# Patient Record
Sex: Male | Born: 1950 | Race: Black or African American | Hispanic: No | Marital: Single | Smoking: Former smoker
Health system: Southern US, Community
[De-identification: ages and names within clinical notes are randomized; demographics above are authoritative.]

## PROBLEM LIST (undated history)

## (undated) DIAGNOSIS — E119 Type 2 diabetes mellitus without complications: Secondary | ICD-10-CM

## (undated) DIAGNOSIS — J449 Chronic obstructive pulmonary disease, unspecified: Secondary | ICD-10-CM

## (undated) DIAGNOSIS — F1010 Alcohol abuse, uncomplicated: Secondary | ICD-10-CM

## (undated) DIAGNOSIS — I10 Essential (primary) hypertension: Secondary | ICD-10-CM

## (undated) DIAGNOSIS — I509 Heart failure, unspecified: Secondary | ICD-10-CM

## (undated) DIAGNOSIS — F17200 Nicotine dependence, unspecified, uncomplicated: Secondary | ICD-10-CM

## (undated) DIAGNOSIS — I219 Acute myocardial infarction, unspecified: Secondary | ICD-10-CM

## (undated) DIAGNOSIS — R0602 Shortness of breath: Secondary | ICD-10-CM

## (undated) DIAGNOSIS — I739 Peripheral vascular disease, unspecified: Secondary | ICD-10-CM

## (undated) DIAGNOSIS — I499 Cardiac arrhythmia, unspecified: Secondary | ICD-10-CM

## (undated) DIAGNOSIS — I639 Cerebral infarction, unspecified: Secondary | ICD-10-CM

## (undated) DIAGNOSIS — I4891 Unspecified atrial fibrillation: Secondary | ICD-10-CM

## (undated) DIAGNOSIS — B1920 Unspecified viral hepatitis C without hepatic coma: Secondary | ICD-10-CM

## (undated) HISTORY — PX: LEG AMPUTATION BELOW KNEE: SHX694

## (undated) HISTORY — PX: VASCULAR SURGERY: SHX849

## (undated) HISTORY — PX: LEG AMPUTATION ABOVE KNEE: SHX117

---

## 1997-11-26 ENCOUNTER — Other Ambulatory Visit: Admission: RE | Admit: 1997-11-26 | Discharge: 1997-11-26 | Payer: Self-pay | Admitting: Family Medicine

## 1997-12-11 ENCOUNTER — Other Ambulatory Visit: Admission: RE | Admit: 1997-12-11 | Discharge: 1997-12-11 | Payer: Self-pay | Admitting: Family Medicine

## 1998-01-01 ENCOUNTER — Other Ambulatory Visit: Admission: RE | Admit: 1998-01-01 | Discharge: 1998-01-01 | Payer: Self-pay

## 2001-01-17 ENCOUNTER — Emergency Department (HOSPITAL_COMMUNITY): Admission: EM | Admit: 2001-01-17 | Discharge: 2001-01-17 | Payer: Self-pay | Admitting: *Deleted

## 2004-02-26 ENCOUNTER — Ambulatory Visit: Payer: Self-pay | Admitting: Internal Medicine

## 2004-04-05 ENCOUNTER — Ambulatory Visit: Payer: Self-pay | Admitting: Internal Medicine

## 2004-04-07 ENCOUNTER — Emergency Department (HOSPITAL_COMMUNITY): Admission: EM | Admit: 2004-04-07 | Discharge: 2004-04-07 | Payer: Self-pay | Admitting: Emergency Medicine

## 2004-05-18 ENCOUNTER — Ambulatory Visit: Payer: Self-pay | Admitting: Internal Medicine

## 2004-06-02 ENCOUNTER — Ambulatory Visit: Payer: Self-pay | Admitting: Internal Medicine

## 2004-06-28 ENCOUNTER — Ambulatory Visit: Payer: Self-pay | Admitting: Internal Medicine

## 2004-12-27 ENCOUNTER — Ambulatory Visit: Payer: Self-pay | Admitting: Internal Medicine

## 2005-01-03 ENCOUNTER — Ambulatory Visit: Payer: Self-pay | Admitting: Internal Medicine

## 2005-01-11 ENCOUNTER — Ambulatory Visit: Payer: Self-pay | Admitting: *Deleted

## 2005-02-08 ENCOUNTER — Ambulatory Visit: Payer: Self-pay | Admitting: Internal Medicine

## 2005-03-01 ENCOUNTER — Ambulatory Visit: Payer: Self-pay | Admitting: Internal Medicine

## 2005-04-04 ENCOUNTER — Ambulatory Visit: Payer: Self-pay | Admitting: Internal Medicine

## 2005-09-08 ENCOUNTER — Ambulatory Visit: Payer: Self-pay | Admitting: Internal Medicine

## 2005-10-05 ENCOUNTER — Ambulatory Visit: Payer: Self-pay | Admitting: Internal Medicine

## 2005-10-18 ENCOUNTER — Ambulatory Visit: Payer: Self-pay | Admitting: Internal Medicine

## 2005-12-27 ENCOUNTER — Inpatient Hospital Stay (HOSPITAL_COMMUNITY): Admission: EM | Admit: 2005-12-27 | Discharge: 2005-12-30 | Payer: Self-pay | Admitting: Pediatrics

## 2005-12-27 ENCOUNTER — Encounter (INDEPENDENT_AMBULATORY_CARE_PROVIDER_SITE_OTHER): Payer: Self-pay | Admitting: Cardiology

## 2005-12-28 ENCOUNTER — Ambulatory Visit: Payer: Self-pay | Admitting: Internal Medicine

## 2006-01-03 ENCOUNTER — Ambulatory Visit: Payer: Self-pay | Admitting: Internal Medicine

## 2006-03-27 ENCOUNTER — Ambulatory Visit: Payer: Self-pay | Admitting: Internal Medicine

## 2006-03-27 ENCOUNTER — Ambulatory Visit: Payer: Self-pay | Admitting: Emergency Medicine

## 2006-03-27 ENCOUNTER — Ambulatory Visit: Payer: Self-pay | Admitting: Sports Medicine

## 2006-03-27 ENCOUNTER — Inpatient Hospital Stay (HOSPITAL_COMMUNITY): Admission: EM | Admit: 2006-03-27 | Discharge: 2006-04-05 | Payer: Self-pay | Admitting: Emergency Medicine

## 2006-03-28 ENCOUNTER — Encounter (INDEPENDENT_AMBULATORY_CARE_PROVIDER_SITE_OTHER): Payer: Self-pay | Admitting: *Deleted

## 2006-04-18 ENCOUNTER — Ambulatory Visit: Payer: Self-pay | Admitting: Internal Medicine

## 2006-05-02 ENCOUNTER — Ambulatory Visit: Payer: Self-pay | Admitting: Internal Medicine

## 2006-05-16 ENCOUNTER — Ambulatory Visit: Payer: Self-pay | Admitting: Internal Medicine

## 2006-05-19 ENCOUNTER — Ambulatory Visit: Payer: Self-pay | Admitting: Internal Medicine

## 2006-06-02 ENCOUNTER — Ambulatory Visit: Payer: Self-pay | Admitting: Internal Medicine

## 2006-06-06 HISTORY — PX: BELOW KNEE LEG AMPUTATION: SUR23

## 2006-06-06 HISTORY — PX: ABOVE KNEE LEG AMPUTATION: SUR20

## 2006-06-27 ENCOUNTER — Ambulatory Visit: Payer: Self-pay | Admitting: Internal Medicine

## 2006-07-07 ENCOUNTER — Ambulatory Visit: Payer: Self-pay | Admitting: Internal Medicine

## 2006-07-25 ENCOUNTER — Ambulatory Visit: Payer: Self-pay | Admitting: Internal Medicine

## 2006-11-22 DIAGNOSIS — F1010 Alcohol abuse, uncomplicated: Secondary | ICD-10-CM | POA: Insufficient documentation

## 2006-11-22 DIAGNOSIS — F17200 Nicotine dependence, unspecified, uncomplicated: Secondary | ICD-10-CM | POA: Insufficient documentation

## 2006-11-22 DIAGNOSIS — F1410 Cocaine abuse, uncomplicated: Secondary | ICD-10-CM | POA: Insufficient documentation

## 2006-11-22 DIAGNOSIS — I428 Other cardiomyopathies: Secondary | ICD-10-CM | POA: Insufficient documentation

## 2006-12-11 ENCOUNTER — Ambulatory Visit: Payer: Self-pay | Admitting: Internal Medicine

## 2006-12-11 ENCOUNTER — Inpatient Hospital Stay (HOSPITAL_COMMUNITY): Admission: EM | Admit: 2006-12-11 | Discharge: 2006-12-22 | Payer: Self-pay | Admitting: Emergency Medicine

## 2006-12-11 ENCOUNTER — Ambulatory Visit: Payer: Self-pay | Admitting: Cardiology

## 2006-12-12 ENCOUNTER — Encounter (INDEPENDENT_AMBULATORY_CARE_PROVIDER_SITE_OTHER): Payer: Self-pay | Admitting: Emergency Medicine

## 2006-12-13 ENCOUNTER — Encounter (INDEPENDENT_AMBULATORY_CARE_PROVIDER_SITE_OTHER): Payer: Self-pay | Admitting: Neurology

## 2006-12-30 ENCOUNTER — Emergency Department (HOSPITAL_COMMUNITY): Admission: EM | Admit: 2006-12-30 | Discharge: 2006-12-30 | Payer: Self-pay | Admitting: Emergency Medicine

## 2007-04-09 ENCOUNTER — Ambulatory Visit (HOSPITAL_COMMUNITY): Admission: RE | Admit: 2007-04-09 | Discharge: 2007-04-09 | Payer: Self-pay | Admitting: Internal Medicine

## 2007-04-30 ENCOUNTER — Ambulatory Visit: Payer: Self-pay | Admitting: Internal Medicine

## 2007-04-30 LAB — CONVERTED CEMR LAB
ALT: 41 units/L (ref 0–53)
Basophils Absolute: 0 10*3/uL (ref 0.0–0.1)
CO2: 24 meq/L (ref 19–32)
Cholesterol: 167 mg/dL (ref 0–200)
Creatinine, Ser: 0.82 mg/dL (ref 0.40–1.50)
Eosinophils Relative: 2 % (ref 0–5)
HCT: 39 % (ref 39.0–52.0)
Hemoglobin: 13 g/dL (ref 13.0–17.0)
Lymphocytes Relative: 27 % (ref 12–46)
MCHC: 33.3 g/dL (ref 30.0–36.0)
MCV: 89.4 fL (ref 78.0–100.0)
Monocytes Absolute: 1 10*3/uL (ref 0.1–1.0)
RDW: 14.1 % (ref 11.5–15.5)
Total Bilirubin: 0.4 mg/dL (ref 0.3–1.2)
Total CHOL/HDL Ratio: 4.4
Triglycerides: 185 mg/dL — ABNORMAL HIGH (ref <150)
VLDL: 37 mg/dL (ref 0–40)
Vit D, 1,25-Dihydroxy: 18 — ABNORMAL LOW (ref 30–89)

## 2007-05-16 ENCOUNTER — Encounter: Admission: RE | Admit: 2007-05-16 | Discharge: 2007-06-06 | Payer: Self-pay | Admitting: Internal Medicine

## 2007-06-08 ENCOUNTER — Encounter: Admission: RE | Admit: 2007-06-08 | Discharge: 2007-09-06 | Payer: Self-pay | Admitting: Internal Medicine

## 2007-09-13 ENCOUNTER — Ambulatory Visit: Payer: Self-pay | Admitting: Internal Medicine

## 2007-09-13 LAB — CONVERTED CEMR LAB
ALT: 32 units/L (ref 0–53)
AST: 22 units/L (ref 0–37)
Albumin: 4.1 g/dL (ref 3.5–5.2)
Basophils Absolute: 0 10*3/uL (ref 0.0–0.1)
Basophils Relative: 1 % (ref 0–1)
Calcium: 9.7 mg/dL (ref 8.4–10.5)
Chloride: 103 meq/L (ref 96–112)
MCHC: 33.7 g/dL (ref 30.0–36.0)
Neutro Abs: 3.6 10*3/uL (ref 1.7–7.7)
Neutrophils Relative %: 53 % (ref 43–77)
Platelets: 317 10*3/uL (ref 150–400)
Potassium: 3.9 meq/L (ref 3.5–5.3)
RDW: 14.3 % (ref 11.5–15.5)

## 2007-09-25 ENCOUNTER — Ambulatory Visit: Payer: Self-pay | Admitting: Internal Medicine

## 2007-10-02 ENCOUNTER — Ambulatory Visit: Payer: Self-pay | Admitting: Internal Medicine

## 2007-10-09 ENCOUNTER — Ambulatory Visit: Payer: Self-pay | Admitting: Internal Medicine

## 2007-11-06 ENCOUNTER — Ambulatory Visit: Payer: Self-pay | Admitting: Internal Medicine

## 2007-11-23 ENCOUNTER — Ambulatory Visit: Payer: Self-pay | Admitting: Internal Medicine

## 2007-11-23 LAB — CONVERTED CEMR LAB
Chloride: 104 meq/L (ref 96–112)
Creatinine, Ser: 0.74 mg/dL (ref 0.40–1.50)
Potassium: 4.1 meq/L (ref 3.5–5.3)

## 2008-06-12 ENCOUNTER — Ambulatory Visit: Payer: Self-pay | Admitting: Internal Medicine

## 2008-06-13 ENCOUNTER — Encounter (INDEPENDENT_AMBULATORY_CARE_PROVIDER_SITE_OTHER): Payer: Self-pay | Admitting: Internal Medicine

## 2008-06-13 LAB — CONVERTED CEMR LAB
AST: 24 units/L (ref 0–37)
Albumin: 4 g/dL (ref 3.5–5.2)
Alkaline Phosphatase: 102 units/L (ref 39–117)
Calcium: 9.3 mg/dL (ref 8.4–10.5)
Chloride: 104 meq/L (ref 96–112)
Glucose, Bld: 161 mg/dL — ABNORMAL HIGH (ref 70–99)
Phenobarbital: <0.4 ug/mL — ABNORMAL LOW (ref 15.0–40.0)
Potassium: 4.6 meq/L (ref 3.5–5.3)
Sodium: 142 meq/L (ref 135–145)
Total Protein: 7.3 g/dL (ref 6.0–8.3)

## 2008-06-25 ENCOUNTER — Ambulatory Visit (HOSPITAL_COMMUNITY): Admission: RE | Admit: 2008-06-25 | Discharge: 2008-06-25 | Payer: Self-pay | Admitting: Internal Medicine

## 2008-06-25 ENCOUNTER — Ambulatory Visit: Payer: Self-pay | Admitting: Cardiovascular Disease

## 2008-06-25 ENCOUNTER — Encounter: Payer: Self-pay | Admitting: Internal Medicine

## 2008-07-30 ENCOUNTER — Ambulatory Visit: Payer: Self-pay | Admitting: Internal Medicine

## 2008-09-25 ENCOUNTER — Ambulatory Visit: Payer: Self-pay | Admitting: Internal Medicine

## 2008-10-03 ENCOUNTER — Emergency Department (HOSPITAL_COMMUNITY): Admission: EM | Admit: 2008-10-03 | Discharge: 2008-10-03 | Payer: Self-pay | Admitting: Emergency Medicine

## 2008-11-06 ENCOUNTER — Ambulatory Visit: Payer: Self-pay | Admitting: Internal Medicine

## 2008-11-12 ENCOUNTER — Encounter: Payer: Self-pay | Admitting: Internal Medicine

## 2008-11-12 ENCOUNTER — Ambulatory Visit: Payer: Self-pay | Admitting: Surgery

## 2008-11-12 ENCOUNTER — Ambulatory Visit (HOSPITAL_COMMUNITY): Admission: RE | Admit: 2008-11-12 | Discharge: 2008-11-12 | Payer: Self-pay | Admitting: Internal Medicine

## 2008-11-22 ENCOUNTER — Inpatient Hospital Stay (HOSPITAL_COMMUNITY): Admission: EM | Admit: 2008-11-22 | Discharge: 2008-12-01 | Payer: Self-pay | Admitting: Emergency Medicine

## 2008-11-25 ENCOUNTER — Encounter (INDEPENDENT_AMBULATORY_CARE_PROVIDER_SITE_OTHER): Payer: Self-pay | Admitting: Emergency Medicine

## 2008-12-03 IMAGING — CT CT ANGIO CHEST
3 of 5 series · 16 of 30 positions shown · IV contrast (omnipaque)
Comparison: none

CLINICAL DATA: Periods of apnea.  Currently on heparin.  History of congestive heart failure and hypertension.  Diminished cardiac function.
CT ANGIOGRAPHY OF CHEST WITH CONTRAST ? 12/15/06:
TECHNIQUE: Multidetector CT imaging of the chest was performed during bolus injection of intravenous contrast.  Multiplanar CT angiographic image reconstructions were generated to evaluate the vascular anatomy. 
Contrast:  100 cc Omnipaque 300 IV.

[Series 2: pe · axial · 0.70mm/px · z∈[-346,-74]mm · 10 of 274 slices shown]
[im 28/274  lung]
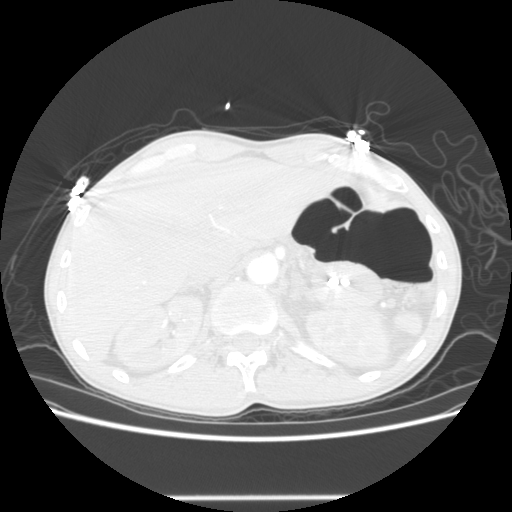
[im 55/274  mediastinal]
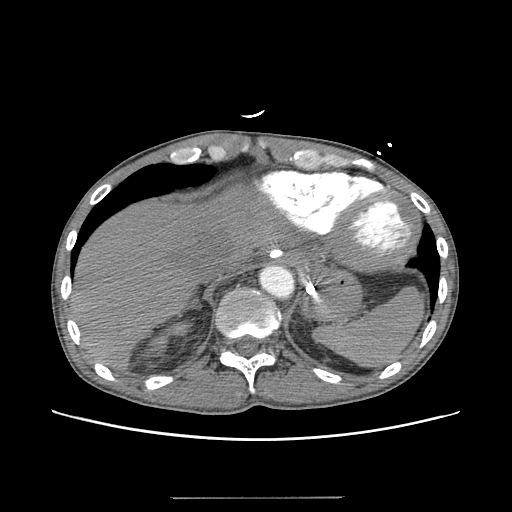
[im 82/274  lung]
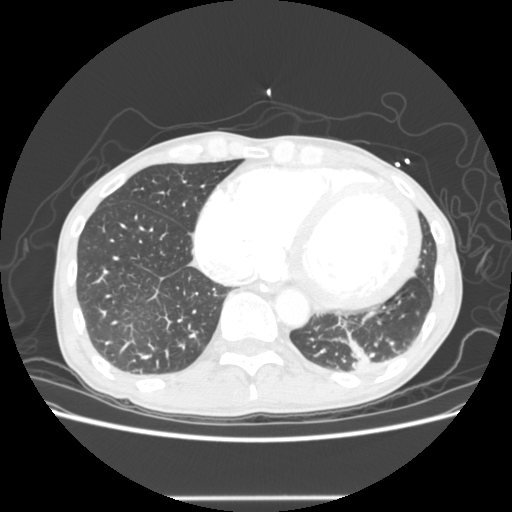
[im 110/274  mediastinal]
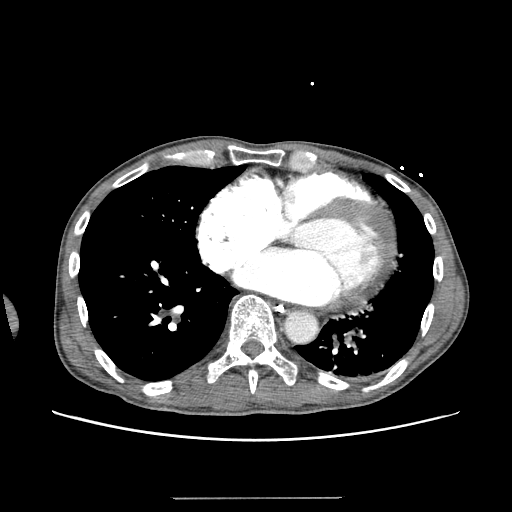
[im 134/274  lung]
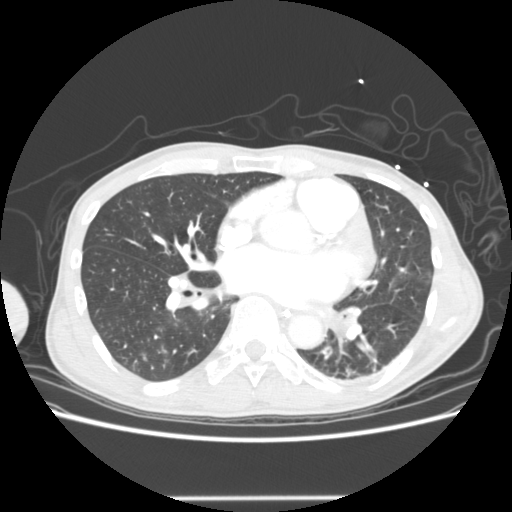
[im 137/274  mediastinal]
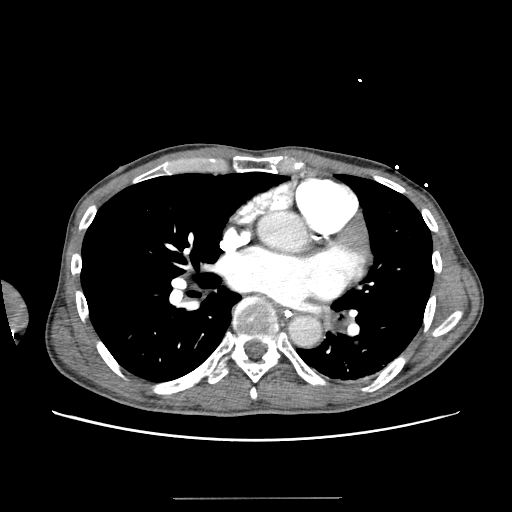
[im 164/274  lung]
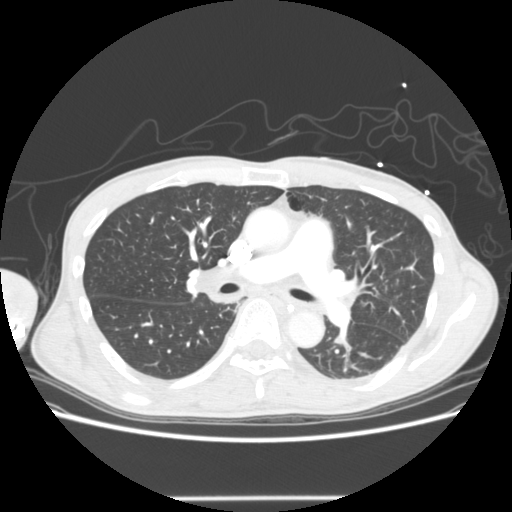
[im 192/274  mediastinal]
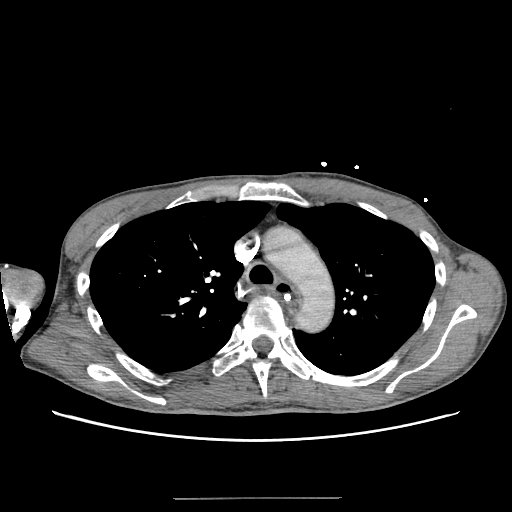
[im 219/274  lung]
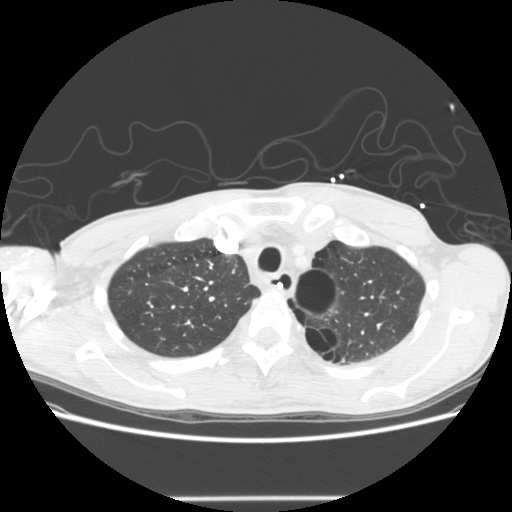
[im 246/274  mediastinal]
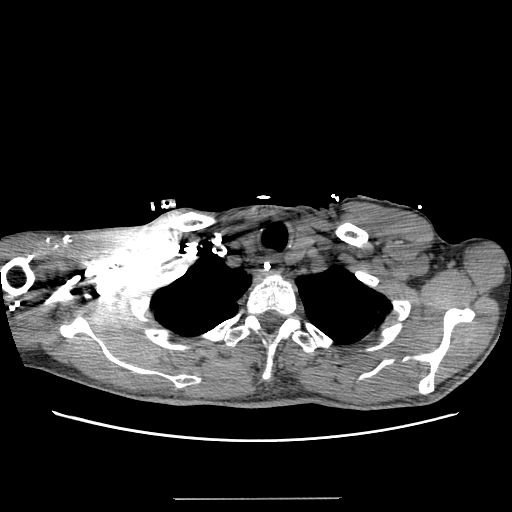

[Series 201: reformatted · sagittal · 0.70mm/px · 4 of 138 slices shown (1 of 2)]
[im 28/138  lung]
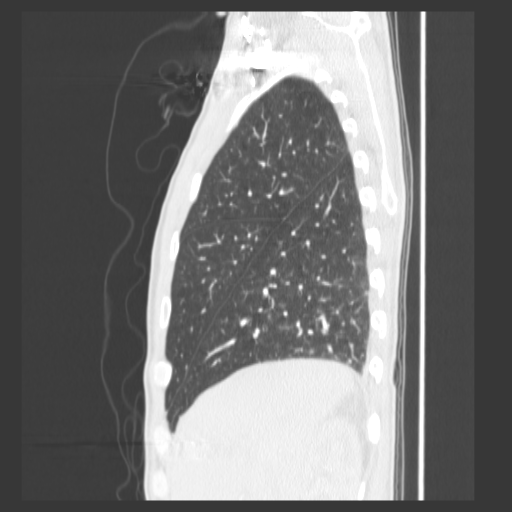
[im 55/138  lung]
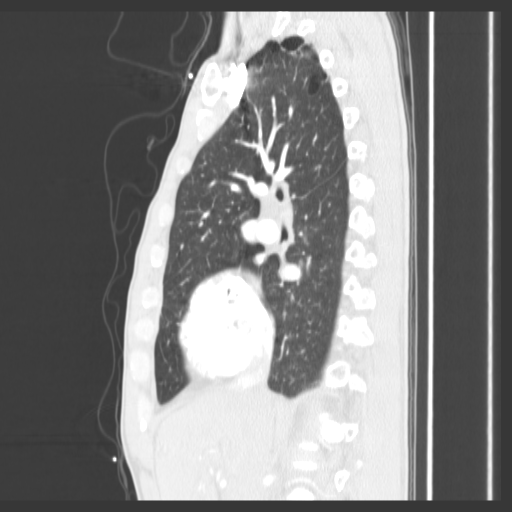
[im 83/138  lung]
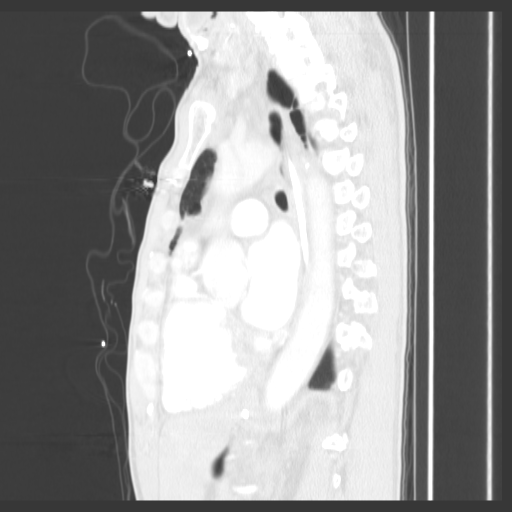
[im 110/138  lung]
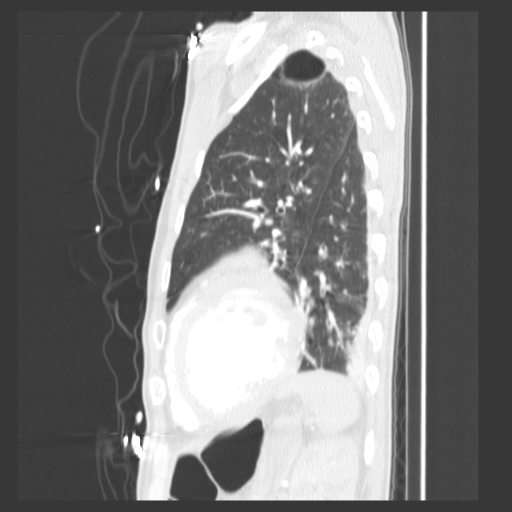

[Series 202: reformatted · coronal · 0.70mm/px · 2 of 92 slices shown (2 of 2)]
[im 31/92  lung]
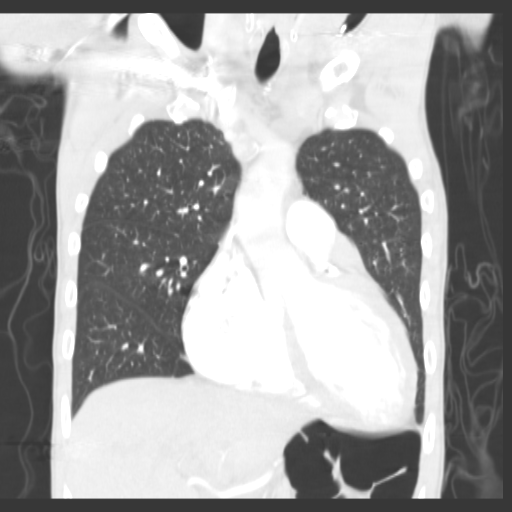
[im 61/92  lung]
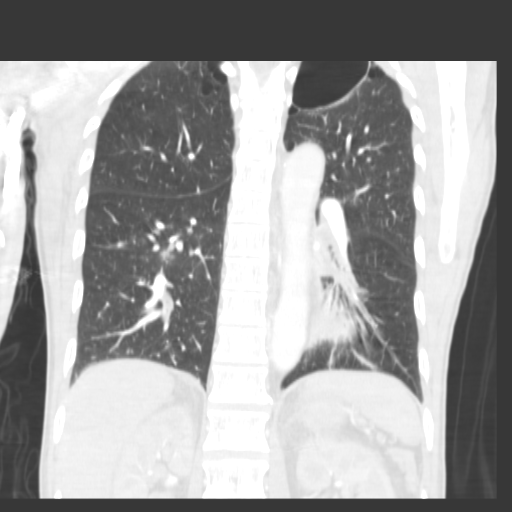

[16 of 30 positions shown; findings below may reference images not displayed]

FINDINGS: Negative for PE.  Prominent caliber of the main and central pulmonary arteries.  There appears to be enlargement of the right and left ventricle and atria.  A Panda feeding tube is noted traversing the esophagus.  There are calcified left coronary artery branches.  Bihilar adenopathy.  No pathologically enlarged mediastinal nodes.  There is left lower lobar peribronchial thickening extending down to the left base where there is a small parenchymal opacity most compatible with chronic atelectasis/consolidation.  There is minimal dilatation of the left lower lobar segmental bronchi, but no frank bronchiectasis.  COPD.  Multiple bullae at the apices.  There is a large 9.7 x 6.8 cm bulla at the medial left apex.  Thickening of the limbs of the adrenal glands compatible with hyperplasia.
IMPRESSION: Negative for PE.
Left lower lobar inflammatory changes with consolidation/atelectasis.  This may represent a chronic finding.
Cardiomegaly with multichamber enlargement.

## 2008-12-04 IMAGING — CT CT HEAD W/O CM
1 series · 16 of 30 positions shown, 20 images · IV contrast (agent unspecified)
Comparison: MRI and CT 12/12/2006 and 12/11/2006, respectively.

CLINICAL DATA: 56-year-old with CVA. History of Afib, CHF, diabetes, and polysubstance abuse. 
 HEAD CT WITHOUT CONTRAST:
TECHNIQUE: Contiguous axial images were obtained from the base of the skull through the vertex according to standard protocol without contrast.

[Series 1: — · axial · 0.49mm/px · z∈[-298,-118]mm · 16 of 40 slices shown, 20 images]
[im 2/40  brain]
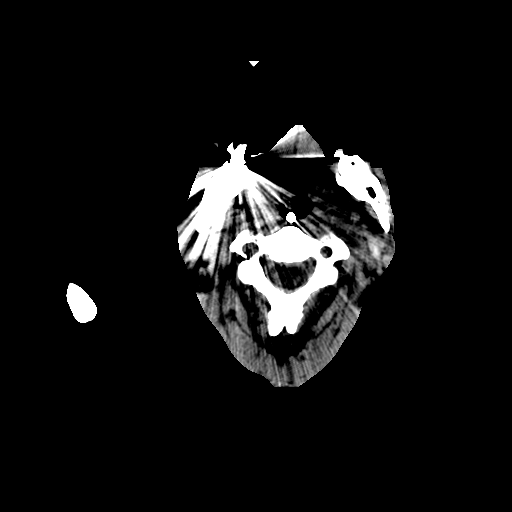
[im 2/40  bone]
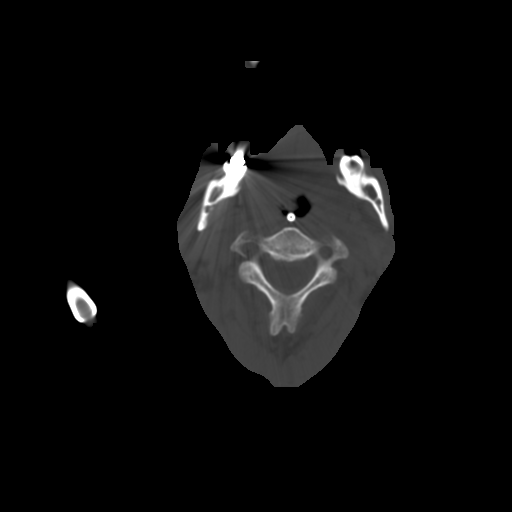
[im 5/40  brain]
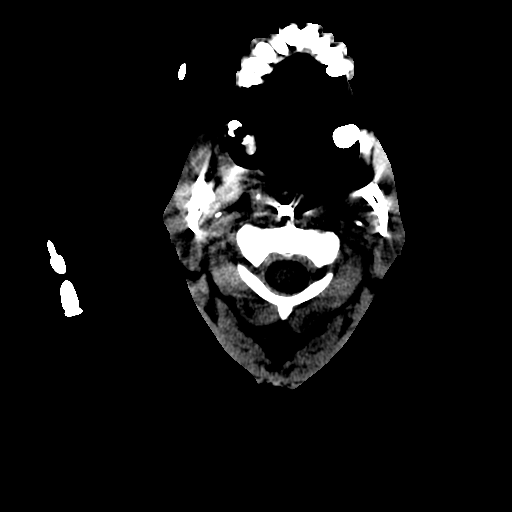
[im 7/40  brain]
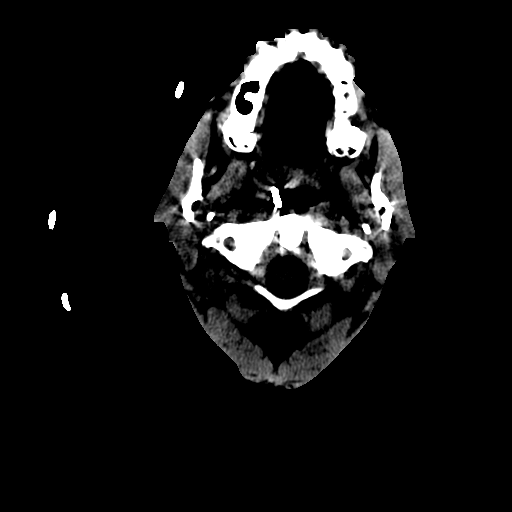
[im 9/40  brain]
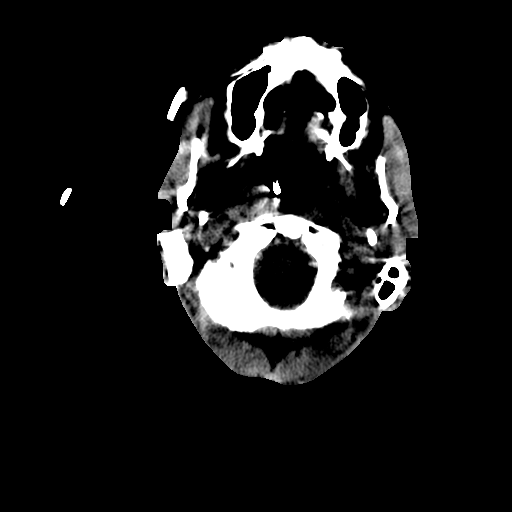
[im 11/40  brain]
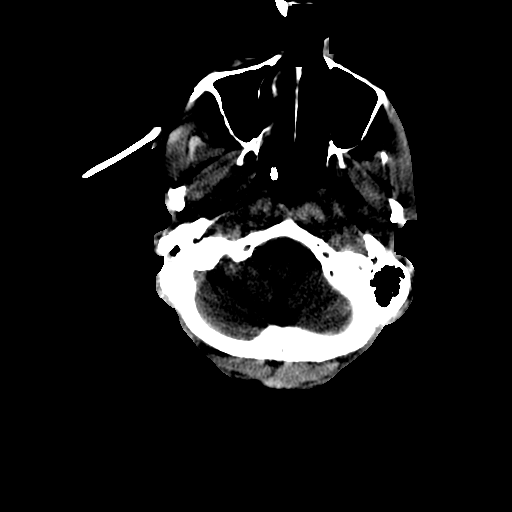
[im 11/40  bone]
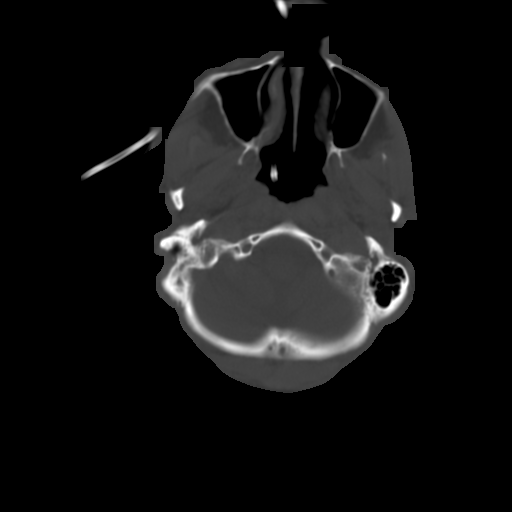
[im 14/40  brain]
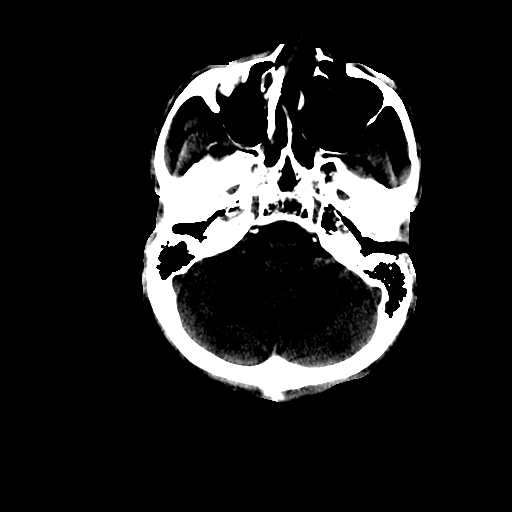
[im 15/40  brain]
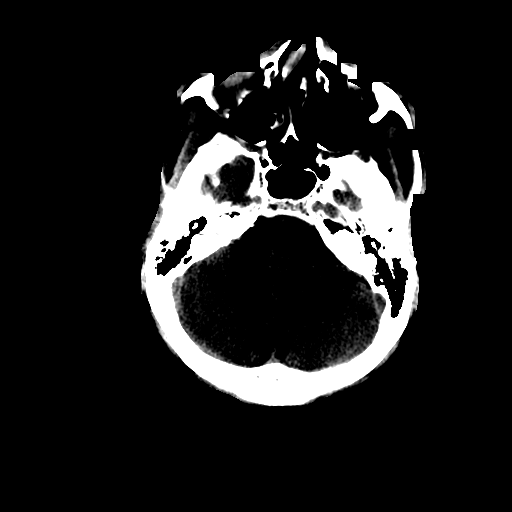
[im 18/40  brain]
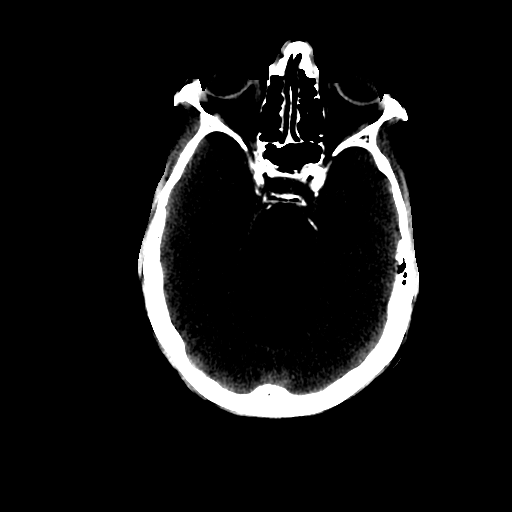
[im 21/40  brain]
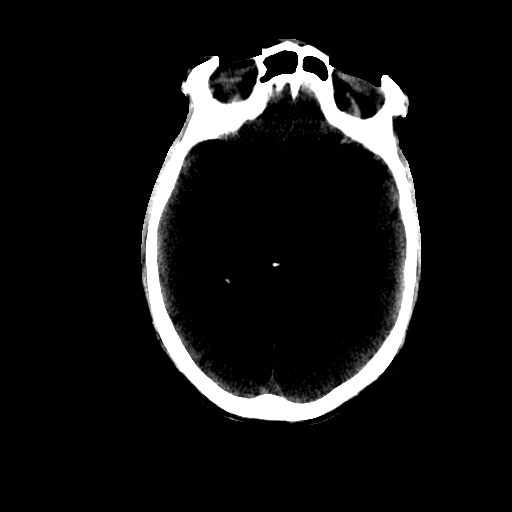
[im 21/40  bone]
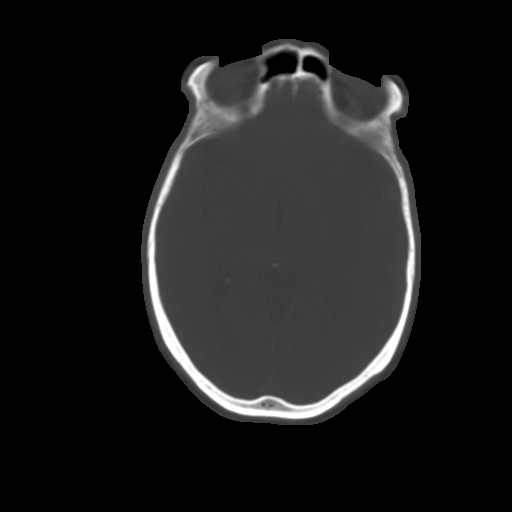
[im 25/40  brain]
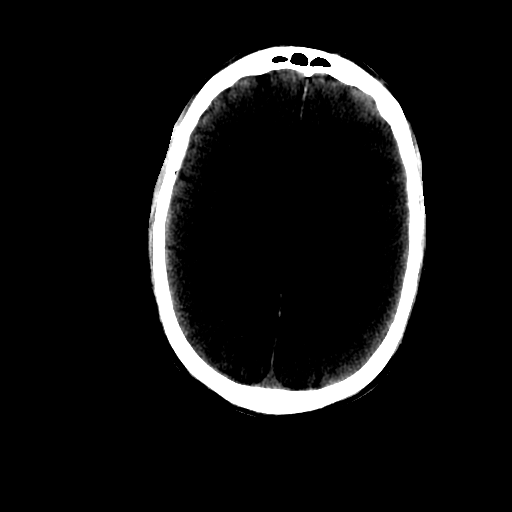
[im 26/40  brain]
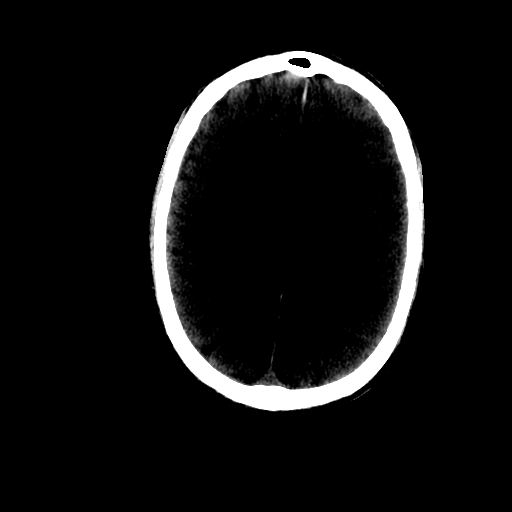
[im 29/40  brain]
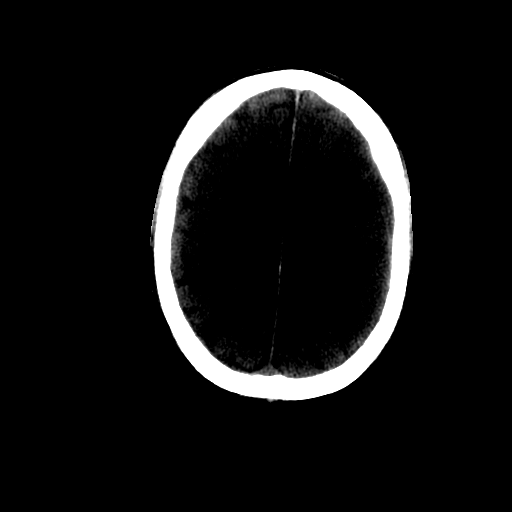
[im 31/40  brain]
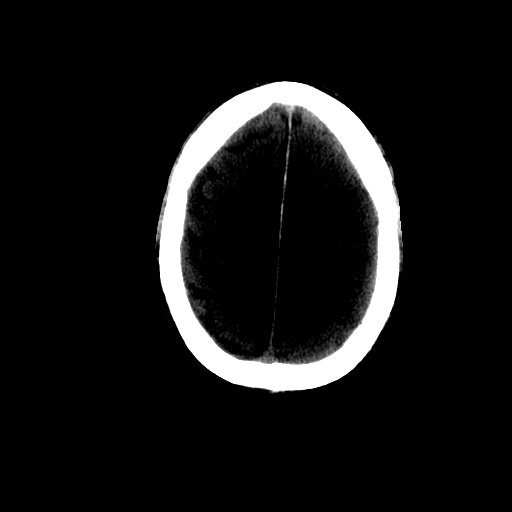
[im 31/40  bone]
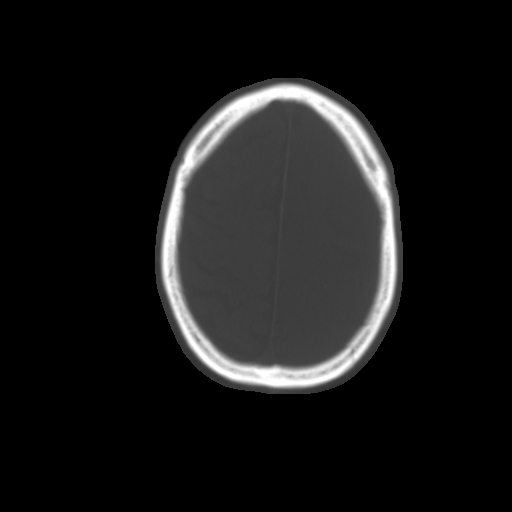
[im 33/40  brain]
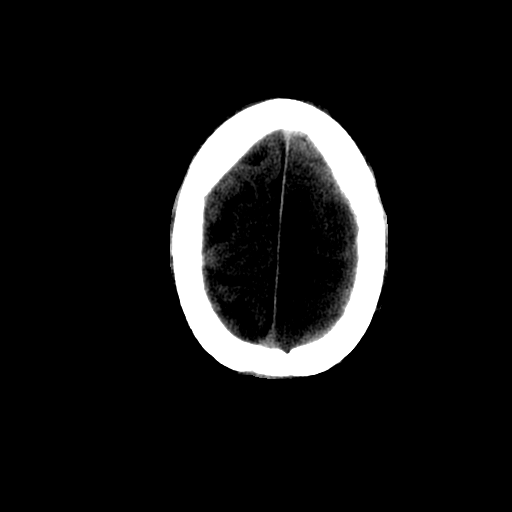
[im 35/40  brain]
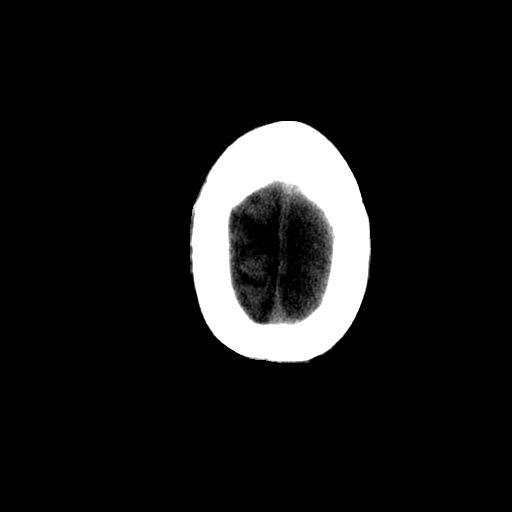
[im 38/40  brain]
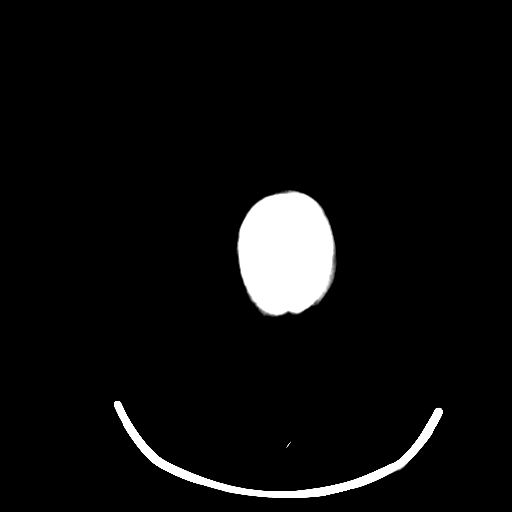

[16 of 30 positions shown; findings below may reference images not displayed]

FINDINGS: There is a large subacute infarct involving the left frontal and parietal lobes. There is sulcal effacement and significantly increased edema compared to the prior CT scan. Small left-to-right midline shift measures 3 mm. There is no evidence for hemorrhage.
IMPRESSION: Large subacute left MCA and ACA infarct.

## 2008-12-05 ENCOUNTER — Ambulatory Visit: Payer: Self-pay | Admitting: Vascular Surgery

## 2008-12-26 ENCOUNTER — Ambulatory Visit: Payer: Self-pay | Admitting: Vascular Surgery

## 2009-01-21 ENCOUNTER — Ambulatory Visit: Payer: Self-pay | Admitting: Internal Medicine

## 2009-01-23 ENCOUNTER — Ambulatory Visit: Payer: Self-pay | Admitting: Vascular Surgery

## 2009-01-27 ENCOUNTER — Inpatient Hospital Stay (HOSPITAL_COMMUNITY): Admission: RE | Admit: 2009-01-27 | Discharge: 2009-02-02 | Payer: Self-pay | Admitting: Vascular Surgery

## 2009-01-27 ENCOUNTER — Encounter: Payer: Self-pay | Admitting: Vascular Surgery

## 2009-01-27 ENCOUNTER — Ambulatory Visit: Payer: Self-pay | Admitting: Vascular Surgery

## 2009-01-29 ENCOUNTER — Ambulatory Visit: Payer: Self-pay | Admitting: Physical Medicine & Rehabilitation

## 2009-02-26 ENCOUNTER — Ambulatory Visit: Payer: Self-pay | Admitting: Internal Medicine

## 2009-03-06 ENCOUNTER — Ambulatory Visit: Payer: Self-pay | Admitting: Vascular Surgery

## 2009-03-26 ENCOUNTER — Ambulatory Visit: Payer: Self-pay | Admitting: Internal Medicine

## 2009-05-06 ENCOUNTER — Encounter: Admission: RE | Admit: 2009-05-06 | Discharge: 2009-06-03 | Payer: Self-pay | Admitting: Internal Medicine

## 2009-11-24 ENCOUNTER — Inpatient Hospital Stay (HOSPITAL_COMMUNITY): Admission: EM | Admit: 2009-11-24 | Discharge: 2009-11-27 | Payer: Self-pay | Admitting: Emergency Medicine

## 2009-12-31 ENCOUNTER — Ambulatory Visit: Payer: Self-pay | Admitting: Internal Medicine

## 2009-12-31 LAB — CONVERTED CEMR LAB
CO2: 25 meq/L (ref 19–32)
Calcium: 10.1 mg/dL (ref 8.4–10.5)
Creatinine, Ser: 0.72 mg/dL (ref 0.40–1.50)
Glucose, Bld: 153 mg/dL — ABNORMAL HIGH (ref 70–99)

## 2010-08-22 LAB — POCT I-STAT, CHEM 8
BUN: 27 mg/dL — ABNORMAL HIGH (ref 6–23)
Creatinine, Ser: 1.1 mg/dL (ref 0.4–1.5)
Sodium: 130 mEq/L — ABNORMAL LOW (ref 135–145)
TCO2: 18 mmol/L (ref 0–100)

## 2010-08-22 LAB — BASIC METABOLIC PANEL
BUN: 14 mg/dL (ref 6–23)
BUN: 18 mg/dL (ref 6–23)
CO2: 26 mEq/L (ref 19–32)
Calcium: 8.4 mg/dL (ref 8.4–10.5)
Calcium: 9 mg/dL (ref 8.4–10.5)
Chloride: 104 mEq/L (ref 96–112)
Chloride: 111 mEq/L (ref 96–112)
Creatinine, Ser: 0.8 mg/dL (ref 0.4–1.5)
GFR calc Af Amer: >60 mL/min (ref >60)
GFR calc Af Amer: >60 mL/min (ref >60)
GFR calc Af Amer: >60 mL/min (ref >60)
GFR calc non Af Amer: 57 mL/min — ABNORMAL LOW (ref >60)
GFR calc non Af Amer: >60 mL/min (ref >60)
GFR calc non Af Amer: >60 mL/min (ref >60)
GFR calc non Af Amer: >60 mL/min (ref >60)
Glucose, Bld: 121 mg/dL — ABNORMAL HIGH (ref 70–99)
Glucose, Bld: 713 mg/dL (ref 70–99)
Potassium: 3.3 mEq/L — ABNORMAL LOW (ref 3.5–5.1)
Potassium: 3.5 mEq/L (ref 3.5–5.1)
Potassium: 3.6 mEq/L (ref 3.5–5.1)
Potassium: 5.2 mEq/L — ABNORMAL HIGH (ref 3.5–5.1)
Sodium: 132 mEq/L — ABNORMAL LOW (ref 135–145)
Sodium: 136 mEq/L (ref 135–145)
Sodium: 140 mEq/L (ref 135–145)
Sodium: 140 mEq/L (ref 135–145)

## 2010-08-22 LAB — URINALYSIS, ROUTINE W REFLEX MICROSCOPIC
Bilirubin Urine: NEGATIVE
Glucose, UA: >1000 mg/dL — AB
Ketones, ur: 15 mg/dL — AB
pH: 5 (ref 5.0–8.0)

## 2010-08-22 LAB — LIPID PANEL
Cholesterol: 143 mg/dL (ref 0–200)
HDL: 33 mg/dL — ABNORMAL LOW (ref >39)

## 2010-08-22 LAB — CBC
HCT: 59.5 % — ABNORMAL HIGH (ref 39.0–52.0)
Hemoglobin: 20 g/dL — ABNORMAL HIGH (ref 13.0–17.0)
Platelets: 132 10*3/uL — ABNORMAL LOW (ref 150–400)
Platelets: 133 10*3/uL — ABNORMAL LOW (ref 150–400)
RBC: 5.75 MIL/uL (ref 4.22–5.81)
RBC: 6.53 MIL/uL — ABNORMAL HIGH (ref 4.22–5.81)
WBC: 11.9 10*3/uL — ABNORMAL HIGH (ref 4.0–10.5)
WBC: 7.9 10*3/uL (ref 4.0–10.5)

## 2010-08-22 LAB — CARDIAC PANEL(CRET KIN+CKTOT+MB+TROPI)
CK, MB: 0.7 ng/mL (ref 0.3–4.0)
CK, MB: 1 ng/mL (ref 0.3–4.0)
Relative Index: INVALID (ref 0.0–2.5)
Total CK: 30 U/L (ref 7–232)
Troponin I: 0.02 ng/mL (ref 0.00–0.06)
Troponin I: 0.02 ng/mL (ref 0.00–0.06)
Troponin I: 0.03 ng/mL (ref 0.00–0.06)

## 2010-08-22 LAB — GLUCOSE, CAPILLARY
Glucose-Capillary: 101 mg/dL — ABNORMAL HIGH (ref 70–99)
Glucose-Capillary: 110 mg/dL — ABNORMAL HIGH (ref 70–99)
Glucose-Capillary: 120 mg/dL — ABNORMAL HIGH (ref 70–99)
Glucose-Capillary: 129 mg/dL — ABNORMAL HIGH (ref 70–99)
Glucose-Capillary: 151 mg/dL — ABNORMAL HIGH (ref 70–99)
Glucose-Capillary: 181 mg/dL — ABNORMAL HIGH (ref 70–99)
Glucose-Capillary: 214 mg/dL — ABNORMAL HIGH (ref 70–99)
Glucose-Capillary: 224 mg/dL — ABNORMAL HIGH (ref 70–99)
Glucose-Capillary: 247 mg/dL — ABNORMAL HIGH (ref 70–99)
Glucose-Capillary: 252 mg/dL — ABNORMAL HIGH (ref 70–99)
Glucose-Capillary: 269 mg/dL — ABNORMAL HIGH (ref 70–99)
Glucose-Capillary: 354 mg/dL — ABNORMAL HIGH (ref 70–99)
Glucose-Capillary: 586 mg/dL (ref 70–99)
Glucose-Capillary: >600 mg/dL (ref 70–99)

## 2010-08-22 LAB — POCT I-STAT 3, ART BLOOD GAS (G3+)
O2 Saturation: 97 %
pCO2 arterial: 40.2 mmHg (ref 35.0–45.0)
pO2, Arterial: 95 mmHg (ref 80.0–100.0)

## 2010-08-22 LAB — DRUGS OF ABUSE SCREEN W/O ALC, ROUTINE URINE
Barbiturate Quant, Ur: NEGATIVE
Cocaine Metabolites: NEGATIVE
Creatinine,U: 111.1 mg/dL
Marijuana Metabolite: NEGATIVE
Methadone: NEGATIVE

## 2010-08-22 LAB — URINE CULTURE: Colony Count: NO GROWTH

## 2010-08-22 LAB — DIFFERENTIAL
Eosinophils Relative: 1 % (ref 0–5)
Lymphocytes Relative: 21 % (ref 12–46)
Lymphs Abs: 1.7 10*3/uL (ref 0.7–4.0)
Monocytes Absolute: 0.5 10*3/uL (ref 0.1–1.0)

## 2010-08-22 LAB — HEMOGLOBIN A1C: Hgb A1c MFr Bld: 15.7 % — ABNORMAL HIGH (ref <5.7)

## 2010-08-22 LAB — MRSA PCR SCREENING: MRSA by PCR: NEGATIVE

## 2010-08-22 LAB — URINE MICROSCOPIC-ADD ON

## 2010-08-22 LAB — MAGNESIUM: Magnesium: 1.9 mg/dL (ref 1.5–2.5)

## 2010-09-11 LAB — GLUCOSE, CAPILLARY
Glucose-Capillary: 105 mg/dL — ABNORMAL HIGH (ref 70–99)
Glucose-Capillary: 106 mg/dL — ABNORMAL HIGH (ref 70–99)
Glucose-Capillary: 130 mg/dL — ABNORMAL HIGH (ref 70–99)
Glucose-Capillary: 133 mg/dL — ABNORMAL HIGH (ref 70–99)
Glucose-Capillary: 140 mg/dL — ABNORMAL HIGH (ref 70–99)
Glucose-Capillary: 148 mg/dL — ABNORMAL HIGH (ref 70–99)
Glucose-Capillary: 157 mg/dL — ABNORMAL HIGH (ref 70–99)
Glucose-Capillary: 159 mg/dL — ABNORMAL HIGH (ref 70–99)
Glucose-Capillary: 172 mg/dL — ABNORMAL HIGH (ref 70–99)
Glucose-Capillary: 186 mg/dL — ABNORMAL HIGH (ref 70–99)
Glucose-Capillary: 186 mg/dL — ABNORMAL HIGH (ref 70–99)
Glucose-Capillary: 99 mg/dL (ref 70–99)

## 2010-09-11 LAB — BASIC METABOLIC PANEL
BUN: 3 mg/dL — ABNORMAL LOW (ref 6–23)
BUN: 5 mg/dL — ABNORMAL LOW (ref 6–23)
CO2: 26 mEq/L (ref 19–32)
Calcium: 8.6 mg/dL (ref 8.4–10.5)
Calcium: 9 mg/dL (ref 8.4–10.5)
Creatinine, Ser: 0.56 mg/dL (ref 0.4–1.5)
GFR calc Af Amer: >60 mL/min (ref >60)
GFR calc Af Amer: >60 mL/min (ref >60)
GFR calc non Af Amer: >60 mL/min (ref >60)
GFR calc non Af Amer: >60 mL/min (ref >60)
GFR calc non Af Amer: >60 mL/min (ref >60)
Potassium: 3.1 mEq/L — ABNORMAL LOW (ref 3.5–5.1)
Potassium: 3.5 mEq/L (ref 3.5–5.1)
Sodium: 127 mEq/L — ABNORMAL LOW (ref 135–145)

## 2010-09-11 LAB — CBC
HCT: 40.9 % (ref 39.0–52.0)
Hemoglobin: 11.7 g/dL — ABNORMAL LOW (ref 13.0–17.0)
Platelets: 267 10*3/uL (ref 150–400)
Platelets: 323 10*3/uL (ref 150–400)
RBC: 3.75 MIL/uL — ABNORMAL LOW (ref 4.22–5.81)
WBC: 13.2 10*3/uL — ABNORMAL HIGH (ref 4.0–10.5)
WBC: 9.5 10*3/uL (ref 4.0–10.5)

## 2010-09-13 LAB — CBC
HCT: 42.8 % (ref 39.0–52.0)
HCT: 47 % (ref 39.0–52.0)
HCT: 48.7 % (ref 39.0–52.0)
Hemoglobin: 14.2 g/dL (ref 13.0–17.0)
Hemoglobin: 14.7 g/dL (ref 13.0–17.0)
Hemoglobin: 15.8 g/dL (ref 13.0–17.0)
Hemoglobin: 16.5 g/dL (ref 13.0–17.0)
MCHC: 33.1 g/dL (ref 30.0–36.0)
MCHC: 34 g/dL (ref 30.0–36.0)
MCHC: 34.1 g/dL (ref 30.0–36.0)
MCHC: 34.1 g/dL (ref 30.0–36.0)
MCHC: 34.2 g/dL (ref 30.0–36.0)
MCV: 91.8 fL (ref 78.0–100.0)
MCV: 91.9 fL (ref 78.0–100.0)
MCV: 92 fL (ref 78.0–100.0)
MCV: 92.1 fL (ref 78.0–100.0)
MCV: 92.3 fL (ref 78.0–100.0)
MCV: 92.5 fL (ref 78.0–100.0)
MCV: 92.6 fL (ref 78.0–100.0)
Platelets: 279 10*3/uL (ref 150–400)
Platelets: 289 10*3/uL (ref 150–400)
Platelets: 295 10*3/uL (ref 150–400)
Platelets: 334 10*3/uL (ref 150–400)
RBC: 4.64 MIL/uL (ref 4.22–5.81)
RBC: 4.65 MIL/uL (ref 4.22–5.81)
RBC: 4.66 MIL/uL (ref 4.22–5.81)
RBC: 5.04 MIL/uL (ref 4.22–5.81)
RBC: 5.24 MIL/uL (ref 4.22–5.81)
RDW: 12.2 % (ref 11.5–15.5)
RDW: 12.4 % (ref 11.5–15.5)
RDW: 12.4 % (ref 11.5–15.5)
RDW: 12.7 % (ref 11.5–15.5)
RDW: 12.7 % (ref 11.5–15.5)
WBC: 10.3 10*3/uL (ref 4.0–10.5)
WBC: 12.4 10*3/uL — ABNORMAL HIGH (ref 4.0–10.5)
WBC: 12.5 10*3/uL — ABNORMAL HIGH (ref 4.0–10.5)
WBC: 15.1 10*3/uL — ABNORMAL HIGH (ref 4.0–10.5)

## 2010-09-13 LAB — TYPE AND SCREEN
ABO/RH(D): A POS
Antibody Screen: NEGATIVE

## 2010-09-13 LAB — URINE MICROSCOPIC-ADD ON

## 2010-09-13 LAB — DIFFERENTIAL
Basophils Absolute: 0 10*3/uL (ref 0.0–0.1)
Eosinophils Relative: 0 % (ref 0–5)
Lymphocytes Relative: 13 % (ref 12–46)
Monocytes Absolute: 1 10*3/uL (ref 0.1–1.0)
Monocytes Relative: 8 % (ref 3–12)

## 2010-09-13 LAB — GLUCOSE, CAPILLARY
Glucose-Capillary: 105 mg/dL — ABNORMAL HIGH (ref 70–99)
Glucose-Capillary: 110 mg/dL — ABNORMAL HIGH (ref 70–99)
Glucose-Capillary: 115 mg/dL — ABNORMAL HIGH (ref 70–99)
Glucose-Capillary: 125 mg/dL — ABNORMAL HIGH (ref 70–99)
Glucose-Capillary: 125 mg/dL — ABNORMAL HIGH (ref 70–99)
Glucose-Capillary: 126 mg/dL — ABNORMAL HIGH (ref 70–99)
Glucose-Capillary: 127 mg/dL — ABNORMAL HIGH (ref 70–99)
Glucose-Capillary: 132 mg/dL — ABNORMAL HIGH (ref 70–99)
Glucose-Capillary: 143 mg/dL — ABNORMAL HIGH (ref 70–99)
Glucose-Capillary: 146 mg/dL — ABNORMAL HIGH (ref 70–99)
Glucose-Capillary: 166 mg/dL — ABNORMAL HIGH (ref 70–99)
Glucose-Capillary: 170 mg/dL — ABNORMAL HIGH (ref 70–99)
Glucose-Capillary: 173 mg/dL — ABNORMAL HIGH (ref 70–99)
Glucose-Capillary: 174 mg/dL — ABNORMAL HIGH (ref 70–99)
Glucose-Capillary: 175 mg/dL — ABNORMAL HIGH (ref 70–99)
Glucose-Capillary: 191 mg/dL — ABNORMAL HIGH (ref 70–99)
Glucose-Capillary: 270 mg/dL — ABNORMAL HIGH (ref 70–99)
Glucose-Capillary: 281 mg/dL — ABNORMAL HIGH (ref 70–99)

## 2010-09-13 LAB — BASIC METABOLIC PANEL
BUN: 6 mg/dL (ref 6–23)
CO2: 23 mEq/L (ref 19–32)
CO2: 26 mEq/L (ref 19–32)
CO2: 28 mEq/L (ref 19–32)
CO2: 29 mEq/L (ref 19–32)
Calcium: 8.9 mg/dL (ref 8.4–10.5)
Calcium: 9.1 mg/dL (ref 8.4–10.5)
Chloride: 101 mEq/L (ref 96–112)
Chloride: 102 mEq/L (ref 96–112)
Chloride: 99 mEq/L (ref 96–112)
Creatinine, Ser: 0.62 mg/dL (ref 0.4–1.5)
Creatinine, Ser: 0.69 mg/dL (ref 0.4–1.5)
Creatinine, Ser: 0.76 mg/dL (ref 0.4–1.5)
GFR calc Af Amer: >60 mL/min (ref >60)
GFR calc Af Amer: >60 mL/min (ref >60)
GFR calc Af Amer: >60 mL/min (ref >60)
GFR calc Af Amer: >60 mL/min (ref >60)
GFR calc Af Amer: >60 mL/min (ref >60)
GFR calc non Af Amer: >60 mL/min (ref >60)
GFR calc non Af Amer: >60 mL/min (ref >60)
GFR calc non Af Amer: >60 mL/min (ref >60)
Glucose, Bld: 172 mg/dL — ABNORMAL HIGH (ref 70–99)
Glucose, Bld: 177 mg/dL — ABNORMAL HIGH (ref 70–99)
Glucose, Bld: 185 mg/dL — ABNORMAL HIGH (ref 70–99)
Glucose, Bld: 366 mg/dL — ABNORMAL HIGH (ref 70–99)
Potassium: 3 mEq/L — ABNORMAL LOW (ref 3.5–5.1)
Potassium: 3.7 mEq/L (ref 3.5–5.1)
Sodium: 132 mEq/L — ABNORMAL LOW (ref 135–145)
Sodium: 137 mEq/L (ref 135–145)
Sodium: 139 mEq/L (ref 135–145)

## 2010-09-13 LAB — COMPREHENSIVE METABOLIC PANEL
ALT: 13 U/L (ref 0–53)
ALT: 13 U/L (ref 0–53)
AST: 19 U/L (ref 0–37)
AST: 20 U/L (ref 0–37)
Albumin: 2.9 g/dL — ABNORMAL LOW (ref 3.5–5.2)
Albumin: 3.5 g/dL (ref 3.5–5.2)
CO2: 28 mEq/L (ref 19–32)
Calcium: 9.5 mg/dL (ref 8.4–10.5)
Chloride: 97 mEq/L (ref 96–112)
Creatinine, Ser: 0.72 mg/dL (ref 0.4–1.5)
Creatinine, Ser: 0.79 mg/dL (ref 0.4–1.5)
GFR calc Af Amer: >60 mL/min (ref >60)
GFR calc Af Amer: >60 mL/min (ref >60)
GFR calc non Af Amer: >60 mL/min (ref >60)
Potassium: 3.6 mEq/L (ref 3.5–5.1)
Sodium: 134 mEq/L — ABNORMAL LOW (ref 135–145)
Sodium: 136 mEq/L (ref 135–145)
Total Bilirubin: 1.6 mg/dL — ABNORMAL HIGH (ref 0.3–1.2)
Total Protein: 7.7 g/dL (ref 6.0–8.3)

## 2010-09-13 LAB — URINALYSIS, ROUTINE W REFLEX MICROSCOPIC
Leukocytes, UA: NEGATIVE
Nitrite: NEGATIVE
Nitrite: NEGATIVE
Specific Gravity, Urine: 1.013 (ref 1.005–1.030)
Specific Gravity, Urine: 1.028 (ref 1.005–1.030)
Urobilinogen, UA: 0.2 mg/dL (ref 0.0–1.0)
Urobilinogen, UA: 2 mg/dL — ABNORMAL HIGH (ref 0.0–1.0)
pH: 6 (ref 5.0–8.0)

## 2010-09-13 LAB — LACTIC ACID, PLASMA: Lactic Acid, Venous: 1.7 mmol/L (ref 0.5–2.2)

## 2010-09-13 LAB — RAPID URINE DRUG SCREEN, HOSP PERFORMED
Amphetamines: NOT DETECTED
Barbiturates: NOT DETECTED
Benzodiazepines: NOT DETECTED
Cocaine: NOT DETECTED
Opiates: POSITIVE — AB

## 2010-09-13 LAB — URIC ACID: Uric Acid, Serum: 5.7 mg/dL (ref 4.0–7.8)

## 2010-09-13 LAB — CULTURE, BLOOD (ROUTINE X 2): Culture: NO GROWTH

## 2010-09-13 LAB — HIV ANTIBODY (ROUTINE TESTING W REFLEX): HIV: NONREACTIVE

## 2010-09-13 LAB — APTT: aPTT: 33 seconds (ref 24–37)

## 2010-09-13 LAB — MAGNESIUM
Magnesium: 1.7 mg/dL (ref 1.5–2.5)
Magnesium: 1.8 mg/dL (ref 1.5–2.5)

## 2010-09-13 LAB — BRAIN NATRIURETIC PEPTIDE: Pro B Natriuretic peptide (BNP): 169 pg/mL — ABNORMAL HIGH (ref 0.0–100.0)

## 2010-09-13 LAB — URINE CULTURE

## 2010-09-13 LAB — DIGOXIN LEVEL: Digoxin Level: <0.2 ng/mL — ABNORMAL LOW (ref 0.8–2.0)

## 2010-10-03 ENCOUNTER — Emergency Department (HOSPITAL_COMMUNITY)
Admission: EM | Admit: 2010-10-03 | Discharge: 2010-10-03 | Disposition: A | Discharge status: Home / Self Care | Payer: PRIVATE HEALTH INSURANCE | Attending: Emergency Medicine | Admitting: Emergency Medicine

## 2010-10-03 ENCOUNTER — Emergency Department (HOSPITAL_COMMUNITY): Payer: PRIVATE HEALTH INSURANCE

## 2010-10-03 DIAGNOSIS — Z8679 Personal history of other diseases of the circulatory system: Secondary | ICD-10-CM | POA: Insufficient documentation

## 2010-10-03 DIAGNOSIS — R5381 Other malaise: Secondary | ICD-10-CM | POA: Insufficient documentation

## 2010-10-03 DIAGNOSIS — I428 Other cardiomyopathies: Secondary | ICD-10-CM | POA: Insufficient documentation

## 2010-10-03 DIAGNOSIS — I517 Cardiomegaly: Secondary | ICD-10-CM | POA: Insufficient documentation

## 2010-10-03 DIAGNOSIS — I10 Essential (primary) hypertension: Secondary | ICD-10-CM | POA: Insufficient documentation

## 2010-10-03 DIAGNOSIS — R059 Cough, unspecified: Secondary | ICD-10-CM | POA: Insufficient documentation

## 2010-10-03 DIAGNOSIS — E119 Type 2 diabetes mellitus without complications: Secondary | ICD-10-CM | POA: Insufficient documentation

## 2010-10-03 DIAGNOSIS — N390 Urinary tract infection, site not specified: Secondary | ICD-10-CM | POA: Insufficient documentation

## 2010-10-03 DIAGNOSIS — R5383 Other fatigue: Secondary | ICD-10-CM | POA: Insufficient documentation

## 2010-10-03 DIAGNOSIS — R569 Unspecified convulsions: Secondary | ICD-10-CM | POA: Insufficient documentation

## 2010-10-03 DIAGNOSIS — Z794 Long term (current) use of insulin: Secondary | ICD-10-CM | POA: Insufficient documentation

## 2010-10-03 DIAGNOSIS — I4891 Unspecified atrial fibrillation: Secondary | ICD-10-CM | POA: Insufficient documentation

## 2010-10-03 DIAGNOSIS — I509 Heart failure, unspecified: Secondary | ICD-10-CM | POA: Insufficient documentation

## 2010-10-03 DIAGNOSIS — R05 Cough: Secondary | ICD-10-CM | POA: Insufficient documentation

## 2010-10-03 LAB — BASIC METABOLIC PANEL
BUN: 9 mg/dL (ref 6–23)
Calcium: 9.1 mg/dL (ref 8.4–10.5)
Chloride: 103 mEq/L (ref 96–112)
Creatinine, Ser: 0.63 mg/dL (ref 0.4–1.5)
GFR calc Af Amer: >60 mL/min (ref >60)

## 2010-10-03 LAB — URINALYSIS, MICROSCOPIC ONLY
Bilirubin Urine: NEGATIVE
Ketones, ur: NEGATIVE mg/dL
Nitrite: POSITIVE — AB
Specific Gravity, Urine: 1.02 (ref 1.005–1.030)
pH: 5.5 (ref 5.0–8.0)

## 2010-10-03 LAB — DIFFERENTIAL
Basophils Absolute: 0 10*3/uL (ref 0.0–0.1)
Eosinophils Absolute: 0.3 10*3/uL (ref 0.0–0.7)
Lymphocytes Relative: 14 % (ref 12–46)
Monocytes Relative: 12 % (ref 3–12)
Neutro Abs: 9.3 10*3/uL — ABNORMAL HIGH (ref 1.7–7.7)
Neutrophils Relative %: 72 % (ref 43–77)

## 2010-10-03 LAB — GLUCOSE, RANDOM: Glucose, Bld: 95 mg/dL (ref 70–99)

## 2010-10-03 LAB — CBC
Hemoglobin: 16.6 g/dL (ref 13.0–17.0)
MCH: 30.7 pg (ref 26.0–34.0)
Platelets: 201 10*3/uL (ref 150–400)
RBC: 5.41 MIL/uL (ref 4.22–5.81)
WBC: 13 10*3/uL — ABNORMAL HIGH (ref 4.0–10.5)

## 2010-10-03 LAB — GLUCOSE, CAPILLARY
Glucose-Capillary: 107 mg/dL — ABNORMAL HIGH (ref 70–99)
Glucose-Capillary: 83 mg/dL (ref 70–99)

## 2010-10-03 LAB — ETHANOL: Alcohol, Ethyl (B): <5 mg/dL (ref 0–10)

## 2010-10-06 LAB — URINE CULTURE
Colony Count: >=100000
Culture  Setup Time: 201204300920

## 2010-10-19 NOTE — Assessment & Plan Note (Signed)
OFFICE VISIT   [name], [name]  DOB:  03-08-1951                                       12/26/2008  ZOXWR#:[id number]   The patient presents today for followup of his left above knee  amputation.  He presented to [facility] with gangrene of his  left foot with palpable popliteal pulse and unreconstructable tibial  disease.  He underwent uneventful left below knee amputation on  11/25/2008.  He did have a blister over the distal portion of his stump  on the first dressing change on postop day 2.  Fortunately this has  healed and his amputation site looks quite good and his staples will be  removed today.  He has a new concern with an eschar over his lateral  malleolus on the right.  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  He is contracted on the right as  well.  He does have palpable right popliteal pulse.   I have discussed this with the patient and his family present.  I  explained that there is no role for revascularization due to severe  tibial disease and also the fact that he has got a contracted right leg  with stroke.  I explained that he should continue his local wound care  that is currently being done at the nursing facility.  I will see him  again in 1 month for followup of his BKA and also followup of his  eschar.  He is certainly at risk for amputation on the right and he and  his family understand this.   Larina Earthly, M.D.  Electronically Signed   TFE/MEDQ  D:  12/26/2008  T:  12/27/2008  Job:  [id number]   cc:   Maxwell Caul, M.D.

## 2010-10-19 NOTE — Consult Note (Signed)
NAMESTARK, AGUINAGA             ACCOUNT NO.:  [id number]   MEDICAL RECORD NO.:  [id number]          PATIENT TYPE:  INP   LOCATION:  3107                         FACILITY:  [facility]   PHYSICIAN:  Shirley Friar, MDDATE OF BIRTH:  [birth year]   DATE OF CONSULTATION:  12/15/2006  DATE OF DISCHARGE:                                 CONSULTATION   We were asked to see Mr. [name] today in consultation for possible PEG  placement.  Consult was requested by Dr. Orlin Hilding on today's date, December 15, 2006.   HISTORY OF PRESENT ILLNESS:  This patient was admitted with a left CVA  on July 7th, is currently responsive only to painful stimuli and is  severely malnourished.  Note that there is no speech therapy evaluation  on the chart but due to the condition of this patient, speech evaluation  appears to be unnecessary.  This patient is unable to speak, will not  follow me with his eyes or follow simple commands.   PAST MEDICAL HISTORY:  1. Atrial fibrillation with a history of apical thrombus.  2. HIV.  3. Recent treatment for tuberculosis.  4. History of seizure.  5. History of polysubstance abuse including alcohol and crack cocaine.  6. Congestive heart failure with ejection fraction of 10 to 20%.  7. Medical noncompliance.   His primary care physicians are at [facility].  They are Drs. Yisroel Ramming and Donia Guiles.   CURRENT MEDICATIONS:  Prior to this hospitalization included digoxin,  Coumadin, Lasix, Coreg, lisinopril, trazodone, glipizide and Combivent  inhaler.   ALLERGIES:  He has no known drug allergies.   REVIEW OF SYSTEMS:  Unable to collect review of systems.   SOCIAL HISTORY:  Collected from the chart.  He is positive for alcohol  and drug use.  Lives with his niece.  His brother, Gaston Dase,  calls  and visits the hospital daily.  He is available at [phone].  The  patient reportedly has grown children.   FAMILY HISTORY:  Was not collected.   PHYSICAL  EXAMINATION:  The patient is awake.  His mouth is wide open.  His breathing sounds wet and labored.  Panda tube is in place.  His  abdomen is thin with no bowel sounds appreciated.  More firm in the  lower quadrants.  Appears tender to palpation as the patient covers his  abdomen when I attempt to palpate the lower quadrants.  His RN reports  that he had very active bowel sounds prior to today and has had no bowel  movements for several days.   CURRENT LABS:  Sodium is 135, potassium 5.9, chloride 104, bicarb 22,  BUN 18, creatinine 1, glucose 150.  Hemoglobin 15.9, hematocrit 47.3,  white count 12.1, platelets 169.  Abdominal x-ray showed Panda tube in  place as well as visible stool.   IMPRESSION:  The patient is unable to support his nutritional  requirements.  Percutaneous endoscopic gastrostomy recommended.  I have  spoken with the patient's brother, Benard Minturn, who  reports that he or  his older sister is able to give consent for  the patient.  Raiford Noble supports  the idea of a PEG if deemed medically necessary.  Risks of perforation,  infection and bleeding were explained to [place] and he understands.  Will  schedule PEG placement for Monday with Dr. Charlott Rakes.  Thanks  very much for this consultation.      Stephani Police, [place]      Shirley Friar, MD  Electronically Signed    MLY/MEDQ  D:  12/15/2006  T:  12/16/2006  Job:  [id number]   cc:   Tresa Endo L. Philipp Deputy, M.D.  Dineen Kid Reche Dixon, M.D.  Shirley Friar, MD  Gustavus Messing Orlin Hilding, M.D.

## 2010-10-19 NOTE — Assessment & Plan Note (Signed)
OFFICE VISIT   [name], [name]  DOB:  Aug 16, 1950                                       03/06/2009  ZOXWR#:[id number]   The patient presents today for followup of his right above knee  amputation on 01/27/2009.  He is status post left below knee amputation  in June of 2010.  He is here today with his sister who is his primary  caregiver.   He has good healing of both amputations.  His staples will be removed  today in the right leg and he will see [place] again on an as-needed basis.  They will notify us should he develop any new ulcerations.   Larina Earthly, M.D.  Electronically Signed   TFE/MEDQ  D:  03/06/2009  T:  03/07/2009  Job:  [id number]   cc:   Dineen Kid. Reche Dixon, M.D.

## 2010-10-19 NOTE — Discharge Summary (Signed)
[name]             ACCOUNT NO.:  [id number]   MEDICAL RECORD NO.:  [id number]          PATIENT TYPE:  INP   LOCATION:  5149                         FACILITY:  [facility]   PHYSICIAN:  Pramod P. Pearlean Brownie, MD    DATE OF BIRTH:  04-15-1951   DATE OF ADMISSION:  12/11/2006  DATE OF DISCHARGE:  12/22/2006                         DISCHARGE SUMMARY - REFERRING   DISCHARGE DIAGNOSES:  1. Large left hemispheric cardioembolic infarct felt to be secondary      to either low ejection fraction or atrial fibrillation.  2. Atrial fibrillation with history of apical thrombus.  3. Human immunodeficiency virus.  4. Recent treatment for tuberculosis.  5. History of seizure.  6. History of polysubstance abuse including alcohol and crack cocaine.  7. Cardiomyopathy with ejection fraction of 10-15%.  8. History of congestive heart failure.  9. History of medical noncompliance.  10.Dysphagia secondary to #1, status post G-tube placement by      radiology.   DISCHARGE MEDICATIONS:  1. Lovenox 50 mg subcu q.12h. x5 days or until INR therapeutic.  2. Coumadin 7.5 mg daily until increase in INR seen, then as per      pharmacy/physician.  3. Phenobarbital 60 mg b.i.d.  4. Cardizem 60 mg t.i.d.  5. MiraLax 17 g a day.  6. Lanoxin 0.125 mg a day.  7. Lasix 20 mg a day.  8. Glipizide 2.5 mg a day.  9. Jevity tube feedings 310 mL five times a day.  10.Free water 40 mL before and after each feeding five times a day.   STUDIES PERFORMED:  1. CT of the brain on admission shows no acute abnormality.  2. Chest x-ray on admission showed no acute findings.  3. MRI of the brain showed acute stroke affecting a large portion of      the left middle cerebral artery territory and some of the left      anterior cerebral artery territory.  Mild chronic small vessel      changes in the white matter of the right hemisphere.  Diminished      flow in the right ICA compared to the left. Petechial blood  products in the region of infarction.  Brain shows swelling and      early mass effect.  4. MRA of the head shows right internal carotid artery stenosis at      skull base and in the siphon.  This vessel only supplies only the      right middle cerebral artery territory because of an aplastic A1      segment on that side.  Left internal carotid artery supplies the      remainder of the anterior circulation.  No large vessel occlusion      is present.  There is mild narrowing in the middle cerebral artery      on the left as proximal to the bifurcation, question due to      embolus.  5. CT angiogram of the chest is negative for PE, does show left lower      lobe inflammatory changes with consolidation/atelectasis.  May be  chronic.  Cardiomegaly with multichamber enlargement.  6. G-tube placement by radiology December 19, 2006, without difficulty.  7. Attempted PEG tube placement by GI December 18, 2006, but unable to      adequately transilluminate the stomach.  8. Multiple abdominal x-rays verifying Panda tube placement.  9. EKG shows atrial fibrillation, left ventricular hypertrophy, ST and      T-wave abnormalities with prolonged QT.  10.A 2-D echocardiogram  shows EF 10-20%.  11.Carotid Doppler shows no ICA stenosis.  12.EEG shows left-sided slowing, no seizure.   LABORATORY STUDIES:  CBC the day of discharge shows wbc 11.2, otherwise  normal.  Chemistry with sodium 131, glucose 104, otherwise normal.  Coagulation studies with INR 1.1.  Liver function tests with albumin  3.1, AST 41, total bilirubin 1.8.  Hemoglobin A1c was 8.6.  Homocystine  17.9.  Cardiac enzymes with troponin I elevated 0.32, otherwise normal.  Cholesterol 156, triglycerides 78, HDL 47, LDL 93.  Urine drug screen  was positive for cocaine.  Alcohol level was 20.  Urinalysis last  performed on December 14, 2006, small leukocyte esterase, 3-6 white blood  cells, 7-10 red blood cells, rare bacteria.  Blood cultures  negative.  Respiratory cultures with abundant group B Strep isolated and S.  Agalactiae.   HISTORY OF PRESENT ILLNESS:  Mr. [name] is a 60 year old  African American male who is brought to the hospital by private  transportation.  The patient's friend accompanied him, stating the  patient lives with a niece who was not in the house.  He had last been  seen normal at 1 o'clock.  Ten minutes later, they heard a thump and  found him having fallen to the floor.  Code stroke was called in  the  emergency room.  Patient had right-sided hemiparesis and was aphasic.  He has a history of seizures and a low EF of 10-20% with polysubstance  abuse and atrial fibrillation.  He has a history of a known apical  thrombus in 1998.  Patient was recently treated for tuberculosis three  weeks ago with a variety of antibiotics.  There were no records  confirming this and she does not know where he was treated.  She thinks  he gets his primary care from Dr. Reche Dixon at [facility].  Patient was in  the window for IV TPA and full dose TPA was administered.  He was  admitted to the ICU for evaluation and follow-up stroke work-up.   HOSPITAL COURSE:  Prognosis was poor due to size of stroke and history  of atrial fibrillation and increase of further strokes.  IV heparin was  started per stroke protocol given risk of recurrent strokes.  He was  initially started on Panda tube feedings.  Patient remained lethargic in  the ICU for several days.  Cardiology was consulted to help with his  atrial fibrillation which had elevated rate.  Cardiology had followed  patient in the past and in the past patient had been a poor Coumadin  candidate.  However, given his large stroke and family's desire to  continue aggressive treatment which will end up with eventual nursing  home placement, it was decided that patient was a Coumadin candidate.  Coumadin was started once the patient was assessed to not be able to   swallow and had PEG placed.  GI was initially called but they were  unable to transilluminate stomach, therefore G-tube was placed in  radiology without difficulty.  In the hospital he has  been changed to  bolus tube feedings and is tolerating them well.  Coumadin has been  started.  INR on the day of discharge is 1.1.  Nursing home has agreed  to bridge Lovenox to Coumadin until therapeutic.  Goal INR is 2-3.  Patient did have some likely aspiration pneumonia in the hospital and  was placed on Zosyn for a total of seven days.  Family has agreed to  placement and patient is being transferred to skilled nursing facility  for continuation of care.   CONDITION ON DISCHARGE:  Patient alert, awake, aphasic, not talking, no  following commands, tracks visually, has dense right hemiparesis, moves  his left side without difficulty.  Heart rate is irregular, his breath  sounds are clear.   DISCHARGE PLAN:  1. Transfer to skilled nursing facility for continuation of PT, OT and      speech therapy.  2. Lovenox bridging to Coumadin with goal INR 2 to 3.  Will need daily      INR and CBC until therapeutic.  3. Bolus tube feedings.  4. Tight risk factor control.  5. Follow-up primary care at the nursing home.  6. Follow-up with Dr. Pearlean Brownie in two to three months as able.      Annie Main, N.P.    ______________________________  Sunny Schlein. Pearlean Brownie, MD    SB/MEDQ  D:  12/22/2006  T:  12/22/2006  Job:  [id number]   cc:   Nicholos Johns [facility]  Shirley Friar, MD  [name] Philipp Deputy, M.D.  Pricilla Riffle, MD, [facility]  Dineen Kid. Reche Dixon, M.D.

## 2010-10-19 NOTE — H&P (Signed)
[name], [name]             ACCOUNT NO.:  [id number]   MEDICAL RECORD NO.:  [id number]          PATIENT TYPE:  EMS   LOCATION:  MAJO                         FACILITY:  [facility]   PHYSICIAN:  Selena Batten, MD     DATE OF BIRTH:  July 02, 1950   DATE OF ADMISSION:  11/22/2008  DATE OF DISCHARGE:                              HISTORY & PHYSICAL   CHIEF COMPLAINT:  Left foot pain.   HISTORY OF PRESENT ILLNESS:  This is a 60 year-old [place] American  gentleman with a past medical history significant for congestive heart  failure with prior EFs of 10 to 20%, atrial fibrillation, a  cerebrovascular accident with residual  weakness and aphasia, diabetes  mellitus, hypertension, and prior seizure disorder as well as  noncompliance who presents for further evaluation and treatment of left  foot pain, swelling and fever.  These symptoms have been occurring over  the past 3 weeks.  His erythema of the left foot has increased in size  and intensity.  Of note, the toenail of the left big toe has fallen off.  Discoloration is seen throughout with some black digits.  He states that  he has been febrile at home and according to discussions between the ED  physician and the patient's family, he has been treated for the last 3  weeks by his PCP for possible gout.  He is brought in today for further  evaluation and treatment.  In the Emergency Department, a left foot x-  ray did not demonstrate any overt evidence of osteomyelitis. Of note,  this History and Physical is complicated by the fact that the patient is  aphasic but is able to respond to yes/no questions.   PAST MEDICAL HISTORY:  1. Congestive heart failure with ejection fraction of 10 to 20%.  2. Atrial fibrillation.  3. 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  4. Hypertension.  5. Diabetes mellitus.  6. Polysubstance abuse.  7. Medical noncompliance.   CURRENT MEDICATIONS:  The patient is unable to recall any of  his  medications and his family did not bring them with him and they are  presently not available.   ALLERGIES:  No known drug allergies that are known.   FAMILY HISTORY:  Mother died related to diabetes, lupus and a unknown  type of cancer.  Father died related to complications of diabetes  mellitus.   SOCIAL HISTORY:  The patient presently lives with family.  He continues  to smoke.  It is uncertain if he continues to drink.   REVIEW OF SYSTEMS:  Complicated by the patient's aphasic condition, but  attempted review of 14 systems and all are negative except for what are  mentioned above.   PHYSICAL EXAMINATION:  VITAL SIGNS:  Blood pressure 139/80, temperature  97.4, heart rate 115.  GENERAL:  In mild distress.  HEENT:  Head normocephalic, atraumatic.  Eyes - pupils equal, round,  reactive to light.  LUNGS:  Clear to auscultation bilaterally.  HEART:  Irregular and tachycardic with no overt murmurs, rubs or  gallops.  ABDOMEN:  Positive bowel sounds. Soft, nontender, nondistended.  EXTREMITIES:  The patient has marked erythema of the left lower  extremity with toenail of the great toe missing.  The patient does have  serosanguineous seepage. Patient has +1 pulses in his PT/femoral;  dopplerable pulses present at the dorsalis pedis bilaterally.  NEUROLOGIC:  The patient continues to have overt dysphagia as well as I  believe right-sided weakness.   LABORATORY DATA:  Evaluation today demonstrates lactic acid 1.7,  urinalysis is negative for evidence of overt infection.  Of note, he  does have proteinuria.  BMP - glucose 366, INR 1 and CBC shows white  blood cell count 12.4, H and H 16.5 and 48.7 with platelet count of 327.  He has had left foot imaging in the Emergency Department which did not  demonstrate any overt indications of osteomyelitis.   ASSESSMENT:  This is a 60 year-old [place] American gentleman with past  medical history significant for congestive heart failure,  atrial  fibrillation, diabetes mellitus, hypertension, cerebrovascular accident  with residual deficits who presents with left foot erythema, pain and  fever that has been occurring over the past 3 weeks.  1. Left foot pain.  Will start the patient on IV Zosyn.  Will obtain      blood cultures.  Will consider obtaining a CT of the left foot to      further delineate the anatomy.  Pending the results of these      workups, may consider discussions with vascular surgery. Of note,      it is difficult to determine the exact etiology of the left leg.      Most likely than not, it is infectious but cannot exclude embolic      phenomenon.  2. Congestive heart failure.  Will monitor and treat the patient      symptomatically.  Of note, the patient is presently tachycardic but      does not appear to be in overt failure.  As such, will start the      patient on IV metoprolol in an effort to try and slow his atrial      fibrillation down.  3. Atrial fibrillation.  The patient is not anticoagulated at home.  I      am uncertain if he is supposed to be on Coumadin or is not.  Will      start the patient on metoprolol in an effort to try to control his      rate.  Patient would benefit from long-term anticoagulation, but he      has difficulty with compliance.  4. Diabetes mellitus.  Will monitor and treat accordingly.  5. Cerebrovascular accident.  Will continue to monitor the patient for      any status changes.   DISPOSITION:  Pending further workup.      Selena Batten, MD  Electronically Signed     BS/MEDQ  D:  11/22/2008  T:  11/22/2008  Job:  [id number]

## 2010-10-19 NOTE — Procedures (Signed)
The patient is a 60 year old [place] American male with a birth is 17-Sep-1950, admitted on December 11, 2006, as  code stroke.  This EEG is  performed on December 12, 2006.   [name] presented yesterday as a acute cerebrovascular accident or  presumed stroke.  A CT was normal when the emergency room called the  code stroke.  The patient has been found down, had collapsed in his  home, the thump was heard at about 10 past 1:00 p.m.  He was in the  hospital within 30 minutes.  The patient presented with dense right  hemiparesis and global aphasia.  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   CURRENT MEDICATIONS:  The patient was suppose to be on Coumadin,  lisinopril, beta blocker and Lasix, but has a long history of  noncompliance with medications.  Indeed his INR to 1.   DESCRIPTION:  This 16 channel EEG recording is performed at the bedside  without photic stimulation or hyperventilation maneuvers.   A posterior dominant background of 8 Hz is seen over the right posterior  hemisphere. An organized background cannot be seen on the left, here the  background is the central and occipitally in the 5-6 Hz range, therefore  significantly slowed.  There is also amplitude suppression throughout  the left central hemisphere.  No epileptiform discharges are noted.   This is an abnormal EEG due to left-sided slowing, amplitude suppression  indicate an underlying anatomical abnormality.  No epileptiform  discharges were noted.  The slowing is persistent throughout the  recording.      Melvyn Novas, M.D.  Electronically Signed     ZO:XWRU  D:  12/12/2006 09:50:49  T:  12/12/2006 12:47:35  Job #:  [id number]   cc:   Pramod P. Pearlean Brownie, MD  Fax: [phone]

## 2010-10-19 NOTE — H&P (Signed)
NAME:  [name], [name] NO.:  [id number]   MEDICAL RECORD NO.:  [id number]          PATIENT TYPE:  EMS   LOCATION:  MAJO                         FACILITY:  [facility]   PHYSICIAN:  Melvyn Novas, M.D.  DATE OF BIRTH:  1951-02-03   DATE OF ADMISSION:  12/11/2006  DATE OF DISCHARGE:                              HISTORY & PHYSICAL   HISTORY OF PRESENT ILLNESS:  He was brought here by private  transportation.  The patient's friend accompanied the patient stating  that the patient lives with a niece, who was not in the house.  She had  seen him normal at about 1 o'clock and 10 minutes later heard a thump  and found him having fallen to the floor.  Again, the patient was here in the ER, announced as a code stroke, 30  minutes later EMS had already called at 1330.   HISTORY OF ILLNESS:  As above.  The patient suddenly collapsed and was  found to be right-sided weak and aphasic.  The patient has a history of  seizures, Diabetes, congestive heart failure with a residual EF between  10 and 20%, hypertension, polysubstance abuse and atrial fibrillation .  The A fib condition had in the past been associated with a known apical  thrombus and in 1998, he was admitted with an intraarterial thrombus.  The niece also states that the patient was, until 3 weeks ago, treated  for tuberculosis with a variety of antibiotics.  I have no hospital  records confirming this, and she does not know where he might have been  treated.  She thinks he gets all of health care from Dr. Philipp Deputy and Dr.  Reche Dixon at [facility].   Stroke risk factors for this patient are atrial fibrillation,  hypertension, polysubstance abuse diabetes mellitus, congestive heart  failure and noncompliance with medication.   CURRENT MEDICATIONS:  The niece brought a stack of medications bag.  The  patient is supposed to take Coumadin 5 mg a day.  He is on Carvedilol 25  mg 1-1/2 tablets twice a day, Lasix 20 mg in the  morning, digoxin and  125 mg in the morning, glipizide 2.5 mg at night extended release,  lisinopril 10 mg daily, Combivent 2 puffs p.r.n.   FAMILY HISTORY:  Positive for diabetes mellitus, lupus and cancer.  his  mother died of a malignancy, it is also not specified which one.  Father  had diabetes mellitus.  The patient had, on previous admissions, denied  having any siblings.  Again, the patient is completely mute and aphasic,  and I cannot interview him for these questions.   SOCIAL HISTORY:  He has a documented history of EtOH and substance  abuse.  His last admission was mentioning crack cocaine.  He is also a  smoker.   REVIEW OF SYSTEMS:  The patient is aphasic, hemiparetic on the right.  He has a very irregular heart beat, clearly atrial fibrillation with  tachycardia and a heart rate between 109 and 121.  There is a bruit over  his left carotid artery.  There is decreased air sounds in  the right  lower lobe, weakness throughout the right in both extremities, very  mild, however, weakness in the right face not involving the forehead.  Hematologic and lymphatic system seem to be intact.  Also the patient  has a history of diabetes mellitus.  He is definitely not obese.  He  does not have edema.  He appears very cachectic.   PHYSICAL EXAMINATION:  VITAL SIGNS:  Temperature 98 degrees Fahrenheit,  blood pressure of 158/80, heart rate is now 109, respiratory rate is 23,  O2 saturation on O2 via nasal cannula 2 liters is 96%, height and weight  were not measured.  HEENT:  The patient can move tongue and uvula in midline, can extend his  tongue, is mimicking basically my actions but cannot follow a strict  verbal command.  LUNGS:  Rales and rhonchi, right more than left.  NECK:  Shows no lymph node.  No goiter.  CARDIOVASCULAR:  Shows an irregular heart beat and S2 murmur. Patient is  restless, I can not detect a carotid bruit, have difficulties with the  left carotid  auscultation.  SKIN AND MUCOSA:  The patient must have had edema recently.  He has  dystrophic skin in both lower extremities.  However, there is no pitting  edema now.  ABDOMEN: Soft and nontender with normal bowel sounds.  NEUROLOGICAL:  Dominant is the right side hemiparesis and aphasia as  well as his nutritional status that I would call cachectic.   STUDIES:  NIH stroke scale is 19, modified Rankin scale is 2.  The  patient's stroke battery of laboratory results have not been processed.  It is now 14 hours and 30 minutes , more than  45 minutes after the  patient arrived and blood was drawn by code stroke protocol.   ASSESSMENT:  Depending on the INR, this patient should well be in the  window for TPA IV.  We will ask Cardiology to evaluate and see if we can place the patient  back on the medications he is supposed to take.  Rita Mentmier, our  stroke nurse, has already discussed with the niece what treatment  options we have, once the INR is returned as subtherapeutic which we  strongly expect given her description that her uncle is noncompliant.  Likely affected by a large vessel stroke or occlusion.  Risk and benefits of TPA were explained to her, as the patient himself  has no designated medical guardian and is not able to answer questions  or sign a medical release of information.  Suspect continous drug abuse, as the patient became diaphoretic,  tachycardic and shivering, suspect withdraw.   Neurologic examination in detail for this aphasic gentleman shows that  he has a normal level of consciousness but cannot answer questions but  can follow commands only but mimicking them.  Further, the patient was  able to follow with his gaze to the right, but did not notice finger  movements homonymously on the left side of his visual field, and he  seems to have a partial gaze to the right.  The patient has complete  hemianopsia by my examination.  He has a partial facial palsy,  not  involving the forehead on the right.  Motor examination for right arm  and right leg shows weakness and no effort against gravity.  Limb ataxia  could not be evaluated due to the weakness.  Sensory examination shows  that the patient withdraws to painful stimuli only on the left, but  seems to grimace on the right, so there is not a complete extinction.  The patient is globally aphasic and mute.  The patient is scoring an NIH  stroke scale of 19.   The code stroke evaluation with the help of Linus Orn and the phone  information from  family members as well as the obtainment of previous  medical records took 45 minutes.  The INR was finally available at  14.40hours. Tpa was given and patient remained inthe ER until a bed in  the ICU became available.3 hours later.      Melvyn Novas, M.D.  Electronically Signed     CD/MEDQ  D:  12/11/2006  T:  12/11/2006  Job:  [id number]   cc:   Pramod P. Pearlean Brownie, MD  Dineen Kid Reche Dixon, M.D.  [name]. Philipp Deputy, M.D.

## 2010-10-19 NOTE — Group Therapy Note (Signed)
[name], [name]             ACCOUNT NO.:  [id number]   MEDICAL RECORD NO.:  [id number]          PATIENT TYPE:  INP   LOCATION:  4712                         FACILITY:  [facility]   PHYSICIAN:  Elliot Cousin, M.D.    DATE OF BIRTH:  09/01/1950                                 PROGRESS NOTE   CURRENT PROBLEM LIST AND DIAGNOSES:  1. Severe ischemia with dry gangrene of the left foot, status post      left below-the-knee amputation by Dr. Gretta Began on November 25, 2008.  2. Stump bulla/blister developed following the operation.  3. Increasing leukocytosis; recent chest x-ray and urinalysis negative      for active signs of infection.  4. Chronic atrial fibrillation.  Coumadin therapy was discontinued      previously by the patient's primary care physician secondary to      maintenance issues.  Aspirin was started during this      hospitalization.  5. Severe cardiomyopathy with an ejection fraction of 10% to 20%.      Angiotensin-converting enzyme inhibitor therapy was restarted      during this hospitalization.  6. Hypomagnesemia, now repleted.  7. Type 2 diabetes mellitus with a hemoglobin A1c of 8.4.  8. 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  9. Incorrect report of human immunodeficiency virus disease per the      discharge summary in July 2008.  The patient had no prior history      of human immunodeficiency infection.  Human immunodeficiency virus      serology was ordered during this hospitalization, and it was      nonreactive.   DISPOSITION:  The patient  is not quite ready for hospital discharge yet.  When he is  clinically stable, he will be discharged to [facility]  Mrs. Bjorn Loser, the patient's sister and power of  attorney (telephone number [phone]) was in agreement.   CONSULTATIONS:  Gretta Began, MD   PROCEDURES PERFORMED:  1. Status post left below-the-knee amputation on November 25, 2008, per      Dr. Arbie Cookey.  2. Chest x-ray on November 27, 2008.  The results revealed cardiomegaly      without pulmonary edema.  3. Chest x-ray on November 24, 2008.  The results revealed COPD changes      but no acute pulmonary findings.  4. CT scan of the left lower extremity on November 22, 2008.  The results      revealed no specific findings of osteomyelitis or abscess in the      foot.  Probable cellulitis in the great toe.   HISTORY OF PRESENT ILLNESS:  The patient is a 60 year old man with a past medical history significant  for a previous left brain stroke with right-sided hemiparesis, chronic  atrial fibrillation, type 2 diabetes mellitus, and severe  cardiomyopathy.  He presented to the emergency department on November 22, 2008, with a chief complaint of left foot pain, swelling, and fever.  When he was evaluated in the emergency department, he  was noted to be  afebrile and hemodynamically stable.  An x-ray of his foot was ordered  in the emergency department, and it revealed no evidence for acute  abnormality.  His white blood cell count was elevated at 12.4.  He was  admitted for further evaluation and management.   For additional details, please see the dictated history and physical.   HOSPITAL COURSE:  1. LEFT FOOT GANGRENE, STATUS POST LEFT BKA, STUMP BLISTER, AND      LEUKOCYTOSIS.  Blood cultures were ordered at the time of the      initial hospital assessment; they have remained negative.  He was      started on empiric antibiotic treatment with Zosyn.  For further      evaluation, a CT scan of the left lower extremity was ordered      primarily to rule out an abscess or osteomyelitis.  The results of      the CT were not consistent with osteomyelitis or abscess.  Vascular      surgeon, Dr. Arbie Cookey, was consulted.  Given the appearance of the      foot, he recommended left BKA if the patient and family were in      agreement.  His sister, Mrs. Isaac Bliss agreed and gave consent.   Dr.      Arbie Cookey proceeded with the left BKA.  Following the operation, a      large blister developed on the stump.  Dr. Arbie Cookey and colleagues are      treating it conservatively for now.  We are awaiting his follow-up      assessment to see if the bulla needs to be excised.  For now, wound      care continues with dressing changes with 4 x 4's, Kerlix, and an      Ace wrap.  Over the past 48 hours, the patient's white blood cell      count has been increasing.  At the time of the initial hospital      assessment, his white blood cell count was 12.4.  It increased to      15.6 postoperatively.  Yesterday, it increased to 16.2.  Because of      the increasing leukocytosis, another urinalysis and chest x-ray      were ordered.  The urinalysis was essentially negative for      infection, and the chest x-ray revealed no signs of infiltrates.      The patient continues on Zosyn.  Vancomycin will be added to the      antibiotic regimen today for additional coverage of gram positive      organisms.  He has been afebrile for the past several days.  2. SEVERE CARDIOMYOPATHY.  The patient's cardiomyopathy is currently      stable.  There has been no sign of acute decompensated congestive      heart failure.  The patient is treated chronically with Lasix and      digoxin.  His blood digoxin level was assessed and found to be less      than 0.2.  This leads one to believe there has been noncompliance.      I discussed ACE inhibitor therapy with the patient's sister, Mrs.      Isaac Bliss as he was apparently not on one (however, he had been in      the past according to old records).  According to Mrs. Isaac Bliss, she      knew  of no known contraindications or previous adverse drug      reactions.  Therefore, lisinopril was started at 2.5 mg daily.  He      is being maintained on Lasix and digoxin.  3. TYPE 2 DIABETES MELLITUS.  The patient was started on treatment      with sliding scale NovoLog and Lantus  insulin.  Apparently, he had      been treated with glipizide in the past.  The glipizide was      continued with parameters.  His hemoglobin A1c was noted to be      elevated at 8.4.  Over the past 48 hours, his capillary blood      glucose has become more controlled.  4. CHRONIC ATRIAL FIBRILLATION.  According to Mrs. Isaac Bliss, Coumadin      was discontinued over a year ago by his primary care physician      because of difficulty with regulating the patient's INR.  However,      it appeared that the patient was not even receiving aspirin      according to Mrs. McClure.  Therefore, aspirin was started during      this hospitalization.  As above, digoxin was maintained for rate      control.  5. HISTORY OF PREVIOUS STROKE.  The patient has expressive aphasia and      right-sided hemiparesis.  As above, aspirin was started at 81 mg      daily.  He will need ongoing therapy in a skilled environment upon      discharge.  6. NO EVIDENCE OF HIV DISEASE.  Apparently, it was reported on a      discharge summary in 2008 that the      patient had acquired HIV.  This was felt to be a mis-type or      misprint.  The patient has no history of HIV according to the      family.  HIV serology was obtained during this hospitalization, and      it was nonreactive.      Elliot Cousin, M.D.  Electronically Signed     DF/MEDQ  D:  11/29/2008  T:  11/29/2008  Job:  [id number]   cc:   Larina Earthly, M.D.  [address]  [place]  [place] [address]

## 2010-10-19 NOTE — Op Note (Signed)
NAMELINELL, SHAWN             ACCOUNT NO.:  [id number]   MEDICAL RECORD NO.:  [id number]          PATIENT TYPE:  INP   LOCATION:  5149                         FACILITY:  [facility]   PHYSICIAN:  Shirley Friar, MDDATE OF BIRTH:  [birth year]   DATE OF PROCEDURE:  DATE OF DISCHARGE:                               OPERATIVE REPORT   PROCEDURE:  Upper endoscopy.   INDICATIONS:  Feeding difficulties, PEG tube placement.   MEDICATIONS:  Fentanyl 50 mcg IV, Versed 4 mg IV.   FINDINGS:  PEG tube not attempted due to inadequate transillumination of  abdominal wall.   PROCEDURE:  The endoscope was inserted into the oropharynx and esophagus  was intubated which was normal in its entirety.  The endoscope was  advanced into the stomach which was difficult to distend due to repeated  retching by the patient.  The endoscope retroflexed and revealed normal  proximal stomach.  The endoscope was straightened and no mucosal  abnormalities were seen.  The endoscope was advanced into the duodenal  bulb and second portion of duodenum which were both normal.  The  endoscope was withdrawn back to the stomach and despite repeated  attempts and distension of the stomach with air, inadequate  transillumination of the abdominal wall was noted preventing safe and  acceptable placement of PEG tube using endoscopy.  Therefore the  procedure was terminated.   ASSESSMENT:  1. Inadequate transillumination of abdominal wall with endoscopy      preventing PEG tube placement.  Recommend fluoroscopy by radiology for PEG tube insertion.  1. Panda tube was dislodged during the procedure and removed.  2. Resume heparin if radiology unable to do feeding tube today.      Shirley Friar, MD  Electronically Signed     VCS/MEDQ  D:  12/18/2006  T:  12/19/2006  Job:  [id number]

## 2010-10-19 NOTE — Consult Note (Signed)
NAMEETHRIDGE, SOLLENBERGER             ACCOUNT NO.:  [id number]   MEDICAL RECORD NO.:  [id number]          PATIENT TYPE:  INP   LOCATION:  3107                         FACILITY:  [facility]   PHYSICIAN:  Jesse Sans. Wall, MD, FACCDATE OF BIRTH:  Dec 02, 1950   DATE OF CONSULTATION:  12/13/2006  DATE OF DISCHARGE:                                 CONSULTATION   PRIMARY CARDIOLOGIST:  Dr. Dietrich Pates   PRIMARY CARE PHYSICIAN:  Dr. Philipp Deputy   REASON FOR CONSULTATION:  Atrial fibrillation with rapid ventricular  response.   HISTORY OF PRESENT ILLNESS:  This is a 60 year old African-American  patient who was found unresponsive at home by his niece and was brought  to the emergency room as a result in a private car.  The patient's niece  had seen him awake, alert and oriented around 1 o'clock and heard a  thump and found him to have fallen to the floor and became unresponsive.  The patient was brought to the emergency room and a code stroke was  called.  The patient was found to have some right-sided weakness and he  was aphasic.  He does have a past medical history of seizures, diabetes,  CHF with an EF of 10 to 20%, hypertension and chronic cocaine abuse with  a history of atrial fibrillation and medical noncompliance with Coumadin  therapy.  The patient also had a known history of apical thrombus.  The  patient was given TPA in the emergency room and brought to the ICU for  continued management.  The patient is currently on a heparin drip,  phenobarbital, insulin and digoxin IV.  Patient's family members are not  at bedside and we are unable to get any further history; most of his  history is obtained from current and old records concerning this acute  event and prior incidences.   The MRI of the brain revealed an acute stroke affecting a large portion  of the left middle cerebral artery territory and some left anterior  cerebral artery territory, there is chronic small-vessel change in the  white  matter of the right hemisphere, diminished flow to the right  internal carotid artery compared to the left.   The patient remains in atrial fibrillation, RVR with ventricular rate of  112 to 120 beats per minute.  We are asked to evaluate further and  assist in rate control.   PAST MEDICAL HISTORY:  1. Recent hospitalization and treatment with tuberculosis.  2. Uncertain etiology for cardiomyopathy with an EF of 10 to 20% per      echocardiogram July of 2007.  3. Hypertension.  4. Non-insulin-dependent diabetes mellitus.  5. Seizure disorder.  6. History of atrial fibrillation with history of intraatrial thrombus      in 1998.  7. Polysubstance abuse, smokes primarily cocaine.   SOCIAL:  The patient has a history of ETOH and substance abuse.  He  lives in [place], uncertain if his niece lives with him.   FAMILY HISTORY:  Mother died of diabetes and also had lupus.  Father  died of diabetes.   PAST SURGICAL HISTORY:  Unknown.  CURRENT MEDICATIONS:  1. Digoxin 0.125 mg IV daily.  2. Heparin 25,000 units every 36 hours.  3. NovoLog insulin per sliding scale.  4. Phenobarbital injection 60 mg b.i.d.   Prior medications he was supposed to be on prior to hospitalization:  1. Coreg 37.5 mg b.i.d..  2. Lasix 20 mg once a day.  3. Glucotrol 2.5 mg once a day.  4. Lisinopril 10 mg daily.  5. Phenobarbital 60 mg p.o. b.i.d.   ALLERGIES:  No known drug allergies are listed.   CURRENT LABS:  Sodium 132, potassium 4.5, chloride 101, CO2 16, BUN 15,  creatinine 1.17, glucose 128, hemoglobin 20.1, hematocrit 59.0, white  blood cells 10.2, platelets 221.  Troponin 0.32, CK 73, MB 3.14.  Total  cholesterol 156, triglycerides 78, HDL 47, LDL 93.  Alkaline phosphatase  84, total bili 1.8, AST 41, ALT 28, total protein 7.3, albumin 3.1.   Chest x-ray revealing bullae blebs at apices, mainly on the left,  cardiomyopathy with no CHF or acute lung process.   CT of the brain as described  above.   EKG revealing atrial fibrillation with a ventricular rate of 93 beats  per minute with T wave inversion anterior laterally leads V3-V6 with  nonspecific T wave changes noted inferiorly.   PHYSICAL EXAMINATION:  VITAL SIGNS:  Blood pressure 126/90, pulse 112,  respirations 22, temperature 97.9, O2 sat 98%.  HEENT:  Head is normocephalic, atraumatic.  His eyes are reactive to  light.  Pinpoint pupils are noted.  Mucous membranes are pink and moist.  Poor dentition.  NECK:  Supple.  There is a carotid bruit noted on the left.  No JVD is  noted.  No carotid bruit on the right.  CARDIOVASCULAR:  Irregular rate and rhythm with no murmurs, rubs or  gallops at present.  LUNGS:  Essentially clear to auscultation anteriorly and laterally,  unable to auscultate posteriorly.  ABDOMEN:  Soft, nontender with hyperactive bowel sounds.  The patient  does have an NG-tube in place.  EXTREMITIES:  Without clubbing, cyanosis or edema.  He does have  bilateral intermittent compression device hose.  NEURO:  The patient is aphasic and sedated, is only responsive to pain  and stimulation.   IMPRESSION:  1. Atrial fibrillation with rapid ventricular response.  2. Status post left-sided cerebrovascular accident status post TPA on      admission on August 11, 2006.  3. Cocaine abuse.  4. Hypertension by history.  5. Cardiomyopathy with an ejection fraction of 10 to 15%.  6. History of human immunodeficiency virus.  7. Recent treatment for tuberculosis per history.  8. Diabetes.  9. History of medical noncompliance.   PLAN:  1. This is a 60 year old African-American male unfortunately found to      be unresponsive with positive cocaine, and had a cerebrovascular      accident on the left status post TPA with a history of atrial      fibrillation, medical noncompliance with cardiomyopathy, ejection      fraction of 10 to 15% per most recent echo in June of 2007.  2. The patient was seen and  examined.  Will continue heparin      anticoagulation per stroke team.  Would start Cardizem 10 mg an      hour drip after 10 mg bolus.  Heart rate control is imperative at      this time.  Would not restart Coreg, it is contraindicated in the  setting of cocaine use.  Cardiomyopathy and medical noncompliance      issues predispose this patient to a very poor prognosis.  Will      reinstitute Lasix at a later date if evidence of congestive heart      failure occurs.   We will follow along with you making further recommendations.      Bettey Mare. Lyman Bishop, NP      Jesse Sans. Daleen Squibb, MD, [facility]  Electronically Signed    KML/MEDQ  D:  12/13/2006  T:  12/13/2006  Job:  [id number]   cc:   Dr. Philipp Deputy

## 2010-10-19 NOTE — Discharge Summary (Signed)
NAMEGORDIE, CRUMBY             ACCOUNT NO.:  [id number]   MEDICAL RECORD NO.:  [id number]          PATIENT TYPE:  INP   LOCATION:  4712                         FACILITY:  [facility]   PHYSICIAN:  Hind I Elsaid, MD      DATE OF BIRTH:  [birth year]   DATE OF ADMISSION:  11/22/2008  DATE OF DISCHARGE:  12/02/2008                               DISCHARGE SUMMARY   DISCHARGE DIAGNOSES:  1. Severe ischemia with dry gangrene of the left foot, status post      left below-knee amputation by Dr. Arbie Cookey on November 25, 2008.  2. Stump bullae/blister.  3. Leukocytosis, resolved.  4. Chronic atrial fibrillation.  Patient not on Coumadin as primary      care discontinued.  Aspirin started during hospital stay.  Patient      has high score.  5. Severe cardiomyopathy with an ejection fraction of 10% to 20%.  6. Type 2 diabetes mellitus with hemoglobin A1c of 8.4.  7. Previous history of a stroke with residual aphasia and right side      hemiparesis.  8. Hypertension.  9. History of polysubstance abuse including alcohol and crack abuse.  10.History of medical noncompliance.  11.History of seizure.  12.History of congestive heart failure.   DISCHARGE MEDICATIONS:  1. Aspirin 81 mg p.o. daily.  2. Cipro 500 mg p.o. every 12 hours for 10 days.  3. Digoxin 0.125 mg p.o. daily.  4. Docusate 100 mg p.o. daily p.r.n.  5. Lasix 20 mg p.o. daily.  6. Glucotrol 5 mg p.o. daily.  7. Insulin Lantus 15 units subcu q.h.s.  8. Insulin sensitive sliding scale t.i.d., a.c., and h.s.  9. Lisinopril 2.5 mg p.o. daily.  10.Lopressor 25 mg twice daily.  11.Potassium chloride 20 mEq p.o. daily.  12.Percocet 1 to 2 tabs p.o. every 6 hours p.r.n.  13.Plavix 75 mg p.o. daily.   HOSPITAL COURSE:  Please review the hospital course done by Dr. Elliot Cousin regarding his:  1. Left foot gangrene, status post left BKA with stump blister and      leukocytosis.  Patient started on Zosyn and vancomycin.  During      hospital  stay, leukocytosis completely resolved.  White blood cells      today are 10.3.  Had blood culture which was negative and      urinalysis and culture which were negative.  Patient switched to      Cipro 500 mg p.o. b.i.d. for 10 days.  Dr. Arbie Cookey being following      with [place] the blister and his recommendation is to continue with      Cipro during discharge and patient needs to have a followup with      Dr. Arbie Cookey. Appointment made to see Dr. Arbie Cookey on Friday, December 05, 2008, phone number [phone].  2. Severe cardiomyopathy which remained compensated.  No evidence of      acute decompensation and patient remained on his dose of digoxin      and Lasix and ACE inhibitor as mentioned before added during  hospital stay.  3. Type 2 diabetes mellitus, remained under good control  4. Chronic A-Fib.  Rate remained under good control and patient off      Coumadin by his primary care physician secondary to noncompliance      and difficulty controlling his INR.  Accordingly, aspirin and      Plavix were also started.  5. History of previous stroke, has expressive aphasia, right side      hemiparesis, and patient got a speech therapy evaluation during      hospital stay and he will got modified barium swallow today and the      recommendation by the nutritionist and speech therapy at this time      to get dysphagia #3 mechanical soft nectar and thick liquid, small      bites and sips, and full supervision.   At this time, we felt the patient was medically stable to be transferred  to SNF and patient needs to follow up with Dr. Arbie Cookey on December 05, 2008, at  2:45.  Also, patient needs to follow up with his primary care physician.      Hind Bosie Helper, MD  Electronically Signed     HIE/MEDQ  D:  12/01/2008  T:  12/01/2008  Job:  [id number]

## 2010-10-19 NOTE — Op Note (Signed)
[name], [name]             ACCOUNT NO.:  [id number]   MEDICAL RECORD NO.:  [id number]          PATIENT TYPE:  INP   LOCATION:  2034                         FACILITY:  [facility]   PHYSICIAN:  Larina Earthly, M.D.    DATE OF BIRTH:  September 01, 1950   DATE OF PROCEDURE:  01/27/2009  DATE OF DISCHARGE:                               OPERATIVE REPORT   PREOPERATIVE DIAGNOSIS:  Nonviable ischemic right foot.   POSTOPERATIVE DIAGNOSIS:  Nonviable ischemic right foot.   PROCEDURE:  Right above-knee amputation.   SURGEON:  Larina Earthly, MD   ASSISTANT:  Wilmon Arms, PA-C   ANESTHESIA:  General endotracheal.   COMPLICATIONS:  None.   DISPOSITION:  Recovery room, stable.   PROCEDURE IN DETAIL:  The patient was taken to the operating room and  placed in the supine position where the area of the right leg was  prepped and draped in the usual sterile fashion.  A fishmouth incision  was made below the level of the knee and carried down through the fascia  and muscle to the level of the femur with electrocautery.  Neurovascular  bundle was ligated with 0 Vicryl ties and divided.  The periosteum was  elevated off the femur.  The femur was divided with a Gigli saw.  The  edges of the femur were smoothed with a bone rasp.  The anterior fascia  was closed with posterior fascia after irrigating the wound.  This was  done with interrupted 0 Vicryl figure-of-eight sutures.  Skin was closed  with skin staples.  Sterile dressing and Ace wrap were applied.  The  patient tolerated the procedure without immediate complication and was  transferred to the recovery room in stable condition.      Larina Earthly, M.D.  Electronically Signed     TFE/MEDQ  D:  01/27/2009  T:  01/28/2009  Job:  [id number]

## 2010-10-19 NOTE — Op Note (Signed)
NAMEJUSTIS, CLOSSER             ACCOUNT NO.:  [id number]   MEDICAL RECORD NO.:  [id number]          PATIENT TYPE:  INP   LOCATION:  4712                         FACILITY:  [facility]   PHYSICIAN:  Larina Earthly, M.D.    DATE OF BIRTH:  1950-06-10   DATE OF PROCEDURE:  11/25/2008  DATE OF DISCHARGE:                               OPERATIVE REPORT   PREOPERATIVE DIAGNOSIS:  Severe ischemia with dry gangrene of the left  foot.   POSTOPERATIVE DIAGNOSIS:  Severe ischemia with dry gangrene of the left  foot.   PROCEDURE:  Left below-knee amputation.   SURGEON:  Larina Earthly, MD   ASSISTANT:  Jerold Coombe, PA-C   ANESTHESIA:  General endotracheal.   COMPLICATIONS:  None.   DISPOSITION:  Recovery room, stable.   INDICATIONS FOR THE PROCEDURE:  The patient is a 60 year old gentleman  presenting with severe ischemia and dry gangrene of his left foot.  This  included his great toe and first metatarsal head, and also gangrenous  changes of the second and third toe.  He had erythema up to the level of  his ankle.  He had palpable normal popliteal pulse and no distal pulses.  He has had a prior stroke with paralysis of his right side.  Discussed  options with Mr. Duque and multiple family members.  I explained that  with normal flow to the below-knee popliteal area and severe profound  ischemia with extensive gangrene, there was no hope for limb salvage  attempts.  I recommended a primary below-knee amputation.   PROCEDURE IN DETAIL:  The patient was taken to the operating room and  placed in supine position.  The area of the left leg was prepped and  draped in usual sterile fashion.  Using a posterior based muscle flap,  an incision was made anteriorly through the anterior tibial muscle  bodies with electrocautery.  The anterior tibial neurovascular bundle  was ligated with a 0 Vicryl tie and divided.  The gastrocnemius was left  in place to the posterior muscle and skin  pedicle.  The soleus was  divided in line with the anterior incision.  The periosteum was elevated  proximally off the fibula and tibia.  Posterior tibial and peroneal  arteries were ligated with 0 Vicryl ties and divided.   The tibia was divided with a Gigli saw and the fibula was divided  slightly proximal to this area with bone shear.  The anterior portion of  the tibia was beveled, and was smoothed with a bone rasp.  The wound was  irrigated, and hemostasis was obtained with electrocautery.  The  posterior flap was brought up anteriorly and was closed with  anterior flap with interrupted 0 Vicryl figure-of-eight sutures.  Skin  staples were applied.  An iodoform gauze and sterile dressing was  applied with Ace wrap over this.  The patient tolerated the procedure  without immediate complication, and was transferred to the recovery room  in stable condition.      Larina Earthly, M.D.  Electronically Signed     TFE/MEDQ  D:  11/25/2008  T:  11/26/2008  Job:  [id number]

## 2010-10-19 NOTE — Assessment & Plan Note (Signed)
OFFICE VISIT   JAVONNIE, ILLESCAS  DOB:  26-Oct-1950                                       01/23/2009  ZOXWR#:[id number]   The patient presents today for followup of his below knee amputation on  11/25/2008.  He had presented to [facility] at that time with  gangrene of his left foot.  He has healed his below knee amputation but  continues to have progressive tissue loss on his right foot.  He has had  a severe stroke in the past leaving him with aphasia and total paralysis  on his right side.  He has a chronic contracture of his right knee.  He  reports he is here with his family member and reports that he is having  severe continuous pain in his right foot.   PAST MEDICAL HISTORY:  Significant for chronic atrial fibrillation not  on Coumadin, is on aspirin therapy for this, has severe cardiomyopathy  with ejection fraction of 10-20%, type 2 diabetes mellitus, prior  history of stroke, history of hypertension, prior history of  polysubstance abuse with alcohol and crack cocaine abuse in the past,  history of seizures and history of congestive heart failure.   PHYSICAL EXAM:  A thin black male in no acute distress in a wheelchair.  His heart is irregular.  His chest is clear bilaterally.  He does have  palpable right radial pulse and open ulceration over his lateral  malleolus and lateral foot.   I had a discussion with the patient.  I am quite pleased with the  healing of his left below knee amputation.  Explained that indications  for right above knee amputation would be for tissue loss which is  progressive or pain.  The tissue loss is currently manageable but he  reports that he is having severe pain and wishes to proceed with above  knee amputation.  I explained to the patient and his family that we  would not recommend a below knee amputation due to his severe  contracture and the very difficult possibility of healing this.  He  understands  and we will proceed with above knee amputation on 08/24 at  [facility].   Larina Earthly, M.D.  Electronically Signed   TFE/MEDQ  D:  01/23/2009  T:  01/23/2009  Job:  [id number]

## 2010-10-19 NOTE — Assessment & Plan Note (Signed)
OFFICE VISIT   WAH, SABIC  DOB:  [birth year]                                       12/05/2008  VFIEP#:[id number]   The patient is in today for follow-up of his left below-knee amputation.  I had seen him in the hospital consultation where he had gangrene of his  left foot.  He did have a palpable left popliteal pulse.  I explained  that his only option would be primary amputation.  He underwent this on  November 25, 2008.  He had blistering over the tip of his stump on the  postop day #2 on initial removal of his dressing.  This was unusual  since he did have a normal popliteal pulse.  Fortunately has not had any  further progression.  He does have what appears to be partial thickness  blistering over the tip of the below knee amputation.  I have redressed  this and will see him again in 3 weeks at which time we will proceed  with removal of his staples.   Larina Earthly, M.D.  Electronically Signed   TFE/MEDQ  D:  12/05/2008  T:  12/09/2008  Job:  [id number]

## 2010-10-19 NOTE — Discharge Summary (Signed)
NAMEKAIO, KUHLMAN             ACCOUNT NO.:  [id number]   MEDICAL RECORD NO.:  [id number]          PATIENT TYPE:  INP   LOCATION:  2034                         FACILITY:  [facility]   PHYSICIAN:  Larina Earthly, M.D.    DATE OF BIRTH:  1950-08-06   DATE OF ADMISSION:  01/27/2009  DATE OF DISCHARGE:  02/01/2009                               DISCHARGE SUMMARY   FINAL DISCHARGE DIAGNOSES:  1. Nonviable ischemic right foot.  2. Peripheral vascular disease.  3. History of stroke with aphasia and total paralysis on the left side      with contracture of his right knee, chronic.  4. Chronic atrial fibrillation, not on Coumadin.  5. Severe cardiomyopathy.  6. Type 2 diabetes mellitus.  7. Prior history of stroke.  8. History of hypertension.  9. Polysubstance abuse.  10.History of seizures.  11.Congestive heart failure.  12.Status post left below-knee-amputation.   PROCEDURE PERFORMED:  Right AKA January 27, 2009.   COMPLICATIONS:  None.   CONDITION AT DISCHARGE:  Stable, improving.   DISCHARGE MEDICATIONS:  1. Celexa 20 mg p.o. daily.  2. Glucerna 1 can up to 3 times daily.  3. Fentanyl 25 mcg per hour patch.  4. Metoprolol 25 mg p.o. b.i.d.  5. Plavix 75 mg p.o. daily.  6. Ocuvite p.o. daily.  7. Lisinopril 2.5 mg p.o. daily.  8. Oxycodone 5/325 p.o. q.6 h. p.r.n. pain.  9. Digoxin 0.125 mg p.o. daily.  10.Lasix 20 mg p.o. daily.  11.Klor-Con 20 mEq p.o. daily.   He is given prescriptions for Lantus 10 units subcu nightly at 8 p.m.,  and Percocet 5/325 p.o. q.6 h. p.r.n. pain, total #20 were given.   DISPOSITION:  He is being discharged home in stable condition with his  wounds healing well.  He is given careful instructions regarding the  care of his wounds and his activity level.  He will be scheduled for an  appointment to see Dr. Arbie Cookey in 4 weeks, the office will arrange this  visit.  Staples will be removed at that time.  He will be instructed to  see his personal  family physician within the next 2 weeks for further  diabetic teaching and management.   BRIEF IDENTIFYING STATEMENT:  For complete details, please refer the  typed history and physical.  Briefly, this 60 year old gentleman who was  aphasic due to his stroke has a nonhealing ulcer on his right heel with  a chronically contracted right knee joint.  Dr. Arbie Cookey evaluated him and  recommended right AKA.  He and his family were informed of the risks and  benefits of the procedure and after careful consideration, elected to  proceed with surgery.   HOSPITAL COURSE:  Preoperative workup was completed as an outpatient.  He was brought in through same-day surgery and underwent the  aforementioned right AKA.  For complete details, please refer the typed  operative report.  The procedure was without complications.  He was  returned to the Post Anesthesia Care Unit, extubated.  Following  stabilization, he was transferred to a bed on the surgical convalescent  floor.  Over the next several days, he was carefully watched.  He did  become pain free.  His wounds were healing very well.  We did offer the  option of a skilled nursing facility; however, the family declined.  Following the hospital, it was noted that his sugars were quite  elevated.  We did start him on Lantus 10 units subcu at bedtime for  this.  We did recommend that he follow up with his personal physician  within the next 2 weeks for further teaching and management.  He is felt  to be stable and will be discharged home on February 01, 2009.      Wilmon Arms, PA      Larina Earthly, M.D.  Electronically Signed    KEL/MEDQ  D:  02/01/2009  T:  02/01/2009  Job:  [id number]

## 2010-10-22 ENCOUNTER — Emergency Department (HOSPITAL_COMMUNITY): Payer: PRIVATE HEALTH INSURANCE

## 2010-10-22 ENCOUNTER — Observation Stay (HOSPITAL_COMMUNITY)
Admission: EM | Admit: 2010-10-22 | Discharge: 2010-10-24 | Disposition: A | Discharge status: Home / Self Care | Payer: PRIVATE HEALTH INSURANCE | Attending: Emergency Medicine | Admitting: Emergency Medicine

## 2010-10-22 DIAGNOSIS — E876 Hypokalemia: Secondary | ICD-10-CM | POA: Insufficient documentation

## 2010-10-22 DIAGNOSIS — I509 Heart failure, unspecified: Secondary | ICD-10-CM | POA: Insufficient documentation

## 2010-10-22 DIAGNOSIS — R627 Adult failure to thrive: Secondary | ICD-10-CM | POA: Insufficient documentation

## 2010-10-22 DIAGNOSIS — F17200 Nicotine dependence, unspecified, uncomplicated: Secondary | ICD-10-CM | POA: Insufficient documentation

## 2010-10-22 DIAGNOSIS — R630 Anorexia: Secondary | ICD-10-CM | POA: Insufficient documentation

## 2010-10-22 DIAGNOSIS — A498 Other bacterial infections of unspecified site: Secondary | ICD-10-CM | POA: Insufficient documentation

## 2010-10-22 DIAGNOSIS — I4891 Unspecified atrial fibrillation: Secondary | ICD-10-CM | POA: Insufficient documentation

## 2010-10-22 DIAGNOSIS — Z8673 Personal history of transient ischemic attack (TIA), and cerebral infarction without residual deficits: Secondary | ICD-10-CM | POA: Insufficient documentation

## 2010-10-22 DIAGNOSIS — J449 Chronic obstructive pulmonary disease, unspecified: Secondary | ICD-10-CM | POA: Insufficient documentation

## 2010-10-22 DIAGNOSIS — E119 Type 2 diabetes mellitus without complications: Secondary | ICD-10-CM | POA: Insufficient documentation

## 2010-10-22 DIAGNOSIS — I5022 Chronic systolic (congestive) heart failure: Secondary | ICD-10-CM | POA: Insufficient documentation

## 2010-10-22 DIAGNOSIS — J4489 Other specified chronic obstructive pulmonary disease: Secondary | ICD-10-CM | POA: Insufficient documentation

## 2010-10-22 DIAGNOSIS — N390 Urinary tract infection, site not specified: Principal | ICD-10-CM | POA: Insufficient documentation

## 2010-10-22 LAB — CBC
MCH: 30.3 pg (ref 26.0–34.0)
MCV: 87.3 fL (ref 78.0–100.0)
Platelets: 219 10*3/uL (ref 150–400)
RDW: 13.5 % (ref 11.5–15.5)
WBC: 9.5 10*3/uL (ref 4.0–10.5)

## 2010-10-22 LAB — URINALYSIS, ROUTINE W REFLEX MICROSCOPIC
Glucose, UA: NEGATIVE mg/dL
Protein, ur: >300 mg/dL — AB
Specific Gravity, Urine: 1.021 (ref 1.005–1.030)
pH: 6 (ref 5.0–8.0)

## 2010-10-22 LAB — DIFFERENTIAL
Basophils Relative: 1 % (ref 0–1)
Eosinophils Absolute: 0.2 10*3/uL (ref 0.0–0.7)
Eosinophils Relative: 2 % (ref 0–5)
Lymphs Abs: 2.4 10*3/uL (ref 0.7–4.0)

## 2010-10-22 LAB — BASIC METABOLIC PANEL
BUN: 12 mg/dL (ref 6–23)
Creatinine, Ser: 0.62 mg/dL (ref 0.4–1.5)
GFR calc Af Amer: >60 mL/min (ref >60)
GFR calc non Af Amer: >60 mL/min (ref >60)
Potassium: 4 mEq/L (ref 3.5–5.1)

## 2010-10-22 LAB — URINE MICROSCOPIC-ADD ON

## 2010-10-22 NOTE — Consult Note (Signed)
[name], [name] NO.:  [id number]   MEDICAL RECORD NO.:  [id number]          PATIENT TYPE:  INP   LOCATION:  3301                         FACILITY:  [facility]   PHYSICIAN:  Pricilla Riffle, MD, FACCDATE OF BIRTH:  Jan 05, 1951   DATE OF CONSULTATION:  03/27/2006  DATE OF DISCHARGE:                                   CONSULTATION   IDENTIFICATION:  [name] is a 60 year old gentleman with a history of CHF  whom we were asked to see regarding shortness of breath and chest pressure.   HISTORY OF PRESENT ILLNESS:  The patient is a very poor historian.  He was  admitted back in July of 2007 with CHF; and found to have an EF of 10-20% by  echocardiogram, also with known atrial fibrillation, not felt to be a  Coumadin candidate.  Note, on last admission the patient was positive for  cocaine and also had EtOH abuse.  The patient says over the past week or so  he has been more short of breath.  He denies palpitation, no syncope, hurts,  but right now on talking to him difficult to assess location.  He claims  that he is taking his medicines and followed by Dr. Reche Dixon at [facility].   ALLERGIES:  None.   MEDICATIONS AT HOME:  1. Aspirin 81 mg day.  2. Protonix 40 daily.  3. Digoxin 0.125 daily.  4. Lasix 20 daily.  5. Lisinopril 5 daily.   HERE IN THE EMERGENCY ROOM:  1. Diltiazem IV with drip of 5 mL/h.  2. Lasix 40 IV b.i.d.  3. Heparin.  4. Plavix.  5. ASA 325.  6. Calcium gluconate x1.  7. NovoLog insulin.  8. Albuterol.  9. Ativan.   PAST MEDICAL HISTORY:  1. CHF, etiology not evaluated.  Echocardiogram in July EF of 10-15% with      mild MR.  Note, the ventricle is dilated at 64 mm.  The left atrium is      normal-to-mildly dilated.  RV is mildly dilated with moderate TR.  2. Atrial fibrillation, last admission, noted not to be a Coumadin      candidate because of noncompliance.  3. Hypertension.  4. Polysubstance abuse.  5. Medical noncompliance.  6. Type 2 diabetes.  7. HIV.   SOCIAL HISTORY:  The patient lives in [place] with niece.  He smokes 1/2  pack per day for the past 18 years, drinks, and has a history of cocaine  use.   FAMILY HISTORY:  Mother died of diabetes also had lupus.  Father died of  diabetes.   REVIEW OF SYSTEMS:  Some obtained by primary physician as the patient is not  answering much.  Notes some chills, sweats, chest pressure, and shortness of  breath.  He has no orthopnea, cough, increased urinary frequency.  Positive  diarrhea.  History of GE reflux.  Note, question diet, questions salt  intake.  Otherwise all systems reviewed and negative to the above problem.   PHYSICAL EXAM:  GENERAL:  The patient is lethargic; fades off on  questioning.  VITAL SIGNS:  Blood pressure is 114/79.  Pulse is 118 irregular.  Temperature is 97.4.  O2 saturation on room air is 94%.  HEENT:  Normocephalic, atraumatic.  PERL.  Sclerae slightly icteric.  NECK:  Increased JVP.  No bruits.  CARDIAC EXAM:  Irregular rate and rhythm.  S1-S2.  Positive S3.  PMI is  diffuse.  Grade 1/6 systolic murmur in the left sternal border.  LUNGS:  Mild wheezes right lower lobe occasional crackles.  ABDOMEN:  Positive for hepatomegaly with right upper quadrant tenderness.  EXTREMITIES:  2+ dorsalis pedis.  No edema.  Feet warm.   CHEST CT:  Shows an old PE, enlarged right hilar node, moderate right  pleural effusion, ill-defined densities, right greater than left, ground-  glass appearance at the base.  A 12-lead EKG shows atrial fibrillation with  a rate of 98 beats/minute and occasional PVCs.  Nonspecific ST changes.   LABS:  Significant for a hemoglobin of 16.4.  BUN and creatinine of 23 and  1.3, potassium of 4.6, magnesium 1.5.  BNP of 29 and 37.  Troponin  0.11/0.12.  CK 110, MB 4.7.  Digoxin of less than 0.2.   IMPRESSION:  1. The patient is a 60 year old with a history of cardiomyopathy, question      etiology, polysubstance  abuse, alcohol abuse, HIV, and question of      ischemia.  Admitted now with congestive heart failure exacerbation.  He      notes shortness of breath worsening over the past week on exam; some      increased volume although not severe; note CT with ill-defined      opacities.  2. The patient has known atrial fibrillation and not a Coumadin candidate;      on digoxin, reported at home though a question of taking meds, rate was      fast on arrival  3. Cardiac enzymes and LFTs probably reflect overall state of congestive      heart failure decompensational low flow state.  4. Agree with diuresis.  Continue Digoxin, discontinue diltiazem given the      negative inotrope.  Change to Coreg for now.  Have counseled on cocaine      cessation.  This does provide some alpha blockade.  Add ACE inhibitor      as BP tolerates.  Follow response with Lasix and increase dose as      needed.  Replete magnesium, follow electrolytes.  Aspirin 81 mg daily.      Discontinue Plavix, okay to use heparin for now.  5. Will, again, need to address Coumadin with this patient; not felt to be      eligible, because of noncompliance on last visit in July.  6. Patient counseled on cocaine cessation.  Note, drug screen negative for      cocaine this admit.           ______________________________  Pricilla Riffle, MD, [facility]     PVR/MEDQ  D:  03/27/2006  T:  03/28/2006  Job:  [id number]

## 2010-10-22 NOTE — H&P (Signed)
NAME:  [name], [name]                 ACCOUNT NO.:  `   MEDICAL RECORD NO.:  [id number]          PATIENT TYPE:  INP   LOCATION:  1825                         FACILITY:  [facility]   PHYSICIAN:  Drue Dun, M.D.       DATE OF BIRTH:  Apr 08, 1951   DATE OF ADMISSION:  03/26/2006  DATE OF DISCHARGE:                                HISTORY & PHYSICAL   CHIEF COMPLAINT:  Shortness of breath and weakness.   PRIMARY CARE PHYSICIAN:  Dr. Philipp Deputy at [facility].   HISTORY OF PRESENT ILLNESS:  This is a 60 year-old [place] American male  with a history of end stage CHF with an EF of 10-20% on last echo in July of  2007, who presents with a 3-4-day history of weakness and shortness of  breath, which are increased with minimal exertion.  The patient's symptoms  have been worsening over the last couple of days.  He reports positive lower  extremity swelling.  He does report a history of previous hospitalizations  for CHF exacerbations in the past.  The patient also reports that he ran out  of some of his medications, but cannot remember which ones.  The patient  does report paroxysmal nocturnal dyspnea and 2-3 pillow orthopnea.  The  patient has a chronic cough productive of whitish sputum, which he reports  has worsened over the last few days.  He denies any fever, but does report  chills and night sweats for 2 to 3 days.  The patient also reports  difficulty eating secondary to his shortness of breath.  The patient reports  episodic sharp tingling in his left chest that lasts 1-2 seconds at a time  starting last week.  This pain is associated with diaphoresis, but is  nonradiating.  It comes on while exerting and at rest.   PAST MEDICAL HISTORY:  1. Congestive heart failure with last echo in July of 2007, which revealed      an EF of 10-20% diffuse left ventricular hypokinesis and high filling      pressure.  It also showed left atrial, right ventricular, and right      atrial mild to moderate  dilation.  2. Hypertension.  3. Type 2 diabetes.  4. Atrial fibrillation with history of intra-atrial thrombus in 1998.  5. Polysubstance abuse.  6. Medication noncompliance.   CURRENT MEDICATIONS:  1. Coumadin - noncompliant.  2. Digoxin - noncompliant.  3. Lasix - questionable compliance.  4. The patient cannot remember the remainder of medications and reports      his niece will bring his medications in the morning for dosages.   ALLERGIES:  NO KNOWN DRUG ALLERGIES.   FAMILY HISTORY:  The patient's mother died with diabetes, lupus, and an  unknown type of cancer.  The patient's father died of diabetes.  The patient  denies having any siblings.   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  The patient smokes one-half of a pack  per day since he was  60 years old and previously drank 2-3, 40-ounce beers  daily up until the last 2 weeks.  The patient also uses crack cocaine  reportedly for his shoulder pain.   REVIEW OF SYSTEMS:  The patient reports subjective weight loss.  He reports  a runny nose, sore throat, and cough.  He does report nausea, but denies  vomiting.  He denies melena or bright red blood in stools.  He reports  heartburn, but denies dysuria or hematuria.  He does report frequency of  urination.  He denies numbness and tingling.   PHYSICAL EXAMINATION:  VITAL SIGNS:  Temperature 97.5, heart rate 118,  respirations 25-28, blood pressure 106-114/79-95, oxygen saturation 94-99%  on room air.  GENERAL:  In general, the patient is awake, alert, and mildly agitated, but  in no acute distress.  HEENT:  Head is normocephalic and atraumatic.  Pupils are equal, round,  reactive to light and accommodation.  Extraocular muscles are intact.  The  patient has mild scleral icterus and conjunctival injection.  Nares are  without discharge.  Throat is without erythema or exudate.  NECK:  Supple and nontender  with no cervical, occipital, or supraclavicular  lymphadenopathy.  CARDIOVASCULAR:  Heart is irregularly irregular consistent with atrial  fibrillation.  The patient has 1+ dorsalis pedis pulses bilaterally and no  lower extremity edema present.  Per ER doctor, this has now resolved.  The  patient has positive jugulovenous distention.  LUNGS:  Clear to auscultation bilaterally with mildly increased rapid  breathing and decreased breath sounds at bases bilaterally.  ABDOMEN:  Abdomen has normoactive bowel sounds.  It is soft and nontender.  It is mildly tender to palpation over the suprapubic region.  No rebound or  guarding.  EXTREMITIES:  Warm and well perfused with no clubbing, cyanosis, or edema.  NEURO:  Cranial nerves 2-12 are grossly intact.  The patient has 5/5  strength bilaterally with some decreased effort in the lower extremities  bilaterally.  The patient has 1+ DTRs symmetric bilaterally.  Normal gait  with a negative Romberg's.   LABORATORY DATA:  CBC shows a white count of 8.4, hemoglobin 16.4,  hematocrit 49.6, platelets 191, neutrophils are 63%, lymphocytes 24%, ANC is  5.3.  BMP reveals a sodium of 133, potassium 4.7, chloride 105, bicarb 19.3,  BUN of 23, creatinine 1.2, and glucose 197.  Digoxin level is less than 0.2.  BMP is elevated at 2937.  PT is elevated at 18.2 and INR elevated at 1.5.  PTT is normal at 33.  Urinalysis reveals a specific gravity of 1.013, 30  protein, and is otherwise negative.  Micro is negative for hylan casts.  Urine drug screen is positive for cocaine, but otherwise negative.  Initial  set of point-of-care enzymes revealed a troponin of less than 0.05 and CK-MB  of 4.0.  A chest x-ray reveals cardiomegaly with probable asymmetric edema.  EKG shows atrial fibrillation with PVCs, poor R-wave progression, and T-wave  inversion stable compared to previous EKG from July of 2007.  ASSESSMENT/PLAN:  This is a 60 year old African American male  with end stage  congestive heart failure, and medication noncompliance here with weakness,  shortness of breath, and atrial fibrillation with atypical chest pain and  recent cocaine use.  1. Shortness of breath and weakness:  Likely secondary to CHF exacerbation      with BMP greater than 2000 and medication noncompliance.  I have      resumed his Lasix dose at 20  mg daily __________  40 mg IV b.i.d. for      now.  We will hold lisinopril given current blood pressures and avoid      beta blocker use with cocaine positive urine drug screen.  We will      measure strict I&Os and daily weights.  Followup BMP in a.m.  We will      also continue nebulizer treatments, as the patient likely has      underlying COPD.  2. Atrial fibrillation, non-rate controlled.  Dig level indicative of      noncompliance.  INR is subtherapeutic.  We will cover with Aggrenox for      now and reinitiate Coumadin therapy.  The patient's niece is to bring      medications in the morning.  We will begin diltiazem IV x1 and initiate      diltiazem drip for rate control to maintain heart rate less than 90-      100.  The patient will be placed in a telemetry bed.  3. Chest pain, atypical in nature and possibly secondary to cocaine      __________ , but given the patient's risk factors, we will rule out for      an MI with cardiac enzymes x3 and repeat EKG in the morning.  We will      also check D-dimer given shortness of breath and tachycardia.  If      positive, will get spiral CT to rule out PE.  4. Hypertension.  We will hold hypertension meds for now, given blood      pressure and rate control __________ important issues currently.  5. Type 2 diabetes.  We will check hemoglobin A1c and put on sliding scale      insulin without meal coverage.  Medications currently unknown.  6. Alcohol and cocaine abuse:  Will give Ativan 1 mg b.i.d. with 0.5 mg      q.8 h. p.r.n. for agitation and to prevent withdrawal.  We will  start      Ativan protocol as needed.  We will also give multivitamins daily and      thiamin.  7. FENGI.  The patient with mild scleral icterus on exam.  We will check      CMP in a.m.  We will place the patient on heart healthy carbohydrate      modified diet.  8. Prophylaxis.  Protonix and Lovenox.   DISPOSITION:  Pending.  Rule out MI, PE, and empiric symptoms CHF  exacerbation.  We will obtain social worker consult in the morning for  substance abuse.           ______________________________  Drue Dun, M.D.     EE/MEDQ  D:  03/27/2006  T:  03/27/2006  Job:  [id number]

## 2010-10-22 NOTE — Discharge Summary (Signed)
[name], [name]             ACCOUNT NO.:  [id number]   MEDICAL RECORD NO.:  [id number]          PATIENT TYPE:  INP   LOCATION:  3705                         FACILITY:  [facility]   PHYSICIAN:  Alvester Morin, M.D.  DATE OF BIRTH:  May 08, 1951   DATE OF ADMISSION:  12/27/2005  DATE OF DISCHARGE:  12/30/2005                                 DISCHARGE SUMMARY   DISCHARGE DIAGNOSES:  1. Congestive heart failure exacerbation secondary to dilated      cardiomyopathy with an ejection fraction of 10-20% on 2D echocardiogram      during this hospitalization.  2. Atrial fibrillation.  3. Hypertension.  4. Polysubstance abuse.  The patient was positive for cocaine.  He also      admitted heavy alcohol abuse.  5. History of medication noncompliance.   DISCHARGE MEDICATIONS:  1. Aspirin 81 mg p.o. daily.  2. Protonix 40 mg p.o. daily.  3. Digoxin 0.125 mg p.o. daily.  4. Lasix 20 mg p.o. daily.  5. Lisinopril 5 mg p.o. daily.   The patient was instructed to followup with Dr. Drue Second who is his primary  care physician.  He was instructed to schedule his own appointment at phone  number [phone].  He will need followup on atrial fibrillation and  anticoagulation and he will need BMET checked as he was started on Lasix and  Lisinopril during this hospitalization.  Unfortunately, we tried to schedule  an appointment for him with [facility] and we spent approximately 45  minutes to an hour on the phone with different answering machines at [facility] and we could not do it prior to patient discharge.   CONSULTATIONS:  Dr. Patty Sermons with Cardiology.   PROCEDURE:  None.   IMAGES ON ADMISSION:  1. Chest x-ray on December 29, 2005 showed cardiac enlargement with mild      vascular congestion and interval change from right lower lobe      infiltrate which initially was considered pulmonary edema.  2. 2D echocardiogram on December 28, 2005 showed an ejection fraction of 10-      20% with diffuse  left ventricular hypokinesis, pressure and peak      pulmonary artery pressure mildly increased.   HISTORY OF PRESENT ILLNESS:  [name] is a 60 year old African American  gentleman with past medical history significant for CHF with an ejection  fraction of 15% on a 2D echocardiogram in 1998 and atrial fibrillation also  diagnosed in 1998.  At that time, the patient had 2D echocardiogram showing  intraatrial thrombus.  The patient was also known with history of medication  noncompliance.  He had been Coumadin as an outpatient and followed up on  that by Dr. Drue Second at [facility].  His INR at [facility] was reportedly  low.  His primary care physician increased the Coumadin dose up to 30 mg  daily. Because the patient's INR even at admission was less than  therapeutic, I questioned his medication compliance.  He presented to [facility] on the date of December 27, 2005 with a chief complaint of  intermittent sharp  substernal chest pain associated with shortness of  breath.  His chest pain has been going on for about a month prior to  admission, had been on and off with an intensity of as low as 3-4/10 and as  high as 8-9/10 on the day prior to admission.  The patient's pain improved  when he rested and it occurred at exertion.  He denied cocaine abuse to me  but he admitted  to cocaine use to the ER physician, last one being on  Sunday prior to admission.  The patient denied any cough, fever, chills,  orthopnea, or back pain.  He admitted to one episode of vomiting clearly on  the morning of admission.   PHYSICAL EXAMINATION:  Vital signs: Showed a temperature of 97, blood  pressure of 133/83, heart rate of 102, respiratory rate 18, O2 sat of 98% on  room air.  General: Very thin and cachectic appearing male in no acute distress.  HEENT: Eyes PERRLA.  Extraocular movements intact.  ENT clear pharyngeal  mucosa.  Very poor dentition.  Neck: No adenopathy.  Respiratory:  Decreased breath sounds globally.  No wheezing, no rhonchi.  Cardiovascular: Irregularly irregular with no murmur.  GI: Soft, nondistended, nontender.  Positive bowel sounds.  Extremities: No edema.  Skin: No clubbing or cyanosis.  No lymphadenopathy.  Neurologic: II-XII grossly intact.  Sensory intact, 5/5 strength throughout  and normal DTRs.  Psychologically appropriate.   LABORATORY DATA:  Showed a sodium of 138, potassium of 4, chloride 106,  bicarb 25, BUN 13, creatinine 1.1 and glucose 169.  Positive for cocaine.  Digoxin level was less than 0.2.  PT 14.1 and INR 1.1.  The patient was  supposed to be on Coumadin 25 mg daily.  Cardiac markers showed troponin  less than 0.05 x2.  Myoglobin less than 500 and CK-MB of 59.   HOSPITAL COURSE:  1. 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  Cardiac enzymes have been slightly abnormal with      troponin as high as 0.09 and 0.07 on the day of discharge.  EKG showed      atrial fibrillation and nonspecific ST-T wave abnormality.  We      consulted cardiology Dr. Patty Sermons for further assessment and      treatment.  He considered the patient's chest pain secondary to cocaine      induced vasospasm and dilated cardiomyopathy and he did not think that      the patient needed during this hospitalization any further      intervention, like cardiac catheterization.  At discharge, the      patient's chest pain resolved.  He was significantly better.  He was      advised to stop using cocaine and take his medication as instructed.  2. Congestive heart failure exacerbation.  Most likely secondary to      cardiomyopathy, nonischemic alcohol and cocaine related versus ischemic      secondary to coronary artery disease.  The patient will need to be      reevaluated as an outpatient when his social problems are resolved and      he is sober for the possibility of an AICD.  He was treated during this      hospitalization with Lasix IV for his shortness of breath and pulmonary      edema.  He was started on Lisinopril as well.  He is supposed to  followup with Dr. Drue Second as an outpatient.  He will need to be      reevaluated for eligibility for beta blockers.  The patient had      borderline blood pressure during hospitalization that did not allow [place]      to add a third agent to the medication list prior to discharge even      though the patient would benefit from either Toprol or Lopressor.  3. Atrial fibrillation.  As previously stated, the patient was on Coumadin      as outpatient with repetitious subtherapeutic INRs.  During this      hospitalization he was considered ineligible for anticoagulation as an      outpatient because of his noncompliance and also his polysubstance      abuse.  He will need to followup with Dr. Drue Second as his primary care      physician to readdress the anticoagulation issue as an outpatient.  4. Polysubstance abuse.  The patient received counseling in the hospital.      He was explained the risk of his behavior.  He seemed to understand and      he seemed to be willing to try to followup with AA as an outpatient.      We offered him the number.  He also received smoking counseling.  At      discharge, the patient was stable.   Vital signs showed a temperature of 97.5, blood pressure of 119/82, heart  rate of 76, respiratory rate 20, O2 sat 96% on room air.  His BMET showed a  sodium of 137, a potassium of 3.2 which was repleated prior to discharge,  chloride of 106, bicarb of 24, BUN of 7 and creatinine of 1.1.  A hemoglobin  of 15, white count of 5.8 and platelets 147.      Vanetta Mulders, MD  Electronically Signed      Alvester Morin, M.D.  Electronically Signed    DA/MEDQ  D:  01/06/2006  T:  01/07/2006  Job:  [id number]

## 2010-10-22 NOTE — H&P (Signed)
NAME:  [name], [name] NO.:  [id number]  MEDICAL RECORD NO.:  [id number]           PATIENT TYPE:  E  LOCATION:  [facility]                         FACILITY:  [facility]  PHYSICIAN:  Brendia Sacks, MD    DATE OF BIRTH:  Jan 28, 1951  DATE OF ADMISSION:  10/22/2010 DATE OF DISCHARGE:                             HISTORY & PHYSICAL   CHIEF COMPLAINT:  Not eating well.  HISTORY OF PRESENT ILLNESS:  This is a 60 year old male, who presents to the emergency room with a history of failure to thrive and urinary frequency.  He has a history of stroke and is aphasic, so history is provided by his power of attorney.  History obtained by phone.  She reports that for the last 2 weeks, the patient has not been eating well or drinking very well.  She took him to the emergency room on October 04, 2010, where he was diagnosed with urinary tract infection and placed on Keflex.  His urine culture did reveal greater than 1000 colonies of Escherichia coli, which was sensitive to cefazolin.  The patient did take the antibiotics.  Nevertheless, he failed to improve.  He continued to have poor oral intake, urinary frequency, and a strong smelling urine.  No fever, but noted to hot sweats and cold sweats at home.  A couple of weeks ago, his blood sugars were running quite high, but blood sugar today was 120.  His primary care physician has taken them off insulin at this point and just has him on oral medication.  His mental status apparently is at baseline.  He has had no changes in his mental or neurologic status per his POA.  REVIEW OF SYSTEMS:  Negative for fever, changes to his vision, sore throat, rash, muscle aches, chest pain, shortness of breath, nausea, vomiting, abdominal pain, diarrhea, dysuria, or bleeding.  PAST MEDICAL HISTORY: 1. Bullous emphysema. 2. Left ventricular ejection fraction of 35% to 40% with diffuse left     ventricular hypokinesis, seen on echocardiogram in  2010. 3. 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 4. Atrial fibrillation, not a Coumadin candidate secondary to     noncompliance. 5. Hypertension. 6. Diabetes mellitus type 2. 7. Hyperlipidemia. 8. Peripheral vascular disease. 9. MI. 10.The ER record notes HIV; however, his power of attorney tells me     that this is not true. I see no confirmatory testing in the medical record here.  PAST SURGICAL HISTORY: 1. 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 2. 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  ALLERGIES:  None.  FAMILY HISTORY:  Negative for first-degree coronary artery disease.  SOCIAL HISTORY:  Smokes 5-10 cigarettes per day; occasionally, he is given beer by his daughter.  MEDICATIONS:  Awaiting medical reconciliation.  Medications are listed in the ER record, without dosages.  Note, the patient is not on antibiotics anymore. 1. Lisinopril. 2. Metoprolol. 3. Glipizide are listed. 4. The patient is currently not taking any insulin; although, he had     been on insulin in the past.  PHYSICAL EXAMINATION:  VITAL SIGNS:  Temperature  98.3, pulse 18, respirations 75, blood pressure 163/84, and sat 97% on room air. GENERAL:  Well-developed  and well-nourished man, appearing in no acute distress. HEAD:  Appears to be normal. EYES:  Sclerae clear.  Pupils are equal, round, and reactive.  Lids, irises, and conjunctivae appear unremarkable.  Hearing appears to be grossly normal.  Lips and tongue appear unremarkable. NECK:  Supple.  No lymphadenopathy or masses.  No thyromegaly. CHEST:  Clear to auscultation bilaterally.  No wheezes, rales, or rhonchi.  There is normal respiratory effort. CARDIOVASCULAR:  Irregular rate and rhythm.  No murmur, rub, or gallop. No left lower extremity edema.  He has a right AKA. SKIN:  Appears to be normal without rash or induration.  Nontender to palpation. ABDOMEN:   Soft, nontender, and nondistended.  No masses are appreciated. PSYCHIATRIC:  Quite pleasant.  He is unable to voice responses, but does follow commands, squeezes with his left hand to command. Spontaneously moves his left leg.  He was eating dinner prior to examination.  ANCILLARY STUDIES:  Chest x-ray is stable showing cardiomegaly and bullous emphysema.  PERTINENT LABORATORY STUDIES: 1. CBC entirely within normal limits. 2. Basic metabolic panel notable for glucose of 119 to 129.  BUN and     creatinine within normal limits. 3. Urinalysis, large leukocyte esterase, too numerous to count white     blood cells, protein greater than 300, and moderate blood.  ASSESSMENT AND PLAN:  A 61 year old male, who presents to the emergency room with failure to thrive and a urinary tract infection. 1. Urinary tract infection.  We will admit the patient to observation     as he has been eating quite poorly.  We will place him on     Rocephin. Urine culture 4/30: Escherichia coli, sensitive to     ceftriaxone.  We will place him on IV fluids.  He is nontoxic at     this point. 2. Failure to thrive and anorexia.  Hopefully, this will correct with     fluid administration and IV antibiotics. 3. Diabetes mellitus type 2, this appears to be stable.  We will place     him on sliding scale insulin at this point.  Once we have medical     reconciliation, we can place him back on his oral medication.  He     is not currently taking insulin at home, as his blood sugars have     been well controlled. 4. History of cerebrovascular accident, this appears to be stable. 5. History of systolic congestive heart failure, this appears to be     stable.  Await medical reconciliation, but given his high blood     pressure here, we will place him on low-dose beta blocker and ACE     inhibitor. 6. History of atrial fibrillation, not a Coumadin candidate secondary     to noncompliance as listed in the record. 7.  Code status.  The patient is DNR per his power of attorney, Lynden Ang,     phone number [phone].  I have discussed admission today with her and discussed the treatment plan and she was in agreement.     Brendia Sacks, MD     DG/MEDQ  D:  10/22/2010  T:  10/22/2010  Job:  [id number]  Electronically Signed by Brendia Sacks  on 10/22/2010 10:01:46 PM

## 2010-10-22 NOTE — Discharge Summary (Signed)
[name], [name]             ACCOUNT NO.:  [id number]   MEDICAL RECORD NO.:  [id number]          PATIENT TYPE:  INP   LOCATION:  3702                         FACILITY:  [facility]   PHYSICIAN:  Ruthe Mannan, M.D.       DATE OF BIRTH:  [birth year]   DATE OF ADMISSION:  03/26/2006  DATE OF DISCHARGE:  04/05/2006                                 DISCHARGE SUMMARY   REASON FOR ADMISSION:  Evaluation and treatment of shortness of breath and  weakness.   PRIMARY CARE PHYSICIAN:  Dr. Philipp Deputy at [facility], pulmonary,  and cardiology.   DISCHARGE DIAGNOSES:  1. Congestive heart failure exacerbation.  2. Infectious pulmonary process of unknown etiology but was not active      tuberculosis.  3. Diabetes.  4. Atrial fibrillation.  5. Polysubstance abuse.  6. Hypertension.  7. Medication noncompliance.   DISCHARGE MEDICATIONS:  1. Glucotrol 2.5 mg 1 tab every day a.c.  2. Digoxin 0.125 mg 1 tab daily.  3. Coreg 6.25 1 tab b.i.d.  4. Lasix 20 mg 1 tab daily.  5. Lisinopril 10 mg daily.   DISPOSITION:  Patient is to be discharged home with close followup with  [facility].  Patient was also counseled on the importance of substance  abuse counseling.  We alerted him of all the possible resources that there  are for him to stop using cocaine and other drugs.   DISCHARGE LABS:  Sodium of 133, potassium of 5.2, creatinine of 1.1, BUN of  27, glucose of 169, AFB is negative x3, hemoglobin A1c of 8.3.  Pleural  fluid showed transitive process of LDH fluid over LDH serum of 0.3  consistent with congestive heart failure.  CT on admission showed bilateral  pleural effusions with nodules in the right upper lobe of unknown etiology.  And D-dimer was 0.95, cardiac enzymes negative x3.   HOSPITAL COURSE:  1. Shortness of breath.  It was assumed on admission this is probably      likely to his CHF exacerbation as his BNP was greater than 2000 and he      was known to have medical non  compliance.  On admission, we resumed his      Lasix dose of 20 mg daily but we had to increase that to 40 mg b.i.d.      We also, at that time, held his Lisinopril because his current blood      pressures on admission were low and we did not want to use a beta      blocker either on admission, as he was cocaine positive with his urine      drug screen.  We immediately measured strict eyes and nose, and daily      weights and we followed up his BNP on a regular basis.  We also      continued his nebulizer treatment at that time as he had some      underlying COPD.  CT in the emergency room showed bilateral pleural      effusions along with densities in the right upper lobe which were  suspicious for tuberculosis.  He also had some review of systems      positive including night sweats and fevers, and weight loss.  We      therefore put him on contact precautions after admission.  We also      ordered to write 3, get 3 acid-fast cultures to rule out tuberculosis.      We eventually did receive 3 negative acid-fast cultures.  We also      tapped the pleural fluid which showed a transitive process which is      very consistent with congestive heart failure.  We also consulted      pulmonary at that time because he had these nodules in the right upper      lobe which seemed suspicious for either malignancy or an infectious      process.  At that time, we thought about getting a CT guided biopsy of      the right upper lobe to rule out malignancy, however, a repeat CT on      October 30th showed that the nodules had improved greatly so CT biopsy      was not needed at that time.  Patient also remained afebrile and his      white count had decreased throughout the course of his admission.  His      pleural effusions also did not come back.  We therefore were under the      assumption that most of this was likely due to CHF exacerbation with      also maybe an underlying infectious process of  unknown etiology which      was not active tuberculosis.  His PPD was questionably positive so it      is likely that he may need treatment in long term with [facility] for 3-6      months but due to his medical non compliance, and the fact that he is      not a danger to anyone else, we decided to not start that but it may be      something to do as an outpatient.  For his congestive heart failure, we      just diuresed him heavily throughout his admission.  We also pre      started him on Coreg and Lasix and lisinopril.  We had cardiology's      input.  We titrated up his Coreg, so at discharge he will be sent home      with Coreg 6.25 mg b.i.d., lisinopril 10 mg daily, and we also sent him      home on his home dose of Lasix of 20 mg daily.  At discharge, his blood      pressure, heart rate were all stable.  2. Atrial fibrillation.  He was regularly irregular throughout his      admission.  He had a history of an intra-atrial thrombus in 1998.  He      was, however, stable throughout his admission.  He came in not rate      controlled but he eventually became rate controlled with Digoxin.  We      restarted his Coumadin and his Aggrenox which we eventually stopped as      he is not compliant at home, so we did not feel comfortable sending him      home with Aggrenox.  Cardiology agreed with that assessment.  We will      send him  home with Digoxin and Coreg as well.  3. Patient did complain of chest pain.  He was ruled out x3 with cardiac      enzymes and his EKG was normal.  His D-dimer was also normal.  And his      CT did not show a pulmonary embolism.  It is likely that it was due to      his cocaine abuse.  4. Diabetes.  He came in as uncontrolled.  His hemoglobin A1c was 8.3.  We      started him on a sliding scale and continued his home dosage.  He is      not taking the Glucotrol 2.5 mg 1 tab every day a.c.  He became     controlled throughout his admission.  Hopefully, he will comply  to his      medical regimen at home and followup closely.  5. Hypertension.  He actually came in with his blood pressure was low,      with systolic of 106-114 over diastolic 79-95.  At that time, we held      his blood pressure medications.  We reinitiated it as he was tolerating      all of his medications well for congestive heart failure and AFib.      With maintaining his blood pressure over 90 systolically.  6. Poly substance abuse and medical noncompliance.  Patient is likely not      compliant with his medications due to poly substance abuse.  We talked      to him in depth about the importance of taking his medications and how      serious his congestive heart failure is, as his ejection fraction on      his last Echo last year was 10% to 15%.  Patient stated that he      understood and he wanted some help.  We will set him up with adult drug      services as an outpatient.   DISPOSITION:  Patient is stable and improving.  He will need to followup  closely and remain compliant with his medications.  We made sure that we  were sending him home on all medications that were covered by [facility].           ______________________________  Ruthe Mannan, M.D.     TA/MEDQ  D:  04/05/2006  T:  04/05/2006  Job:  [id number]

## 2010-10-23 LAB — BASIC METABOLIC PANEL
BUN: 9 mg/dL (ref 6–23)
Calcium: 9 mg/dL (ref 8.4–10.5)
Creatinine, Ser: 0.56 mg/dL (ref 0.4–1.5)
GFR calc non Af Amer: >60 mL/min (ref >60)

## 2010-10-23 LAB — CBC
MCH: 29.6 pg (ref 26.0–34.0)
MCV: 86.1 fL (ref 78.0–100.0)
Platelets: 212 10*3/uL (ref 150–400)
RDW: 13.4 % (ref 11.5–15.5)

## 2010-10-23 LAB — GLUCOSE, CAPILLARY
Glucose-Capillary: 121 mg/dL — ABNORMAL HIGH (ref 70–99)
Glucose-Capillary: 135 mg/dL — ABNORMAL HIGH (ref 70–99)

## 2010-10-24 LAB — CBC
HCT: 43.5 % (ref 39.0–52.0)
MCV: 86 fL (ref 78.0–100.0)
Platelets: 199 10*3/uL (ref 150–400)
RBC: 5.06 MIL/uL (ref 4.22–5.81)
WBC: 9.8 10*3/uL (ref 4.0–10.5)

## 2010-10-24 LAB — BASIC METABOLIC PANEL
BUN: 8 mg/dL (ref 6–23)
Chloride: 106 mEq/L (ref 96–112)
Glucose, Bld: 139 mg/dL — ABNORMAL HIGH (ref 70–99)
Potassium: 3.5 mEq/L (ref 3.5–5.1)

## 2010-10-24 LAB — GLUCOSE, CAPILLARY: Glucose-Capillary: 156 mg/dL — ABNORMAL HIGH (ref 70–99)

## 2010-10-25 LAB — URINE CULTURE
Colony Count: >=100000
Culture  Setup Time: 201205181657

## 2010-10-28 NOTE — Discharge Summary (Signed)
[name], [name]             ACCOUNT NO.:  [id number]  MEDICAL RECORD NO.:  [id number]           PATIENT TYPE:  O  LOCATION:  5528                         FACILITY:  [facility]  PHYSICIAN:  Hillery Aldo, M.D.   DATE OF BIRTH:  February 19, 1951  DATE OF ADMISSION:  10/22/2010 DATE OF DISCHARGE:  10/24/2010                              DISCHARGE SUMMARY   PRIMARY CARE PHYSICIAN:  Dr. Andrey Campanile at [facility].  DISCHARGE DIAGNOSES: 1. Escherichia coli urinary tract infection. 2. Failure to thrive. 3. Anorexia. 4. Type 2 diabetes. 5. History of stroke. 6. Hypokalemia. 7. Chronic systolic congestive heart failure. 8. Atrial fibrillation. 9. Tobacco abuse. 10.Chronic obstructive pulmonary disease.  DISCHARGE MEDICATIONS: 1. Cipro 500 mg p.o. b.i.d. x4 days. 2. Glipizide 10 mg p.o. b.i.d. 3. Hydrocodone 5/500 one tablet p.o. b.i.d. p.r.n. pain. 4. Lisinopril 20 mg p.o. daily. 5. Metoprolol tartrate 25 mg p.o. b.i.d. 6. Tramadol 50 mg p.o. t.i.d. p.r.n. pain.  CONSULTATIONS:  None.  BRIEF ADMISSION HISTORY OF PRESENT ILLNESS:  The patient is a 60 year old male who presented to the emergency department with failure to thrive and urinary frequency.  The patient has a past medical history of stroke and was unable to provide additional history.  The patient's power of attorney, his sister, notes that he recently had been treated with Keflex for urinary tract infection and that his urine culture grew Escherichia coli, which was sensitive to cefazolin.  The patient took all of the antibiotics as prescribed, but failed to improve and therefore, his sister brought him to the hospital for further evaluation.  For the full details, please see the dictated report done by Dr. Irene Limbo.  PROCEDURES AND DIAGNOSTIC STUDIES:  Chest x-ray on Oct 22, 2010, showed stable chest x-rays, cardiomegaly and bullous emphysematous changes at both apices.  DISCHARGE LABORATORY VALUES:  Urine cultures  grew greater than 100,000 colonies of Escherichia coli.  Sensitivities are pending.  Sodium is 141, potassium 3.5, chloride 106, bicarb 23, BUN 8, creatinine 0.65, glucose 139, calcium 9.4.  White blood cell count was 9.8, hemoglobin 14.8, hematocrit 43.5, platelets 199.  HOSPITAL COURSE BY PROBLEM: 1. Escherichia coli urinary tract infection:  The patient has     completed 3 days of therapy with Rocephin.  Will be transitioned     over to Cipro since he failed outpatient therapy with cephalexin. 2. Failure to thrive/anorexia:  The patient was seen in consultation     with the dietitian.  His appetite improved with treatment of his     urinary tract infection.  I did speak with his sister who reports     the patient does drink Glucerna supplements which he can continue     to do postdischarge. 3. Type 2 diabetes:  The patient is maintained on glipizide as an     outpatient.  Hemoglobin A1c shows a value of 8.3% indicating a mean     plasma glucose of 192.  Would recommend appropriate follow up with     his primary care physician for adjustment of medications to achieve     better outpatient glycemic control. 4. History of stroke:  The patient was stable and his basic at     baseline. 5. Hypokalemia:  The patient's potassium was repleted. 6. Chronic systolic congestive heart failure:  The patient is well     compensated on an ACE inhibitor and beta-blocker throughout his     hospital stay. 7. Paroxysmal atrial fibrillation:  The patient is not felt to be a     Coumadin candidate secondary with history of medical noncompliance.     His heart rate was in the normal range throughout his hospital     stay. 8. Questionable tobacco abuse:  The patient was placed on a nicotine     patch on admission. 9. Chronic obstructive pulmonary disease:  The patient's respiratory     status was stable throughout his hospital stay.  DISPOSITION:  The patient is medically stable and will be  discharged back home to the care of his power of attorney, his sister.  She indicates that she is able to take him home.  Time spent coordinating care for discharge and discharge fractions equals 35 minutes.  DISCHARGE INSTRUCTIONS:  Follow up with your primary care physician in 2- 3 weeks.  DISCHARGE DIET:  Diabetic.     Hillery Aldo, M.D.     CR/MEDQ  D:  10/24/2010  T:  10/24/2010  Job:  [id number]  cc:   Dr. Andrey Campanile  Electronically Signed by Hillery Aldo M.D. on 10/28/2010 12:00:19 PM

## 2011-03-21 LAB — POCT I-STAT CREATININE
Creatinine, Ser: 1
Operator id: 151321

## 2011-03-21 LAB — PROTIME-INR
INR: 1.1
Prothrombin Time: 14.2
Prothrombin Time: 14.4

## 2011-03-21 LAB — BASIC METABOLIC PANEL
BUN: 21
CO2: 22
Calcium: 8.6
Chloride: 101
Creatinine, Ser: 0.92
GFR calc Af Amer: >60
GFR calc non Af Amer: >60
GFR calc non Af Amer: >60
Glucose, Bld: 144 — ABNORMAL HIGH
Potassium: 4.1
Sodium: 128 — ABNORMAL LOW

## 2011-03-21 LAB — HEPARIN LEVEL (UNFRACTIONATED)
Heparin Unfractionated: 0.22 — ABNORMAL LOW
Heparin Unfractionated: 0.3
Heparin Unfractionated: 0.52
Heparin Unfractionated: <0.1 — ABNORMAL LOW

## 2011-03-21 LAB — I-STAT 8, (EC8 V) (CONVERTED LAB)
BUN: 30 — ABNORMAL HIGH
Bicarbonate: 25.5 — ABNORMAL HIGH
HCT: 57 — ABNORMAL HIGH
Hemoglobin: 19.4 — ABNORMAL HIGH
Operator id: 151321
Sodium: 135
TCO2: 27
pCO2, Ven: 53.3 — ABNORMAL HIGH

## 2011-03-21 LAB — CBC
HCT: 37.1 — ABNORMAL LOW
HCT: 44.1
HCT: 49.2
Hemoglobin: 14.9
MCHC: 33.5
MCHC: 33.8
MCHC: 33.9
MCHC: 34.1
MCV: 89.2
Platelets: 205
Platelets: 217
Platelets: 221
Platelets: 235
RBC: 4.16 — ABNORMAL LOW
RBC: 5.01
RBC: 5.24
RBC: 5.45
RDW: 13.4
RDW: 13.5
RDW: 13.5
WBC: 10.5
WBC: 11.2 — ABNORMAL HIGH

## 2011-03-22 LAB — COMPREHENSIVE METABOLIC PANEL
ALT: 24
ALT: 28
AST: 35
AST: 41 — ABNORMAL HIGH
Albumin: 3.1 — ABNORMAL LOW
Albumin: 3.3 — ABNORMAL LOW
Alkaline Phosphatase: 82
Alkaline Phosphatase: 84
BUN: 15
CO2: 16 — ABNORMAL LOW
Calcium: 9.2
Chloride: 101
Chloride: 102
Creatinine, Ser: 1.17
GFR calc Af Amer: >60
GFR calc non Af Amer: >60
Glucose, Bld: 128 — ABNORMAL HIGH
Potassium: 4.4
Potassium: 4.5
Sodium: 132 — ABNORMAL LOW
Sodium: 136
Total Bilirubin: 1.8 — ABNORMAL HIGH
Total Bilirubin: 1.8 — ABNORMAL HIGH
Total Protein: 7.3
Total Protein: 7.3

## 2011-03-22 LAB — I-STAT 8, (EC8 V) (CONVERTED LAB)
BUN: 15
Bicarbonate: 22.7
HCT: 59 — ABNORMAL HIGH
Hemoglobin: 20.1 — ABNORMAL HIGH
Operator id: 288331
Potassium: 4.4
Sodium: 135
TCO2: 24

## 2011-03-22 LAB — BASIC METABOLIC PANEL
BUN: 18
BUN: 21
BUN: 22
CO2: 22
CO2: 23
CO2: 24
Calcium: 8.5
Calcium: 9.2
Chloride: 103
Chloride: 104
Chloride: 108
Creatinine, Ser: 1
Creatinine, Ser: 1.18
Creatinine, Ser: 1.22
GFR calc Af Amer: >60
GFR calc Af Amer: >60
GFR calc non Af Amer: >60
GFR calc non Af Amer: >60
Glucose, Bld: 114 — ABNORMAL HIGH
Glucose, Bld: 150 — ABNORMAL HIGH
Potassium: 4.3
Potassium: 5
Potassium: 5.1
Sodium: 135
Sodium: 137

## 2011-03-22 LAB — CBC
HCT: 44.7
HCT: 46.7
HCT: 47.3
HCT: 52.9 — ABNORMAL HIGH
Hemoglobin: 15.9
Hemoglobin: 18 — ABNORMAL HIGH
MCHC: 33.4
MCHC: 33.7
MCHC: 34.1
MCV: 92.7
MCV: 93.8
MCV: 94.1
MCV: 95.6
Platelets: 131 — ABNORMAL LOW
Platelets: 146 — ABNORMAL LOW
Platelets: 153
Platelets: 177
RBC: 5.02
RBC: 5.04
RBC: 5.69
RDW: 13.8
RDW: 14
RDW: 14.2 — ABNORMAL HIGH
WBC: 12.1 — ABNORMAL HIGH
WBC: 14.9 — ABNORMAL HIGH

## 2011-03-22 LAB — DIFFERENTIAL
Basophils Absolute: 0.1
Basophils Relative: 0
Basophils Relative: 1
Eosinophils Absolute: 0
Eosinophils Absolute: 0.1
Eosinophils Relative: 0
Eosinophils Relative: 1
Lymphs Abs: 1.9
Monocytes Absolute: 1.2 — ABNORMAL HIGH
Monocytes Relative: 10
Monocytes Relative: 12 — ABNORMAL HIGH
Neutrophils Relative %: 77

## 2011-03-22 LAB — URINALYSIS, ROUTINE W REFLEX MICROSCOPIC
Bilirubin Urine: NEGATIVE
Glucose, UA: NEGATIVE
Ketones, ur: NEGATIVE
Ketones, ur: NEGATIVE
Leukocytes, UA: NEGATIVE
Nitrite: NEGATIVE
Nitrite: NEGATIVE
Protein, ur: 100 — AB
Protein, ur: NEGATIVE
Specific Gravity, Urine: 1.016
Urobilinogen, UA: 1
Urobilinogen, UA: 4 — ABNORMAL HIGH
pH: 5.5
pH: 5.5

## 2011-03-22 LAB — BLOOD GAS, ARTERIAL
Acid-base deficit: 1.4
Bicarbonate: 21.4
pCO2 arterial: 27.6 — ABNORMAL LOW
pO2, Arterial: 56.9 — ABNORMAL LOW

## 2011-03-22 LAB — POCT CARDIAC MARKERS
CKMB, poc: 1.3
Myoglobin, poc: 43.1
Operator id: 288331
Troponin i, poc: 0.09 — ABNORMAL HIGH

## 2011-03-22 LAB — PROTIME-INR
INR: 1
INR: 1.2
Prothrombin Time: 13.5
Prothrombin Time: 16 — ABNORMAL HIGH

## 2011-03-22 LAB — RAPID URINE DRUG SCREEN, HOSP PERFORMED
Amphetamines: NOT DETECTED
Barbiturates: NOT DETECTED
Benzodiazepines: NOT DETECTED
Cocaine: POSITIVE — AB
Opiates: NOT DETECTED
Tetrahydrocannabinol: NOT DETECTED

## 2011-03-22 LAB — ETHANOL: Alcohol, Ethyl (B): 20 — ABNORMAL HIGH

## 2011-03-22 LAB — HEPARIN LEVEL (UNFRACTIONATED)
Heparin Unfractionated: 0.21 — ABNORMAL LOW
Heparin Unfractionated: 0.22 — ABNORMAL LOW
Heparin Unfractionated: 0.26 — ABNORMAL LOW
Heparin Unfractionated: 0.48

## 2011-03-22 LAB — EXPECTORATED SPUTUM ASSESSMENT W GRAM STAIN, RFLX TO RESP C

## 2011-03-22 LAB — CULTURE, BLOOD (ROUTINE X 2)

## 2011-03-22 LAB — APTT: aPTT: 29

## 2011-03-22 LAB — URINE MICROSCOPIC-ADD ON

## 2011-03-22 LAB — CULTURE, RESPIRATORY W GRAM STAIN

## 2011-03-22 LAB — HEMOGLOBIN A1C: Hgb A1c MFr Bld: 8.1 — ABNORMAL HIGH

## 2011-03-22 LAB — HOMOCYSTEINE
Homocysteine: 13
Homocysteine: 17.9 — ABNORMAL HIGH

## 2011-03-22 LAB — LIPID PANEL
HDL: 47
Total CHOL/HDL Ratio: 3.3
Triglycerides: 78
VLDL: 16

## 2011-03-22 LAB — CK TOTAL AND CKMB (NOT AT ARMC)
CK, MB: 3.4
Total CK: 73

## 2011-03-22 LAB — TROPONIN I: Troponin I: 0.32 — ABNORMAL HIGH

## 2011-12-24 ENCOUNTER — Inpatient Hospital Stay (HOSPITAL_COMMUNITY): Payer: PRIVATE HEALTH INSURANCE

## 2011-12-24 ENCOUNTER — Emergency Department (HOSPITAL_COMMUNITY): Payer: PRIVATE HEALTH INSURANCE

## 2011-12-24 ENCOUNTER — Other Ambulatory Visit: Payer: Self-pay

## 2011-12-24 ENCOUNTER — Inpatient Hospital Stay (HOSPITAL_COMMUNITY)
Admission: EM | Admit: 2011-12-24 | Discharge: 2012-01-02 | DRG: 208 | Disposition: A | Discharge status: Home Health | Payer: PRIVATE HEALTH INSURANCE | Attending: Family Medicine | Admitting: Family Medicine

## 2011-12-24 ENCOUNTER — Encounter (HOSPITAL_COMMUNITY): Payer: Self-pay | Admitting: *Deleted

## 2011-12-24 DIAGNOSIS — Y92009 Unspecified place in unspecified non-institutional (private) residence as the place of occurrence of the external cause: Secondary | ICD-10-CM

## 2011-12-24 DIAGNOSIS — S78119A Complete traumatic amputation at level between unspecified hip and knee, initial encounter: Secondary | ICD-10-CM

## 2011-12-24 DIAGNOSIS — F1010 Alcohol abuse, uncomplicated: Secondary | ICD-10-CM | POA: Diagnosis present

## 2011-12-24 DIAGNOSIS — M6282 Rhabdomyolysis: Secondary | ICD-10-CM | POA: Diagnosis present

## 2011-12-24 DIAGNOSIS — I214 Non-ST elevation (NSTEMI) myocardial infarction: Secondary | ICD-10-CM | POA: Diagnosis present

## 2011-12-24 DIAGNOSIS — E872 Acidosis, unspecified: Secondary | ICD-10-CM

## 2011-12-24 DIAGNOSIS — T5891XA Toxic effect of carbon monoxide from unspecified source, accidental (unintentional), initial encounter: Secondary | ICD-10-CM

## 2011-12-24 DIAGNOSIS — I5020 Unspecified systolic (congestive) heart failure: Secondary | ICD-10-CM

## 2011-12-24 DIAGNOSIS — J9600 Acute respiratory failure, unspecified whether with hypoxia or hypercapnia: Secondary | ICD-10-CM | POA: Diagnosis present

## 2011-12-24 DIAGNOSIS — E46 Unspecified protein-calorie malnutrition: Secondary | ICD-10-CM | POA: Diagnosis present

## 2011-12-24 DIAGNOSIS — F17200 Nicotine dependence, unspecified, uncomplicated: Secondary | ICD-10-CM | POA: Diagnosis present

## 2011-12-24 DIAGNOSIS — R739 Hyperglycemia, unspecified: Secondary | ICD-10-CM

## 2011-12-24 DIAGNOSIS — E870 Hyperosmolality and hypernatremia: Secondary | ICD-10-CM | POA: Diagnosis not present

## 2011-12-24 DIAGNOSIS — G9340 Encephalopathy, unspecified: Secondary | ICD-10-CM | POA: Diagnosis present

## 2011-12-24 DIAGNOSIS — E1159 Type 2 diabetes mellitus with other circulatory complications: Secondary | ICD-10-CM | POA: Diagnosis present

## 2011-12-24 DIAGNOSIS — G9349 Other encephalopathy: Secondary | ICD-10-CM | POA: Diagnosis present

## 2011-12-24 DIAGNOSIS — T5894XA Toxic effect of carbon monoxide from unspecified source, undetermined, initial encounter: Secondary | ICD-10-CM | POA: Diagnosis present

## 2011-12-24 DIAGNOSIS — S88119A Complete traumatic amputation at level between knee and ankle, unspecified lower leg, initial encounter: Secondary | ICD-10-CM

## 2011-12-24 DIAGNOSIS — E876 Hypokalemia: Secondary | ICD-10-CM | POA: Diagnosis not present

## 2011-12-24 DIAGNOSIS — I428 Other cardiomyopathies: Secondary | ICD-10-CM | POA: Diagnosis present

## 2011-12-24 DIAGNOSIS — Z79899 Other long term (current) drug therapy: Secondary | ICD-10-CM

## 2011-12-24 DIAGNOSIS — Z7982 Long term (current) use of aspirin: Secondary | ICD-10-CM

## 2011-12-24 DIAGNOSIS — I509 Heart failure, unspecified: Secondary | ICD-10-CM | POA: Diagnosis present

## 2011-12-24 DIAGNOSIS — J705 Respiratory conditions due to smoke inhalation: Principal | ICD-10-CM

## 2011-12-24 DIAGNOSIS — I5022 Chronic systolic (congestive) heart failure: Secondary | ICD-10-CM | POA: Diagnosis present

## 2011-12-24 DIAGNOSIS — I10 Essential (primary) hypertension: Secondary | ICD-10-CM | POA: Diagnosis present

## 2011-12-24 DIAGNOSIS — J9690 Respiratory failure, unspecified, unspecified whether with hypoxia or hypercapnia: Secondary | ICD-10-CM

## 2011-12-24 DIAGNOSIS — I4891 Unspecified atrial fibrillation: Secondary | ICD-10-CM

## 2011-12-24 DIAGNOSIS — J439 Emphysema, unspecified: Secondary | ICD-10-CM | POA: Diagnosis present

## 2011-12-24 DIAGNOSIS — I498 Other specified cardiac arrhythmias: Secondary | ICD-10-CM | POA: Diagnosis not present

## 2011-12-24 DIAGNOSIS — Z794 Long term (current) use of insulin: Secondary | ICD-10-CM

## 2011-12-24 DIAGNOSIS — Z681 Body mass index (BMI) 19 or less, adult: Secondary | ICD-10-CM

## 2011-12-24 DIAGNOSIS — X001XXA Exposure to smoke in uncontrolled fire in building or structure, initial encounter: Secondary | ICD-10-CM

## 2011-12-24 DIAGNOSIS — R7989 Other specified abnormal findings of blood chemistry: Secondary | ICD-10-CM

## 2011-12-24 DIAGNOSIS — R748 Abnormal levels of other serum enzymes: Secondary | ICD-10-CM

## 2011-12-24 DIAGNOSIS — I69920 Aphasia following unspecified cerebrovascular disease: Secondary | ICD-10-CM

## 2011-12-24 DIAGNOSIS — I798 Other disorders of arteries, arterioles and capillaries in diseases classified elsewhere: Secondary | ICD-10-CM | POA: Diagnosis present

## 2011-12-24 DIAGNOSIS — E119 Type 2 diabetes mellitus without complications: Secondary | ICD-10-CM | POA: Diagnosis present

## 2011-12-24 HISTORY — DX: Alcohol abuse, uncomplicated: F10.10

## 2011-12-24 HISTORY — DX: Shortness of breath: R06.02

## 2011-12-24 HISTORY — DX: Heart failure, unspecified: I50.9

## 2011-12-24 HISTORY — DX: Essential (primary) hypertension: I10

## 2011-12-24 HISTORY — DX: Nicotine dependence, unspecified, uncomplicated: F17.200

## 2011-12-24 HISTORY — DX: Cardiac arrhythmia, unspecified: I49.9

## 2011-12-24 HISTORY — DX: Peripheral vascular disease, unspecified: I73.9

## 2011-12-24 HISTORY — DX: Chronic obstructive pulmonary disease, unspecified: J44.9

## 2011-12-24 HISTORY — DX: Cerebral infarction, unspecified: I63.9

## 2011-12-24 LAB — URINALYSIS, ROUTINE W REFLEX MICROSCOPIC
Glucose, UA: 250 mg/dL — AB
Ketones, ur: NEGATIVE mg/dL
Leukocytes, UA: NEGATIVE
Nitrite: NEGATIVE
Protein, ur: 100 mg/dL — AB
pH: 6 (ref 5.0–8.0)

## 2011-12-24 LAB — CBC
HCT: 40.7 % (ref 39.0–52.0)
Platelets: 183 10*3/uL (ref 150–400)
RBC: 4.72 MIL/uL (ref 4.22–5.81)
RDW: 14.1 % (ref 11.5–15.5)
WBC: 17.9 10*3/uL — ABNORMAL HIGH (ref 4.0–10.5)

## 2011-12-24 LAB — BLOOD GAS, ARTERIAL
Bicarbonate: 8.9 mEq/L — ABNORMAL LOW (ref 20.0–24.0)
Drawn by: 305991
MECHVT: 550 mL
PEEP: 5 cmH2O
Patient temperature: 98.6
RATE: 20 resp/min
pCO2 arterial: 31.5 mmHg — ABNORMAL LOW (ref 35.0–45.0)
pCO2 arterial: 23.3 mmHg — ABNORMAL LOW (ref 35.0–45.0)
pH, Arterial: 7.081 — CL (ref 7.350–7.450)
pH, Arterial: 7.495 — ABNORMAL HIGH (ref 7.350–7.450)

## 2011-12-24 LAB — CBC WITH DIFFERENTIAL/PLATELET
Basophils Absolute: 0 10*3/uL (ref 0.0–0.1)
Eosinophils Absolute: 0.1 10*3/uL (ref 0.0–0.7)
Lymphs Abs: 8.5 10*3/uL — ABNORMAL HIGH (ref 0.7–4.0)
MCH: 31.5 pg (ref 26.0–34.0)
MCHC: 33.8 g/dL (ref 30.0–36.0)
MCV: 93.4 fL (ref 78.0–100.0)
Monocytes Absolute: 1.2 10*3/uL — ABNORMAL HIGH (ref 0.1–1.0)
Monocytes Relative: 9 % (ref 3–12)
Neutro Abs: 3.6 10*3/uL (ref 1.7–7.7)
Platelets: 191 10*3/uL (ref 150–400)
RDW: 14.3 % (ref 11.5–15.5)
WBC: 13.4 10*3/uL — ABNORMAL HIGH (ref 4.0–10.5)

## 2011-12-24 LAB — CREATININE, SERUM: Creatinine, Ser: 0.88 mg/dL (ref 0.50–1.35)

## 2011-12-24 LAB — COMPREHENSIVE METABOLIC PANEL
Albumin: 3.8 g/dL (ref 3.5–5.2)
BUN: 11 mg/dL (ref 6–23)
Calcium: 10.3 mg/dL (ref 8.4–10.5)
Creatinine, Ser: 1 mg/dL (ref 0.50–1.35)
Glucose, Bld: 290 mg/dL — ABNORMAL HIGH (ref 70–99)
Total Protein: 8.2 g/dL (ref 6.0–8.3)

## 2011-12-24 LAB — POCT I-STAT TROPONIN I: Troponin i, poc: 0.03 ng/mL (ref 0.00–0.08)

## 2011-12-24 LAB — URINE MICROSCOPIC-ADD ON

## 2011-12-24 LAB — CARDIAC PANEL(CRET KIN+CKTOT+MB+TROPI)
CK, MB: 19.9 ng/mL (ref 0.3–4.0)
Relative Index: 2.2 (ref 0.0–2.5)
Total CK: 923 U/L — ABNORMAL HIGH (ref 7–232)

## 2011-12-24 LAB — GLUCOSE, CAPILLARY: Glucose-Capillary: 284 mg/dL — ABNORMAL HIGH (ref 70–99)

## 2011-12-24 LAB — PHOSPHORUS: Phosphorus: 2.6 mg/dL (ref 2.3–4.6)

## 2011-12-24 LAB — RAPID URINE DRUG SCREEN, HOSP PERFORMED
Barbiturates: NOT DETECTED
Cocaine: NOT DETECTED
Opiates: NOT DETECTED
Tetrahydrocannabinol: NOT DETECTED

## 2011-12-24 LAB — CARBOXYHEMOGLOBIN: Total hemoglobin: 14.6 g/dL (ref 13.5–18.0)

## 2011-12-24 LAB — LACTIC ACID, PLASMA: Lactic Acid, Venous: 1.7 mmol/L (ref 0.5–2.2)

## 2011-12-24 LAB — ACETAMINOPHEN LEVEL: Acetaminophen (Tylenol), Serum: <15 ug/mL (ref 10–30)

## 2011-12-24 LAB — CK: Total CK: 66 U/L (ref 7–232)

## 2011-12-24 LAB — MRSA PCR SCREENING: MRSA by PCR: NEGATIVE

## 2011-12-24 LAB — PROTIME-INR: INR: 1.02 (ref 0.00–1.49)

## 2011-12-24 MED ORDER — HEPARIN SODIUM (PORCINE) 5000 UNIT/ML IJ SOLN
5000.0000 [IU] | Freq: Three times a day (TID) | INTRAMUSCULAR | Status: DC
  Administered 2011-12-24 (×2): 5000 [IU] via SUBCUTANEOUS
  Filled 2011-12-24 (×2): qty 1

## 2011-12-24 MED ORDER — EPINEPHRINE HCL 0.1 MG/ML IJ SOLN
INTRAMUSCULAR | Status: AC
  Filled 2011-12-24: qty 40

## 2011-12-24 MED ORDER — INSULIN ASPART 100 UNIT/ML ~~LOC~~ SOLN: 0.0000 [IU] | SUBCUTANEOUS | Status: DC

## 2011-12-24 MED ORDER — BIOTENE DRY MOUTH MT LIQD: 15.0000 mL | OROMUCOSAL | Status: DC | PRN

## 2011-12-24 MED ORDER — SODIUM CHLORIDE 0.9 % IV SOLN: 250.0000 mL | INTRAVENOUS | Status: DC | PRN

## 2011-12-24 MED ORDER — SODIUM CHLORIDE 0.9 % IV SOLN
INTRAVENOUS | Status: DC
  Administered 2011-12-25: 11:00:00 via INTRAVENOUS

## 2011-12-24 MED ORDER — SODIUM CHLORIDE 0.9 % IV SOLN
INTRAVENOUS | Status: DC
  Administered 2011-12-24: 20:00:00 via INTRAVENOUS
  Filled 2011-12-24: qty 1

## 2011-12-24 MED ORDER — FENTANYL CITRATE 0.05 MG/ML IJ SOLN: 25.0000 ug | INTRAMUSCULAR | Status: DC | PRN

## 2011-12-24 MED ORDER — DEXTROSE 10 % IV SOLN: INTRAVENOUS | Status: DC | PRN

## 2011-12-24 MED ORDER — MIDAZOLAM HCL 2 MG/2ML IJ SOLN
2.0000 mg | Freq: Once | INTRAMUSCULAR | Status: AC
  Administered 2011-12-24: 2 mg via INTRAVENOUS

## 2011-12-24 MED ORDER — CHLORHEXIDINE GLUCONATE 0.12 % MT SOLN
15.0000 mL | Freq: Two times a day (BID) | OROMUCOSAL | Status: DC
  Administered 2011-12-24 – 2011-12-28 (×9): 15 mL via OROMUCOSAL
  Filled 2011-12-24 (×8): qty 15

## 2011-12-24 MED ORDER — DEXTROSE 50 % IV SOLN
INTRAVENOUS | Status: AC
  Filled 2011-12-24: qty 50

## 2011-12-24 MED ORDER — MIDAZOLAM HCL 2 MG/2ML IJ SOLN
INTRAMUSCULAR | Status: AC
  Filled 2011-12-24: qty 2

## 2011-12-24 MED ORDER — SODIUM CHLORIDE 0.9 % IV SOLN: INTRAVENOUS | Status: DC

## 2011-12-24 MED ORDER — PROPOFOL 10 MG/ML IV EMUL: 5.0000 ug/kg/min | Freq: Once | INTRAVENOUS | Status: DC

## 2011-12-24 MED ORDER — ATROPINE SULFATE 1 MG/ML IJ SOLN
INTRAMUSCULAR | Status: AC
  Filled 2011-12-24: qty 2

## 2011-12-24 MED ORDER — IPRATROPIUM BROMIDE HFA 17 MCG/ACT IN AERS
4.0000 | INHALATION_SPRAY | RESPIRATORY_TRACT | Status: DC | PRN
  Filled 2011-12-24: qty 12.9

## 2011-12-24 MED ORDER — PANTOPRAZOLE SODIUM 40 MG IV SOLR
40.0000 mg | Freq: Every day | INTRAVENOUS | Status: DC
  Administered 2011-12-24 – 2011-12-27 (×4): 40 mg via INTRAVENOUS
  Filled 2011-12-24 (×7): qty 40

## 2011-12-24 MED ORDER — SODIUM CHLORIDE 0.9 % IV BOLUS (SEPSIS)
1000.0000 mL | Freq: Once | INTRAVENOUS | Status: AC
  Administered 2011-12-24: 1000 mL via INTRAVENOUS

## 2011-12-24 MED ORDER — PROPOFOL 10 MG/ML IV EMUL
5.0000 ug/kg/min | INTRAVENOUS | Status: DC
  Administered 2011-12-24: 50 ug/kg/min via INTRAVENOUS
  Administered 2011-12-24: 5 ug/kg/min via INTRAVENOUS
  Administered 2011-12-25: 50 ug/kg/min via INTRAVENOUS
  Administered 2011-12-25: 60 ug/kg/min via INTRAVENOUS
  Administered 2011-12-26 (×3): 70 ug/kg/min via INTRAVENOUS
  Administered 2011-12-26: 65 ug/kg/min via INTRAVENOUS
  Filled 2011-12-24 (×12): qty 100

## 2011-12-24 MED ORDER — FENTANYL CITRATE 0.05 MG/ML IJ SOLN
INTRAMUSCULAR | Status: AC
  Filled 2011-12-24: qty 2

## 2011-12-24 MED ORDER — INSULIN ASPART 100 UNIT/ML ~~LOC~~ SOLN: 3.0000 [IU] | SUBCUTANEOUS | Status: DC

## 2011-12-24 MED ORDER — HYDRALAZINE HCL 20 MG/ML IJ SOLN
10.0000 mg | Freq: Four times a day (QID) | INTRAMUSCULAR | Status: DC | PRN
  Administered 2011-12-24: 10 mg via INTRAVENOUS
  Filled 2011-12-24: qty 0.5
  Filled 2011-12-24: qty 1

## 2011-12-24 MED ORDER — FENTANYL CITRATE 0.05 MG/ML IJ SOLN
100.0000 ug | Freq: Once | INTRAMUSCULAR | Status: AC
  Administered 2011-12-24: 100 ug via INTRAVENOUS

## 2011-12-24 NOTE — Progress Notes (Signed)
Elink MD notified of CKMB/TROP levels
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
## 2011-12-24 NOTE — Progress Notes (Signed)
EKG earlier today with nonspecific ST changes; cardiac enzymes positive; repeat EKG; cardiology consulted.
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
## 2011-12-24 NOTE — ED Notes (Signed)
Admitting MDs at the bedside.

## 2011-12-24 NOTE — Progress Notes (Signed)
RT NOTE:  Pt CO-OX value at this time is 21.7%. Results are not crossing over into computer system.
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
## 2011-12-24 NOTE — Progress Notes (Signed)
Hyperglycemia   Hyperglycemia protocol initiated

## 2011-12-25 ENCOUNTER — Inpatient Hospital Stay (HOSPITAL_COMMUNITY): Payer: PRIVATE HEALTH INSURANCE

## 2011-12-25 ENCOUNTER — Encounter (HOSPITAL_COMMUNITY): Payer: Self-pay | Admitting: *Deleted

## 2011-12-25 ENCOUNTER — Other Ambulatory Visit: Payer: Self-pay

## 2011-12-25 DIAGNOSIS — I519 Heart disease, unspecified: Secondary | ICD-10-CM

## 2011-12-25 DIAGNOSIS — R748 Abnormal levels of other serum enzymes: Secondary | ICD-10-CM

## 2011-12-25 LAB — CARDIAC PANEL(CRET KIN+CKTOT+MB+TROPI)
Relative Index: 1 (ref 0.0–2.5)
Relative Index: 0.6 (ref 0.0–2.5)
Total CK: 943 U/L — ABNORMAL HIGH (ref 7–232)

## 2011-12-25 LAB — BASIC METABOLIC PANEL
CO2: 19 mEq/L (ref 19–32)
CO2: 16 mEq/L — ABNORMAL LOW (ref 19–32)
Chloride: 107 mEq/L (ref 96–112)
Glucose, Bld: 94 mg/dL (ref 70–99)
Glucose, Bld: 111 mg/dL — ABNORMAL HIGH (ref 70–99)
Potassium: 2.4 mEq/L — CL (ref 3.5–5.1)
Potassium: 4.4 mEq/L (ref 3.5–5.1)
Sodium: 144 mEq/L (ref 135–145)
Sodium: 142 mEq/L (ref 135–145)

## 2011-12-25 LAB — URINALYSIS, ROUTINE W REFLEX MICROSCOPIC
Bilirubin Urine: NEGATIVE
Ketones, ur: NEGATIVE mg/dL
Protein, ur: NEGATIVE mg/dL
Urobilinogen, UA: 0.2 mg/dL (ref 0.0–1.0)

## 2011-12-25 LAB — POCT I-STAT 3, ART BLOOD GAS (G3+)
Acid-base deficit: 1 mmol/L (ref 0.0–2.0)
Bicarbonate: 21.8 mEq/L (ref 20.0–24.0)
Patient temperature: 98.7
TCO2: 23 mmol/L (ref 0–100)

## 2011-12-25 LAB — GLUCOSE, CAPILLARY
Glucose-Capillary: 115 mg/dL — ABNORMAL HIGH (ref 70–99)
Glucose-Capillary: 91 mg/dL (ref 70–99)
Glucose-Capillary: 102 mg/dL — ABNORMAL HIGH (ref 70–99)
Glucose-Capillary: 107 mg/dL — ABNORMAL HIGH (ref 70–99)
Glucose-Capillary: 87 mg/dL (ref 70–99)
Glucose-Capillary: 147 mg/dL — ABNORMAL HIGH (ref 70–99)

## 2011-12-25 LAB — BLOOD GAS, ARTERIAL
Drawn by: 24513
MECHVT: 550 mL
O2 Saturation: 99.7 %
PEEP: 5 cmH2O
Patient temperature: 98.6
RATE: 20 resp/min

## 2011-12-25 LAB — CBC
Hemoglobin: 14.4 g/dL (ref 13.0–17.0)
Hemoglobin: 14 g/dL (ref 13.0–17.0)
MCHC: 33.5 g/dL (ref 30.0–36.0)
RBC: 4.75 MIL/uL (ref 4.22–5.81)
RDW: 14.2 % (ref 11.5–15.5)

## 2011-12-25 LAB — URINE MICROSCOPIC-ADD ON

## 2011-12-25 LAB — HEPARIN LEVEL (UNFRACTIONATED): Heparin Unfractionated: 0.29 IU/mL — ABNORMAL LOW (ref 0.30–0.70)

## 2011-12-25 LAB — HEMOGLOBIN A1C: Hgb A1c MFr Bld: 7.7 % — ABNORMAL HIGH (ref <5.7)

## 2011-12-25 MED ORDER — HEPARIN (PORCINE) IN NACL 100-0.45 UNIT/ML-% IJ SOLN
800.0000 [IU]/h | INTRAMUSCULAR | Status: DC
  Administered 2011-12-25: 850 [IU]/h via INTRAVENOUS
  Administered 2011-12-25: 700 [IU]/h via INTRAVENOUS
  Administered 2011-12-26: 800 [IU]/h via INTRAVENOUS
  Filled 2011-12-25 (×2): qty 250

## 2011-12-25 MED ORDER — SODIUM PHOSPHATE 3 MMOLE/ML IV SOLN
30.0000 mmol | Freq: Once | INTRAVENOUS | Status: AC
  Administered 2011-12-25: 30 mmol via INTRAVENOUS
  Filled 2011-12-25: qty 10

## 2011-12-25 MED ORDER — SODIUM CHLORIDE 0.9 % IV SOLN
500.0000 mg | Freq: Two times a day (BID) | INTRAVENOUS | Status: DC
  Administered 2011-12-25 – 2011-12-27 (×4): 500 mg via INTRAVENOUS
  Filled 2011-12-25 (×5): qty 500

## 2011-12-25 MED ORDER — INSULIN GLARGINE 100 UNIT/ML ~~LOC~~ SOLN
10.0000 [IU] | SUBCUTANEOUS | Status: DC
  Administered 2011-12-25 – 2012-01-01 (×8): 10 [IU] via SUBCUTANEOUS

## 2011-12-25 MED ORDER — PIPERACILLIN-TAZOBACTAM 3.375 G IVPB
3.3750 g | Freq: Three times a day (TID) | INTRAVENOUS | Status: DC
  Administered 2011-12-25 – 2011-12-28 (×9): 3.375 g via INTRAVENOUS
  Filled 2011-12-25 (×11): qty 50

## 2011-12-25 MED ORDER — INSULIN ASPART 100 UNIT/ML ~~LOC~~ SOLN
0.0000 [IU] | SUBCUTANEOUS | Status: DC
  Administered 2011-12-27: 1 [IU] via SUBCUTANEOUS

## 2011-12-25 MED ORDER — ACETAMINOPHEN 650 MG RE SUPP
650.0000 mg | Freq: Four times a day (QID) | RECTAL | Status: DC | PRN
  Administered 2011-12-25 – 2011-12-29 (×7): 650 mg via RECTAL
  Filled 2011-12-25 (×4): qty 1

## 2011-12-25 MED ORDER — ASPIRIN 325 MG PO TABS: 325.0000 mg | ORAL_TABLET | Freq: Every day | ORAL | Status: DC

## 2011-12-25 MED ORDER — HEPARIN BOLUS VIA INFUSION
2000.0000 [IU] | Freq: Once | INTRAVENOUS | Status: AC
  Administered 2011-12-25: 2000 [IU] via INTRAVENOUS
  Filled 2011-12-25: qty 2000

## 2011-12-25 MED ORDER — DEXTROSE 10 % IV SOLN: INTRAVENOUS | Status: DC | PRN

## 2011-12-25 MED ORDER — POTASSIUM CHLORIDE 10 MEQ/100ML IV SOLN
10.0000 meq | INTRAVENOUS | Status: AC
  Administered 2011-12-25 (×5): 10 meq via INTRAVENOUS
  Filled 2011-12-25: qty 400
  Filled 2011-12-25: qty 100

## 2011-12-25 MED ORDER — ASPIRIN 300 MG RE SUPP
300.0000 mg | Freq: Every day | RECTAL | Status: DC
  Administered 2011-12-25 – 2011-12-28 (×4): 300 mg via RECTAL
  Filled 2011-12-25 (×5): qty 1

## 2011-12-25 MED ORDER — POTASSIUM CHLORIDE 10 MEQ/100ML IV SOLN
INTRAVENOUS | Status: AC
  Administered 2011-12-25: 10 meq
  Filled 2011-12-25: qty 100

## 2011-12-25 NOTE — Progress Notes (Signed)
CRITICAL VALUE ALERT  Critical value received:  K+ 2.4  Date of notification:  12/25/2011  Time of notification:  0510  Critical value read back:yes  Nurse who received alert:  Okey Dupre. RN  MD notified (1st page):  Dr. Darrick Penna  Time of first page:  0510  MD notified (2nd page):  Time of second page:  Responding MD:  Dr. Darrick Penna  Time MD responded:  [phone]

## 2011-12-25 NOTE — Progress Notes (Signed)
ANTICOAGULATION CONSULT NOTE - Follow Up Consult  Pharmacy Consult for Heparin Indication: chest pain/ACS  No Known Allergies  Patient Measurements: Height: 6' (182.9 cm) Weight: 106 lb 0.7 oz (48.1 kg) (weighed on bedscale) IBW/kg (Calculated) : 77.6    Vital Signs: Temp: 98.8 F (37.1 C) (07/21 1100) Temp src: Core (Comment) (07/21 0400) BP: 114/81 mmHg (07/21 1100) Pulse Rate: 68  (07/21 1100)  Labs:  Basename 12/25/11 0840 12/25/11 0400 12/24/11 1814 12/24/11 1532 12/24/11 1252 12/24/11 0009  HGB 14.0 14.4 -- -- -- --  HCT 41.8 40.7 40.7 -- -- --  PLT 186 187 183 -- -- --  APTT -- -- -- -- -- --  LABPROT -- -- 13.6 -- -- --  INR -- -- 1.02 -- -- --  HEPARINUNFRC 0.29* -- -- -- -- --  CREATININE -- 0.73 -- 0.88 1.00 --  CKTOTAL 943* -- 923* -- 66 --  CKMB 5.3* -- 19.9* -- -- 10.1*  TROPONINI 5.46* -- 3.90* -- -- 13.40*    Estimated Creatinine Clearance: 65.1 ml/min (by C-G formula based on Cr of 0.73).   Medications:  Infusions:    . sodium chloride 50 mL/hr at 12/25/11 1035  . dextrose    . dextrose    . dextrose    . heparin 700 Units/hr (12/25/11 0700)  . insulin (NOVOLIN-R) infusion 0.3 mL/hr at 12/25/11 0500  . propofol 50 mcg/kg/min (12/25/11 1610)  . DISCONTD: sodium chloride    . DISCONTD: insulin (NOVOLIN-R) infusion      Assessment: NSTEMI vs. Demand ischemia, Atrial Fibrillation - Initial heparin level is slightly subtherapeutic.  Goal of Therapy:  Heparin level 0.3-0.7 units/ml Monitor platelets by anticoagulation protocol: Yes   Plan:  Heparin 2000 units IV bolus x 1 Increase Heparin drip to 850 units/hr Check Heparin level in 6 hours  Estella Husk, Pharm.D., BCPS Clinical Pharmacist  Phone [phone] Pager [phone] 12/25/2011, 11:31 AM

## 2011-12-25 NOTE — Progress Notes (Signed)
ANTIBIOTIC CONSULT NOTE - INITIAL  Pharmacy Consult for Vancomycin and Zosyn Indication: rule out pneumonia  No Known Allergies  Patient Measurements: Height: 6' (182.9 cm) Weight: 106 lb 0.7 oz (48.1 kg) (weighed on bedscale) IBW/kg (Calculated) : 77.6   Vital Signs: Temp: 98.6 F (37 C) (07/21 1200) Temp src: Core (Comment) (07/21 0400) BP: 136/80 mmHg (07/21 1200) Pulse Rate: 77  (07/21 1200) Intake/Output from previous day: 07/20 0701 - 07/21 0700 In: 1264.6 [I.V.:794.6; IV Piggyback:470] Out: 1145 [Urine:1145] Intake/Output from this shift: Total I/O In: 580 [I.V.:180; IV Piggyback:400] Out: 350 [Urine:350]  Labs:  [facility] 12/25/11 0840 12/25/11 0400 12/24/11 1814 12/24/11 1532 12/24/11 1252  WBC 18.5* 16.4* 17.9* -- --  HGB 14.0 14.4 14.0 -- --  PLT 186 187 183 -- --  LABCREA -- -- -- -- --  CREATININE -- 0.73 -- 0.88 1.00   Estimated Creatinine Clearance: 65.1 ml/min (by C-G formula based on Cr of 0.73). No results found for this basename: VANCOTROUGH:2,VANCOPEAK:2,VANCORANDOM:2,GENTTROUGH:2,GENTPEAK:2,GENTRANDOM:2,TOBRATROUGH:2,TOBRAPEAK:2,TOBRARND:2,AMIKACINPEAK:2,AMIKACINTROU:2,AMIKACIN:2, in the last 72 hours   Microbiology: Recent Results (from the past 720 hour(s))  MRSA PCR SCREENING     Status: Normal   Collection Time   12/24/11  4:38 PM      Component Value Range Status Comment   MRSA by PCR NEGATIVE  NEGATIVE Final     Medical History: Past Medical History  Diagnosis Date  . Active smoker   . Alcohol abuse, episodic   . Difficult intubation   . Hypertension   . Dysrhythmia   . COPD (chronic obstructive pulmonary disease)   . Shortness of breath   . Diabetes mellitus   . Stroke   . Peripheral vascular disease   . CHF (congestive heart failure)     Medications:  Anti-infectives    None     Assessment: Empiric antibiotics, r/o PNA - To begin empiric coverage with Vancomycin and Zosyn for fevers, elevated WBC, and pulmonary injury.   Note he has a low body weight due to bilateral lower extremity amputations which complicates estimation of his renal function.  Goal of Therapy:  Vancomycin trough level 15-20 mcg/ml  Plan:  Vancomycin 500mg  IV q12h Zosyn 3.375gm IV q8h extended infusion Monitor renal function and urine output. Follow-up culture results.  Estella Husk, Pharm.D., BCPS Clinical Pharmacist  Phone [phone] Pager [phone] 12/25/2011, 12:33 PM
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
## 2011-12-25 NOTE — Progress Notes (Signed)
Called Dr. Darrick Penna will panic values of Ph 7.61 and CO2 of 17.8.  She advised she would place new orders.  Advised RT.  Will continue to monitor.
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
## 2011-12-25 NOTE — Progress Notes (Signed)
  Echocardiogram 2D Echocardiogram has been performed.  [name] 12/25/2011, 12:03 PM
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
## 2011-12-25 NOTE — Progress Notes (Signed)
IV therapy notified of need for PICC insertion, sputum and urine to lab

## 2011-12-25 NOTE — Progress Notes (Signed)
Advised Dr. Darrick Penna of pt temp of 101.8.  Received orders for tylenol PR.  Will administer and continue to monitor.

## 2011-12-26 ENCOUNTER — Inpatient Hospital Stay (HOSPITAL_COMMUNITY): Payer: PRIVATE HEALTH INSURANCE

## 2011-12-26 DIAGNOSIS — R7309 Other abnormal glucose: Secondary | ICD-10-CM

## 2011-12-26 LAB — BASIC METABOLIC PANEL
BUN: 7 mg/dL (ref 6–23)
Chloride: 107 mEq/L (ref 96–112)
Creatinine, Ser: 0.81 mg/dL (ref 0.50–1.35)
GFR calc Af Amer: >90 mL/min (ref >90)
GFR calc non Af Amer: >90 mL/min (ref >90)
Potassium: 3.4 mEq/L — ABNORMAL LOW (ref 3.5–5.1)

## 2011-12-26 LAB — GLUCOSE, CAPILLARY
Glucose-Capillary: 115 mg/dL — ABNORMAL HIGH (ref 70–99)
Glucose-Capillary: 129 mg/dL — ABNORMAL HIGH (ref 70–99)
Glucose-Capillary: 145 mg/dL — ABNORMAL HIGH (ref 70–99)
Glucose-Capillary: 105 mg/dL — ABNORMAL HIGH (ref 70–99)

## 2011-12-26 LAB — HEPARIN LEVEL (UNFRACTIONATED): Heparin Unfractionated: 0.44 IU/mL (ref 0.30–0.70)

## 2011-12-26 LAB — CBC
MCHC: 33.6 g/dL (ref 30.0–36.0)
Platelets: 160 10*3/uL (ref 150–400)
RDW: 15 % (ref 11.5–15.5)
WBC: 17.9 10*3/uL — ABNORMAL HIGH (ref 4.0–10.5)

## 2011-12-26 LAB — PROTIME-INR: Prothrombin Time: 15.4 seconds — ABNORMAL HIGH (ref 11.6–15.2)

## 2011-12-26 MED ORDER — METOPROLOL TARTRATE 1 MG/ML IV SOLN
2.5000 mg | Freq: Four times a day (QID) | INTRAVENOUS | Status: DC
  Administered 2011-12-26 – 2011-12-27 (×3): 2.5 mg via INTRAVENOUS
  Filled 2011-12-26 (×6): qty 5

## 2011-12-26 MED ORDER — HEPARIN (PORCINE) IN NACL 100-0.45 UNIT/ML-% IJ SOLN
850.0000 [IU]/h | INTRAMUSCULAR | Status: DC
  Filled 2011-12-26: qty 250

## 2011-12-26 MED ORDER — POTASSIUM CHLORIDE 10 MEQ/100ML IV SOLN
10.0000 meq | INTRAVENOUS | Status: AC
  Administered 2011-12-26 (×3): 10 meq via INTRAVENOUS
  Filled 2011-12-26: qty 100
  Filled 2011-12-26: qty 200

## 2011-12-26 MED ORDER — HEPARIN BOLUS VIA INFUSION
2000.0000 [IU] | Freq: Once | INTRAVENOUS | Status: AC
  Administered 2011-12-26: 2000 [IU] via INTRAVENOUS
  Filled 2011-12-26: qty 2000

## 2011-12-26 MED ORDER — HEPARIN (PORCINE) IN NACL 100-0.45 UNIT/ML-% IJ SOLN
800.0000 [IU]/h | INTRAMUSCULAR | Status: AC
  Filled 2011-12-26: qty 250
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
## 2011-12-26 NOTE — Progress Notes (Signed)
650mg  PR tylenol given for increased temp.  Will continue to monitor.
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
## 2011-12-26 NOTE — Progress Notes (Addendum)
ANTICOAGULATION CONSULT NOTE - Follow Up Consult  Pharmacy Consult for Heparin Indication: chest pain/ACS  No Known Allergies  Patient Measurements: Height: 6' (182.9 cm) Weight: 109 lb 9.6 oz (49.714 kg) IBW/kg (Calculated) : 77.6    Vital Signs: Temp: 99.1 F (37.3 C) (07/22 0814) Temp src: Other (Comment) (07/22 0742) BP: 89/62 mmHg (07/22 0814) Pulse Rate: 75  (07/22 0814)  Labs:  Basename 12/26/11 0303 12/25/11 1812 12/25/11 0840 12/25/11 0400 12/24/11 1814 12/24/11 0009  HGB 13.1 -- 14.0 -- -- --  HCT 39.0 -- 41.8 40.7 -- --  PLT 160 -- 186 187 -- --  APTT -- -- -- -- -- --  LABPROT 15.4* -- -- -- 13.6 --  INR 1.19 -- -- -- 1.02 --  HEPARINUNFRC 0.44 0.65 0.29* -- -- --  CREATININE 0.81 0.77 -- 0.73 -- --  CKTOTAL 522* -- 943* -- 923* --  CKMB -- -- 5.3* -- 19.9* 10.1*  TROPONINI -- -- 5.46* -- 3.90* 13.40*    Estimated Creatinine Clearance: 66.5 ml/min (by C-G formula based on Cr of 0.81).   Medications:  Infusions:     . sodium chloride 50 mL/hr at 12/25/11 1035  . propofol 70 mcg/kg/min (12/26/11 0700)  . DISCONTD: dextrose    . DISCONTD: dextrose    . DISCONTD: dextrose    . DISCONTD: heparin 800 Units/hr (12/26/11 0536)  . DISCONTD: insulin (NOVOLIN-R) infusion 0.3 mL/hr at 12/25/11 0500    Assessment: NSTEMI vs. Demand ischemia, Atrial Fibrillation - Heparin level therapeutic.  Noted plans to hold heparin for OR today.  Goal of Therapy:  Heparin level 0.3-0.7 units/ml Monitor platelets by anticoagulation protocol: Yes   Plan:  Cont heparin drip to 850 units/hr F/u after OR.  Talbert Cage, Pharm.D. Clinical Pharmacist  Phone [phone] Pager [phone] 12/26/2011, 8:41 AM   Addum:  Heparin restarted at previous rate postop.  Will check heparin level 8 hours after restart.

## 2011-12-26 NOTE — Progress Notes (Signed)
eLink Physician-Brief Progress Note Patient Name: [name] DOB: 11-Aug-1950 MRN: [id number]  Date of Service  12/26/2011   HPI/Events of Note   Atrial fibrilliation and needs heparin drip.  Pt did not go to OR for trach per ENT  eICU Interventions  ENT wants Fob done prior to OR trip to determine extubatabliity  Resume heparin drip   Intervention Category Major Interventions: Arrhythmia - evaluation and management  Shan Levans 12/26/2011, 8:18 PM

## 2011-12-27 ENCOUNTER — Inpatient Hospital Stay (HOSPITAL_COMMUNITY): Payer: PRIVATE HEALTH INSURANCE | Admitting: Certified Registered"

## 2011-12-27 ENCOUNTER — Encounter (HOSPITAL_COMMUNITY): Payer: Self-pay | Admitting: *Deleted

## 2011-12-27 ENCOUNTER — Encounter (HOSPITAL_COMMUNITY): Payer: Self-pay | Admitting: Certified Registered"

## 2011-12-27 ENCOUNTER — Encounter (HOSPITAL_COMMUNITY)
Admission: EM | Disposition: A | Discharge status: Home Health | Payer: Self-pay | Source: Home / Self Care | Attending: Family Medicine

## 2011-12-27 DIAGNOSIS — J9600 Acute respiratory failure, unspecified whether with hypoxia or hypercapnia: Secondary | ICD-10-CM

## 2011-12-27 HISTORY — PX: LARYNGOSCOPY: SHX5203

## 2011-12-27 LAB — BASIC METABOLIC PANEL
BUN: 8 mg/dL (ref 6–23)
Chloride: 110 mEq/L (ref 96–112)
GFR calc Af Amer: >90 mL/min (ref >90)
GFR calc non Af Amer: >90 mL/min (ref >90)
Potassium: 5.6 mEq/L — ABNORMAL HIGH (ref 3.5–5.1)
Sodium: 142 mEq/L (ref 135–145)

## 2011-12-27 LAB — GLUCOSE, CAPILLARY
Glucose-Capillary: 113 mg/dL — ABNORMAL HIGH (ref 70–99)
Glucose-Capillary: 126 mg/dL — ABNORMAL HIGH (ref 70–99)
Glucose-Capillary: 130 mg/dL — ABNORMAL HIGH (ref 70–99)

## 2011-12-27 LAB — URINE CULTURE
Colony Count: NO GROWTH
Culture: NO GROWTH

## 2011-12-27 LAB — CULTURE, RESPIRATORY W GRAM STAIN

## 2011-12-27 LAB — CBC
HCT: 40.5 % (ref 39.0–52.0)
Hemoglobin: 13.6 g/dL (ref 13.0–17.0)
MCH: 29.6 pg (ref 26.0–34.0)
MCV: 88 fL (ref 78.0–100.0)
RBC: 4.6 MIL/uL (ref 4.22–5.81)
WBC: 14.4 10*3/uL — ABNORMAL HIGH (ref 4.0–10.5)

## 2011-12-27 SURGERY — LARYNGOSCOPY
Anesthesia: General | Site: Throat | Wound class: Clean Contaminated

## 2011-12-27 MED ORDER — ROCURONIUM BROMIDE 100 MG/10ML IV SOLN
INTRAVENOUS | Status: DC | PRN
  Administered 2011-12-27: 30 mg via INTRAVENOUS
  Administered 2011-12-27: 20 mg via INTRAVENOUS

## 2011-12-27 MED ORDER — HYDRALAZINE HCL 20 MG/ML IJ SOLN
10.0000 mg | Freq: Four times a day (QID) | INTRAMUSCULAR | Status: DC | PRN
  Administered 2011-12-29: 10 mg via INTRAVENOUS
  Administered 2011-12-30 – 2011-12-31 (×2): 20 mg via INTRAVENOUS
  Filled 2011-12-27 (×3): qty 1

## 2011-12-27 MED ORDER — PROPOFOL 10 MG/ML IV EMUL
INTRAVENOUS | Status: DC | PRN
  Administered 2011-12-27: 55 ug/kg/min via INTRAVENOUS

## 2011-12-27 MED ORDER — METOPROLOL TARTRATE 1 MG/ML IV SOLN: 5.0000 mg | Freq: Four times a day (QID) | INTRAVENOUS | Status: DC

## 2011-12-27 MED ORDER — 0.9 % SODIUM CHLORIDE (POUR BTL) OPTIME
TOPICAL | Status: DC | PRN
  Administered 2011-12-27: 1000 mL

## 2011-12-27 MED ORDER — LISINOPRIL 40 MG PO TABS
40.0000 mg | ORAL_TABLET | Freq: Every day | ORAL | Status: DC
  Filled 2011-12-27: qty 1

## 2011-12-27 MED ORDER — DIGOXIN 125 MCG PO TABS
0.1250 mg | ORAL_TABLET | Freq: Every day | ORAL | Status: DC
  Administered 2011-12-28 – 2011-12-29 (×2): 0.125 mg via ORAL
  Filled 2011-12-27 (×3): qty 1

## 2011-12-27 MED ORDER — METOPROLOL TARTRATE 1 MG/ML IV SOLN
INTRAVENOUS | Status: DC | PRN
  Administered 2011-12-27: 1 mg via INTRAVENOUS

## 2011-12-27 MED ORDER — INSULIN GLARGINE 100 UNIT/ML ~~LOC~~ SOLN: 75.0000 [IU] | Freq: Two times a day (BID) | SUBCUTANEOUS | Status: DC

## 2011-12-27 MED ORDER — PROPOFOL 10 MG/ML IV BOLUS
INTRAVENOUS | Status: DC | PRN
  Administered 2011-12-27: 50 mg via INTRAVENOUS

## 2011-12-27 MED ORDER — HEPARIN (PORCINE) IN NACL 100-0.45 UNIT/ML-% IJ SOLN
1100.0000 [IU]/h | INTRAMUSCULAR | Status: DC
  Administered 2011-12-27: 850 [IU]/h via INTRAVENOUS
  Administered 2011-12-29: 1000 [IU]/h via INTRAVENOUS
  Filled 2011-12-27 (×3): qty 250

## 2011-12-27 MED ORDER — METOPROLOL TARTRATE 25 MG PO TABS
25.0000 mg | ORAL_TABLET | Freq: Two times a day (BID) | ORAL | Status: DC
  Filled 2011-12-27 (×2): qty 1

## 2011-12-27 SURGICAL SUPPLY — 26 items
BALLN PULM 15 16.5 18X75 (BALLOONS)
BALLOON PULM 15 16.5 18X75 (BALLOONS) IMPLANT
CANISTER SUCTION 2500CC (MISCELLANEOUS) ×2 IMPLANT
CLOTH BEACON ORANGE TIMEOUT ST (SAFETY) ×2 IMPLANT
CONT SPEC 4OZ CLIKSEAL STRL BL (MISCELLANEOUS) IMPLANT
COVER TABLE BACK 60X90 (DRAPES) ×2 IMPLANT
CRADLE DONUT ADULT HEAD (MISCELLANEOUS) IMPLANT
DEPRESSOR TONGUE BLADE STERILE (MISCELLANEOUS) IMPLANT
DRAPE PROXIMA HALF (DRAPES) ×2 IMPLANT
GLOVE BIO SURGEON STRL SZ7.5 (GLOVE) ×2 IMPLANT
GOWN STRL NON-REIN LRG LVL3 (GOWN DISPOSABLE) IMPLANT
GUARD TEETH (MISCELLANEOUS) ×1 IMPLANT
KIT BASIN OR (CUSTOM PROCEDURE TRAY) ×2 IMPLANT
KIT ROOM TURNOVER OR (KITS) ×2 IMPLANT
MARKER SKIN DUAL TIP RULER LAB (MISCELLANEOUS) IMPLANT
NS IRRIG 1000ML POUR BTL (IV SOLUTION) ×2 IMPLANT
PAD ARMBOARD 7.5X6 YLW CONV (MISCELLANEOUS) ×4 IMPLANT
PAD EYE OVAL STERILE LF (GAUZE/BANDAGES/DRESSINGS) IMPLANT
PATTIES SURGICAL .5 X.5 (GAUZE/BANDAGES/DRESSINGS) ×2 IMPLANT
PATTIES SURGICAL .5 X3 (DISPOSABLE) IMPLANT
SOLUTION ANTI FOG 6CC (MISCELLANEOUS) IMPLANT
SPONGE GAUZE 4X4 12PLY (GAUZE/BANDAGES/DRESSINGS) IMPLANT
SYR INFLATE BILIARY GAUGE (MISCELLANEOUS) IMPLANT
TOWEL OR 17X24 6PK STRL BLUE (TOWEL DISPOSABLE) ×4 IMPLANT
TUBE CONNECTING 12X1/4 (SUCTIONS) ×2 IMPLANT
WATER STERILE IRR 1000ML POUR (IV SOLUTION) ×2 IMPLANT

## 2011-12-27 NOTE — Progress Notes (Signed)
Patient ID: [name], male   DOB: 1950/08/19, 61 y.o.   MRN: [id number]    Subjective:  The patient is intubated. There is minimal communication but hi nurse reports that he is improving. He more alert and follows commands. His daughter [name]) who does not stay awake the patient, present in the patient's room. She lives separately 15 minutes away from him in [place]. No overnight events. Objective:  Vital Signs in the last 24 hours: Temp:  [99.3 F (37.4 C)-103.3 F (39.6 C)] 103.3 F (39.6 C) (07/23 1400) Pulse Rate:  [46-103] 103  (07/23 1400) Resp:  [13-30] 26  (07/23 1400) BP: (111-169)/(62-92) 133/85 mmHg (07/23 1400) SpO2:  [92 %-100 %] 98 % (07/23 1400) FiO2 (%):  [40 %] 40 % (07/23 1444) Weight:  [104 lb 8 oz (47.4 kg)] 104 lb 8 oz (47.4 kg) (07/23 0500)  Intake/Output from previous day: 07/22 0701 - 07/23 0700 In: 1866.8 [I.V.:1491.8; IV Piggyback:375] Out: 945 [Urine:945]  Physical Exam: Pt is intubated, noncommunicative but follows commands.  HEENT: normal Neck: JVP - normal, carotids 2+= without bruits Lungs: CTA bilaterally CV: RRR without murmur or gallop Abd: soft, NT, Positive BS, no hepatomegaly Ext: no C/C/E, distal pulses intact and equal Skin: warm/dry no rash   Lab Results:  Basename 12/27/11 0405 12/26/11 0303  WBC 14.4* 17.9*  HGB 13.6 13.1  PLT 156 160    Basename 12/27/11 1000 12/26/11 0303  NA 142 140  K 5.6* 3.4*  CL 110 107  CO2 13* 17*  GLUCOSE 149* 144*  BUN 8 7  CREATININE 0.80 0.81    Basename 12/25/11 0840 12/24/11 1814  TROPONINI 5.46* 3.90*    Cardiac Studies: Echocardiogram: Left ventricle: The cavity size was normal. Wall thickness was normal. Systolic function was moderately to severely reduced. The estimated ejection fraction was in the range of 30% to 35%. Diffuse hypokinesis. - Aortic valve: There was no stenosis. - Mitral valve: Mildly calcified annulus. Trivial regurgitation. - Left atrium: The  atrium was mildly dilated. - Right ventricle: The cavity size was normal. Systolic function was mildly reduced. - Tricuspid valve: Peak RV-RA gradient:28mm Hg (S). - Pulmonary arteries: PA systolic pressure 29-33 mmHg. - Systemic veins: IVC measured 2.1 cm with < 50% respirophasic variation, suggesting RA pressure 11-15 mmHg. Impressions:  - Normal LV size with moderate to severe systolic dysfunction, EF 30-35%. Diffuse hypokinesis. Normal RV size with mild systolic dysfunction  tele: Still shows atrial fibrillationof we have irregular ventricular response in the range is over 100 beats a minute and LVH.  Assessment/Plan:  Elevated cardiac enzymes: The cause of his elevated cardiac enzymes was felt to be due to be demand ischemia given the patient's presentation with multiple co-morbidities.The patient's cardiac enzymes improved from a CK-MB of 19 on admission to 5.3 in 1 day and troponin also reduced from 13.4 to 5.6. This is reassuring for his cardiac issues. There were no acute ST segment changes on EKG to indicate acute myocardial infarction. Also noted the echocardiogram results indicating an EF of 30 to 35% with diffuse hypokinesis. However the patient does not show clinical signs of heart failure at the moment.  As the patient continued to receive supportive care his cardiac enzymes improved which further suggests initial demand myocardial injury. Will do proBNP for heart failure  We will do repeat cardiac enzymes. To continue with aspirin and heparin infusion.   Patient to continue with other medical care as per CCM.   Dow Adolph, M.D. 12/27/2011,  3:48 PM   Patient seen, examined. Available data reviewed. Agree with findings, assessment, and plan as outlined by Dr Zada Girt. Suspect stress cardiomyopathy rather than acute coronary event. Continue supportive care. This patient will not be a candidate for long-term anticoagulation for atrial fibrillation based on his past  history of medical noncompliance and polysubstance abuse. Will continue to follow from distance.  Tonny Bollman, M.D. 12/27/2011 11:37 PM
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
## 2011-12-27 NOTE — Anesthesia Preprocedure Evaluation (Signed)
Anesthesia Evaluation  Patient identified by MRN, date of birth, ID band Patient unresponsive    Reviewed: Allergy & Precautions, H&P , NPO status , Patient's Chart, lab work & pertinent test results  History of Anesthesia Complications (+) DIFFICULT AIRWAY  Airway      Comment: Nasal ETT in situ Dental   Pulmonary shortness of breath, COPD         Cardiovascular hypertension, +CHF + dysrhythmias Atrial Fibrillation Rhythm:Irregular Rate:Normal     Neuro/Psych CVA    GI/Hepatic   Endo/Other    Renal/GU      Musculoskeletal   Abdominal   Peds  Hematology   Anesthesia Other Findings   Reproductive/Obstetrics                           Anesthesia Physical Anesthesia Plan  ASA: IV  Anesthesia Plan: General   Post-op Pain Management:    Induction: Intravenous and Inhalational  Airway Management Planned:   Additional Equipment:   Intra-op Plan:   Post-operative Plan: Post-operative intubation/ventilation  Informed Consent: I have reviewed the patients History and Physical, chart, labs and discussed the procedure including the risks, benefits and alternatives for the proposed anesthesia with the patient or authorized representative who has indicated his/her understanding and acceptance.     Plan Discussed with: Anesthesiologist and Surgeon  Anesthesia Plan Comments:         Anesthesia Quick Evaluation

## 2011-12-27 NOTE — Progress Notes (Signed)
ANTICOAGULATION CONSULT NOTE - Follow Up Consult  Pharmacy Consult for Heparin Indication: chest pain/ACS  No Known Allergies  Patient Measurements: Height: 6' (182.9 cm) Weight: 104 lb 8 oz (47.4 kg) IBW/kg (Calculated) : 77.6    Vital Signs: Temp: 103.3 F (39.6 C) (07/23 1400) Temp src: Core (Comment) (07/23 0900) BP: 133/85 mmHg (07/23 1400) Pulse Rate: 103  (07/23 1400)  Labs:  Basename 12/27/11 1000 12/27/11 0405 12/26/11 0303 12/25/11 1812 12/25/11 0840 12/24/11 1814  HGB -- 13.6 13.1 -- -- --  HCT -- 40.5 39.0 -- 41.8 --  PLT -- 156 160 -- 186 --  APTT -- -- -- -- -- --  LABPROT -- -- 15.4* -- -- 13.6  INR -- -- 1.19 -- -- 1.02  HEPARINUNFRC -- 0.79* 0.44 0.65 -- --  CREATININE 0.80 -- 0.81 0.77 -- --  CKTOTAL -- -- 522* -- 943* 923*  CKMB -- -- -- -- 5.3* 19.9*  TROPONINI -- -- -- -- 5.46* 3.90*    Estimated Creatinine Clearance: 64.2 ml/min (by C-G formula based on Cr of 0.8).   Medications:  Infusions:     . sodium chloride 50 mL/hr at 12/25/11 1035  . heparin 800 Units/hr (12/26/11 2200)  . propofol Stopped (12/27/11 1300)  . DISCONTD: heparin      Assessment: NSTEMI vs. Demand ischemia, Atrial Fibrillation - Heparin level was elevated this am but drawn from same arm heparin infusing in.  Heparin has been on hold today for laryngoscopy and possible extubation.  Will restart heparin now at previous rate and check level in 8 hours.  Goal of Therapy:  Heparin level 0.3-0.7 units/ml Monitor platelets by anticoagulation protocol: Yes   Plan:  Resume heparin drip at 850 units/hr Check heparin level 8 hours after restart.  Talbert Cage, Pharm.D. Clinical Pharmacist  Phone [phone] Pager [phone] 12/27/2011, 3:37 PM

## 2011-12-27 NOTE — Progress Notes (Addendum)
PCCM Interval Note  Evaluated patient this pm. He went to OR today, airway exam was reassuring. Came back to ICU orally intubated.   Looks good on SBT now, good MS.   Will plan to extubate now, follow closely.   Levy Pupa, MD, PhD 12/27/2011, 5:16 PM [facility] [phone] or if no answer [phone]   Addendum:   Assessed post-extubation. No secretions currently, stable on 4L/min, but clearly weak with worse airway protection than I would have predicted. Will continue to push him to vocalize, follow commands, cough. He is marginal, may require re-intubation if he remains this lethargic.   Levy Pupa, MD, PhD 12/27/2011, 5:25 PM [facility] [phone] or if no answer [phone]

## 2011-12-27 NOTE — Progress Notes (Signed)
Received patient back from the OR, orally intubated, will attempt to wean and extubate per order.
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
## 2011-12-27 NOTE — Transfer of Care (Signed)
Immediate Anesthesia Transfer of Care Note  Patient: [name]  Procedure(s) Performed: Procedure(s) (LRB): LARYNGOSCOPY (N/A)  Patient Location: SICU  Anesthesia Type: General  Level of Consciousness: Patient remains intubated per anesthesia plan  Airway & Oxygen Therapy: Patient remains intubated per anesthesia plan and Patient placed on Ventilator (see vital sign flow sheet for setting)  Post-op Assessment: Report given to PACU RN and Post -op Vital signs reviewed and stable  Post vital signs: Reviewed and stable  Complications: No apparent anesthesia complications

## 2011-12-27 NOTE — Anesthesia Postprocedure Evaluation (Signed)
  Anesthesia Post-op Note  Patient: [name]  Procedure(s) Performed: Procedure(s) (LRB): LARYNGOSCOPY (N/A)  Patient Location: ICU  Anesthesia Type: General  Level of Consciousness: sedated and Patient remains intubated per anesthesia plan  Airway and Oxygen Therapy: Patients remains on ventilator  Post-op Pain: none  Post-op Assessment: Post-op Vital signs reviewed and Patient's Cardiovascular Status Stable  Post-op Vital Signs: stable  Complications: No apparent anesthesia complications
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
## 2011-12-27 NOTE — Procedures (Signed)
Extubation Procedure Note  Patient Details:   Name: [name] DOB: 12-Jul-1950 MRN: [id number]   Airway Documentation:  AIRWAYS 6 mm (Active)     Airway 7.5 mm (Active)  Secured at (cm) 22 cm 12/27/2011  4:16 PM  Measured From Lips 12/27/2011  4:16 PM  Secured Location Right 12/27/2011  4:16 PM  Secured By Caron Presume Tape 12/27/2011  4:16 PM    Evaluation  O2 sats: stable throughout Complications: No apparent complications Patient did tolerate procedure well. Bilateral Breath Sounds: Rhonchi Suctioning: Airway Yes  [name], [name] 12/27/2011, 5:30 PM

## 2011-12-28 ENCOUNTER — Inpatient Hospital Stay (HOSPITAL_COMMUNITY): Payer: PRIVATE HEALTH INSURANCE

## 2011-12-28 ENCOUNTER — Encounter (HOSPITAL_COMMUNITY): Payer: Self-pay | Admitting: Otolaryngology

## 2011-12-28 DIAGNOSIS — I10 Essential (primary) hypertension: Secondary | ICD-10-CM

## 2011-12-28 DIAGNOSIS — I214 Non-ST elevation (NSTEMI) myocardial infarction: Secondary | ICD-10-CM | POA: Diagnosis present

## 2011-12-28 LAB — HEPARIN LEVEL (UNFRACTIONATED)
Heparin Unfractionated: 0.57 IU/mL (ref 0.30–0.70)
Heparin Unfractionated: 0.51 IU/mL (ref 0.30–0.70)

## 2011-12-28 LAB — POCT I-STAT 3, ART BLOOD GAS (G3+)
Acid-base deficit: 4 mmol/L — ABNORMAL HIGH (ref 0.0–2.0)
Bicarbonate: 19.4 mEq/L — ABNORMAL LOW (ref 20.0–24.0)
O2 Saturation: 98 %

## 2011-12-28 LAB — PROCALCITONIN: Procalcitonin: 0.91 ng/mL

## 2011-12-28 LAB — BASIC METABOLIC PANEL
Calcium: 8.8 mg/dL (ref 8.4–10.5)
GFR calc Af Amer: >90 mL/min (ref >90)
GFR calc non Af Amer: >90 mL/min (ref >90)
Potassium: 3.2 mEq/L — ABNORMAL LOW (ref 3.5–5.1)
Sodium: 146 mEq/L — ABNORMAL HIGH (ref 135–145)

## 2011-12-28 LAB — CBC
MCH: 29.7 pg (ref 26.0–34.0)
Platelets: 158 10*3/uL (ref 150–400)
RBC: 4.17 MIL/uL — ABNORMAL LOW (ref 4.22–5.81)
WBC: 13.3 10*3/uL — ABNORMAL HIGH (ref 4.0–10.5)

## 2011-12-28 LAB — GLUCOSE, CAPILLARY: Glucose-Capillary: 124 mg/dL — ABNORMAL HIGH (ref 70–99)

## 2011-12-28 MED ORDER — INSULIN ASPART 100 UNIT/ML ~~LOC~~ SOLN
0.0000 [IU] | SUBCUTANEOUS | Status: DC
  Administered 2011-12-28: 3 [IU] via SUBCUTANEOUS
  Administered 2011-12-28 – 2011-12-29 (×2): 2 [IU] via SUBCUTANEOUS

## 2011-12-28 MED ORDER — ATORVASTATIN CALCIUM 40 MG PO TABS
40.0000 mg | ORAL_TABLET | Freq: Every day | ORAL | Status: DC
  Administered 2011-12-28 – 2012-01-01 (×5): 40 mg via ORAL
  Filled 2011-12-28 (×6): qty 1

## 2011-12-28 MED ORDER — LEVOFLOXACIN IN D5W 750 MG/150ML IV SOLN
750.0000 mg | INTRAVENOUS | Status: DC
  Administered 2011-12-28 – 2011-12-30 (×3): 750 mg via INTRAVENOUS
  Filled 2011-12-28 (×3): qty 150

## 2011-12-28 MED ORDER — LISINOPRIL 20 MG PO TABS
20.0000 mg | ORAL_TABLET | Freq: Every day | ORAL | Status: DC
  Administered 2011-12-28 – 2012-01-02 (×6): 20 mg via ORAL
  Filled 2011-12-28 (×6): qty 1

## 2011-12-28 MED ORDER — POTASSIUM CHLORIDE 10 MEQ/100ML IV SOLN
10.0000 meq | INTRAVENOUS | Status: AC
  Administered 2011-12-28 (×4): 10 meq via INTRAVENOUS
  Filled 2011-12-28: qty 400

## 2011-12-28 MED ORDER — SODIUM CHLORIDE 0.45 % IV SOLN
INTRAVENOUS | Status: DC
  Administered 2011-12-28 – 2011-12-30 (×3): via INTRAVENOUS
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
## 2011-12-28 NOTE — Progress Notes (Signed)
ANTICOAGULATION CONSULT NOTE - Follow Up Consult  Pharmacy Consult for Heparin Indication: chest pain/ACS  No Known Allergies  Patient Measurements: Height: 6' (182.9 cm) Weight: 104 lb 8 oz (47.4 kg) IBW/kg (Calculated) : 77.6    Vital Signs: Temp: 98.4 F (36.9 C) (07/24 0000) Temp src: Oral (07/23 1949) BP: 139/86 mmHg (07/24 0000) Pulse Rate: 54  (07/24 0000)  Labs:  Basename 12/28/11 0003 12/27/11 1000 12/27/11 0405 12/26/11 0303 12/25/11 1812 12/25/11 0840  HGB -- -- 13.6 13.1 -- --  HCT -- -- 40.5 39.0 -- 41.8  PLT -- -- 156 160 -- 186  APTT -- -- -- -- -- --  LABPROT -- -- -- 15.4* -- --  INR -- -- -- 1.19 -- --  HEPARINUNFRC 0.17* -- 0.79* 0.44 -- --  CREATININE -- 0.80 -- 0.81 0.77 --  CKTOTAL -- -- -- 522* -- 943*  CKMB -- -- -- -- -- 5.3*  TROPONINI -- -- -- -- -- 5.46*    Estimated Creatinine Clearance: 64.2 ml/min (by C-G formula based on Cr of 0.8).   Medications:  Infusions:     . sodium chloride 50 mL/hr at 12/25/11 1035  . heparin 800 Units/hr (12/26/11 2200)  . heparin 850 Units/hr (12/27/11 2151)  . propofol Stopped (12/27/11 1300)    Assessment: 61 yo male with elevated cardiac enzymes (felt to be due to demand ischemia) and atrial fibrillation on IV heparin. Heparin level (0.17) is below-goal on 850 units/hr. No problem with line/infusion per RN.   Goal of Therapy:  Heparin level 0.3-0.7 units/ml Monitor platelets by anticoagulation protocol: Yes   Plan:  1. Increase IV heparin to 1000 units/hr.  2. Heparin level in 6 hours.   Lorre Munroe, PharmD BCPS 12/28/2011, 12:48 AM

## 2011-12-28 NOTE — Progress Notes (Signed)
ANTICOAGULATION CONSULT NOTE - Follow Up Consult  Pharmacy Consult for Heparin Indication: chest pain/ACS  No Known Allergies  Patient Measurements: Height: 6' (182.9 cm) Weight: 108 lb 8 oz (49.215 kg) IBW/kg (Calculated) : 77.6    Vital Signs: Temp: 101.1 F (38.4 C) (07/24 0800) Temp src: Other (Comment) (07/24 0800) BP: 160/62 mmHg (07/24 0700) Pulse Rate: 39  (07/24 0700)  Labs:  Basename 12/28/11 0730 12/28/11 0003 12/27/11 1000 12/27/11 0405 12/26/11 0303 12/25/11 1812 12/25/11 0840  HGB 12.4* -- -- 13.6 -- -- --  HCT 37.0* -- -- 40.5 39.0 -- --  PLT 158 -- -- 156 160 -- --  APTT -- -- -- -- -- -- --  LABPROT -- -- -- -- 15.4* -- --  INR -- -- -- -- 1.19 -- --  HEPARINUNFRC 0.57 0.17* -- 0.79* -- -- --  CREATININE -- -- 0.80 -- 0.81 0.77 --  CKTOTAL -- -- -- -- 522* -- 943*  CKMB -- -- -- -- -- -- 5.3*  TROPONINI -- -- -- -- -- -- 5.46*    Estimated Creatinine Clearance: 66.6 ml/min (by C-G formula based on Cr of 0.8).   Medications:  Infusions:     . sodium chloride 50 mL/hr at 12/25/11 1035  . heparin 1,000 Units/hr (12/28/11 0056)  . propofol Stopped (12/27/11 1300)    Assessment: 61 yo male with elevated cardiac enzymes (felt to be due to demand ischemia) and atrial fibrillation on IV heparin. Heparin level is at goal on 1000 units/hr.   Goal of Therapy:  Heparin level 0.3-0.7 units/ml Monitor platelets by anticoagulation protocol: Yes   Plan:  1. Cont IV heparin at 1000 units/hr.  2. F/u Heparin level in 8 hours.   Talbert Cage, PharmD 12/28/2011, 8:33 AM

## 2011-12-28 NOTE — Progress Notes (Signed)
ANTICOAGULATION CONSULT NOTE - Follow Up Consult  Pharmacy Consult for Heparin Indication: chest pain/ACS  No Known Allergies  Patient Measurements: Height: 6' (182.9 cm) Weight: 108 lb 8 oz (49.215 kg) IBW/kg (Calculated) : 77.6    Vital Signs: Temp: 98.8 F (37.1 C) (07/24 1600) Temp src: Axillary (07/24 1149) BP: 137/65 mmHg (07/24 1600) Pulse Rate: 60  (07/24 1600)  Labs:  Basename 12/28/11 1609 12/28/11 0730 12/28/11 0003 12/27/11 1000 12/27/11 0405 12/26/11 0303  HGB -- 12.4* -- -- 13.6 --  HCT -- 37.0* -- -- 40.5 39.0  PLT -- 158 -- -- 156 160  APTT -- -- -- -- -- --  LABPROT -- -- -- -- -- 15.4*  INR -- -- -- -- -- 1.19  HEPARINUNFRC 0.51 0.57 0.17* -- -- --  CREATININE -- 0.81 -- 0.80 -- 0.81  CKTOTAL -- -- -- -- -- 522*  CKMB -- -- -- -- -- --  TROPONINI -- -- -- -- -- --    Estimated Creatinine Clearance: 65.8 ml/min (by C-G formula based on Cr of 0.81).   Medications:  Infusions:     . sodium chloride 75 mL/hr at 12/28/11 1111  . heparin 1,000 Units/hr (12/28/11 0056)  . DISCONTD: sodium chloride 50 mL/hr at 12/25/11 1035  . DISCONTD: propofol Stopped (12/27/11 1300)    Assessment: 61 yo male with elevated cardiac enzymes (felt to be due to demand ischemia) and atrial fibrillation on IV heparin. Repeat heparin level 0.51 remains in goal.  Goal of Therapy:  Heparin level 0.3-0.7 units/ml Monitor platelets by anticoagulation protocol: Yes   Plan:  1. Cont IV heparin at 1000 units/hr.  2. AM heparin level and CBC    Kyren Vaux S. Merilynn Finland, PharmD, BCPS Clinical Staff Pharmacist Pager [phone]  12/28/2011, 5:30 PM
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
## 2011-12-28 NOTE — Progress Notes (Signed)
Nutrition Follow-up  Intervention:    Await swallow evaluation results/SLP recommendations  If EN warranted, recommend Jevity 1.2 formula at 20 ml/hr and increase by 10 ml every 8 hours to goal rate of 60 ml/hr to provide 1728 kcals, 80 gm protein, 1162 ml of free water RD to follow for nutrition care plan  Assessment:   Patient s/p micro direct laryngoscopy with extubation and re-intubation 7/23 AM. Extubated 7/23 PM. Propofol discontinued. + bowel sounds. Bedside swallow evaluation pending. If EN started, will need to advance slowly, monitoring Mg, Phos, K+ levels given refeeding risk with underweight/critically ill state.  Diet Order:  NPO  Meds: Scheduled Meds:   . aspirin  300 mg Rectal Daily  . atorvastatin  40 mg Oral q1800  . chlorhexidine  15 mL Mouth/Throat BID  . digoxin  0.125 mg Oral Daily  . insulin aspart  0-15 Units Subcutaneous Q4H  . insulin glargine  10 Units Subcutaneous Q24H  . levofloxacin (LEVAQUIN) IV  750 mg Intravenous Q24H  . lisinopril  20 mg Oral Daily  . potassium chloride  10 mEq Intravenous Q1 Hr x 4  . DISCONTD: insulin aspart  0-3 Units Subcutaneous Q4H  . DISCONTD: insulin glargine  75 Units Subcutaneous BID  . DISCONTD: lisinopril  40 mg Oral Daily  . DISCONTD: metoprolol  5 mg Intravenous Q6H  . DISCONTD: metoprolol tartrate  25 mg Oral BID  . DISCONTD: pantoprazole (PROTONIX) IV  40 mg Intravenous QHS  . DISCONTD: piperacillin-tazobactam (ZOSYN)  IV  3.375 g Intravenous Q8H   Continuous Infusions:   . sodium chloride    . heparin 1,000 Units/hr (12/28/11 0056)  . DISCONTD: sodium chloride 50 mL/hr at 12/25/11 1035  . DISCONTD: propofol Stopped (12/27/11 1300)   PRN Meds:.sodium chloride, acetaminophen, antiseptic oral rinse, hydrALAZINE, DISCONTD: fentaNYL, DISCONTD: ipratropium  Labs:  CMP     Component Value Date/Time   NA 146* 12/28/2011 0730   K 3.2* 12/28/2011 0730   CL 109 12/28/2011 0730   CO2 21 12/28/2011 0730   GLUCOSE 112*  12/28/2011 0730   BUN 9 12/28/2011 0730   CREATININE 0.81 12/28/2011 0730   CALCIUM 8.8 12/28/2011 0730   PROT 8.2 12/24/2011 1252   ALBUMIN 3.8 12/24/2011 1252   AST 53* 12/24/2011 1252   ALT 35 12/24/2011 1252   ALKPHOS 131* 12/24/2011 1252   BILITOT 0.6 12/24/2011 1252   GFRNONAA >90 12/28/2011 0730   GFRAA >90 12/28/2011 0730    Phosphorus  Date Value Range Status  12/25/2011 1.1* 2.3 - 4.6 mg/dL Final    Magnesium  Date Value Range Status  12/25/2011 1.8  1.5 - 2.5 mg/dL Final     Intake/Output Summary (Last 24 hours) at 12/28/11 1055 Last data filed at 12/28/11 0900  Gross per 24 hour  Intake   1226 ml  Output    875 ml  Net    351 ml    CBG (last 3)   Basename 12/28/11 0759 12/28/11 0353 12/28/11 0016  GLUCAP 116* 121* 124*    Weight Status:  49.2 kg (7/24) -- stable  Re-estimated needs:  1600-1800 kcals, 80-90 gm protein  Nutrition Dx:  Inadequate Oral Intake now r/t inability to eat, s/p extubation as evidenced by NPO status, ongoing  Goal:  EN vs PO diet to meet >/= 90% of estimated nutrition needs, currently unmet  Monitor:  EN initiation, PO diet advancement & intake, weight, labs, I/O's  Kirkland Hun, RD, LDN Pager #: [phone] After-Hours Pager #:  [phone]    

## 2011-12-28 NOTE — Plan of Care (Signed)
Problem: Phase I Progression Outcomes Goal: Voiding-avoid urinary catheter unless indicated Outcome: Not Met (add Reason) Pt has Foley  Problem: Phase II Progression Outcomes Goal: Date pt extubated/weaned off vent Outcome: Completed/Met Date Met:  12/27/11 12/27/11 Goal: Time pt extubated/weaned off vent Outcome: Completed/Met Date Met:  12/27/11 1800

## 2011-12-29 DIAGNOSIS — I214 Non-ST elevation (NSTEMI) myocardial infarction: Secondary | ICD-10-CM

## 2011-12-29 LAB — GLUCOSE, CAPILLARY
Glucose-Capillary: 102 mg/dL — ABNORMAL HIGH (ref 70–99)
Glucose-Capillary: 74 mg/dL (ref 70–99)

## 2011-12-29 LAB — BASIC METABOLIC PANEL
CO2: 22 mEq/L (ref 19–32)
Calcium: 9 mg/dL (ref 8.4–10.5)
Chloride: 105 mEq/L (ref 96–112)
Glucose, Bld: 106 mg/dL — ABNORMAL HIGH (ref 70–99)
Sodium: 140 mEq/L (ref 135–145)

## 2011-12-29 LAB — CBC
Hemoglobin: 11.4 g/dL — ABNORMAL LOW (ref 13.0–17.0)
MCH: 29.8 pg (ref 26.0–34.0)
MCHC: 34 g/dL (ref 30.0–36.0)
Platelets: 143 10*3/uL — ABNORMAL LOW (ref 150–400)

## 2011-12-29 LAB — HEPARIN LEVEL (UNFRACTIONATED): Heparin Unfractionated: 0.23 IU/mL — ABNORMAL LOW (ref 0.30–0.70)

## 2011-12-29 MED ORDER — POTASSIUM CHLORIDE 20 MEQ/15ML (10%) PO LIQD
40.0000 meq | Freq: Two times a day (BID) | ORAL | Status: AC
  Administered 2011-12-30: 40 meq via ORAL
  Filled 2011-12-29 (×3): qty 30

## 2011-12-29 MED ORDER — HEPARIN SODIUM (PORCINE) 5000 UNIT/ML IJ SOLN
5000.0000 [IU] | Freq: Three times a day (TID) | INTRAMUSCULAR | Status: DC
  Administered 2011-12-29 – 2012-01-02 (×12): 5000 [IU] via SUBCUTANEOUS
  Filled 2011-12-29 (×15): qty 1

## 2011-12-29 MED ORDER — ACETAMINOPHEN 325 MG PO TABS: 650.0000 mg | ORAL_TABLET | Freq: Four times a day (QID) | ORAL | Status: DC | PRN

## 2011-12-29 MED ORDER — POTASSIUM CHLORIDE CRYS ER 20 MEQ PO TBCR
EXTENDED_RELEASE_TABLET | ORAL | Status: AC
  Administered 2011-12-29: 40 meq
  Filled 2011-12-29: qty 2

## 2011-12-29 MED ORDER — ASPIRIN 325 MG PO TABS
325.0000 mg | ORAL_TABLET | Freq: Every day | ORAL | Status: DC
  Administered 2011-12-29 – 2011-12-30 (×2): 325 mg via ORAL
  Filled 2011-12-29 (×2): qty 1

## 2011-12-29 MED ORDER — METOPROLOL TARTRATE 12.5 MG HALF TABLET
12.5000 mg | ORAL_TABLET | Freq: Two times a day (BID) | ORAL | Status: DC
  Administered 2011-12-29 – 2012-01-02 (×8): 12.5 mg via ORAL
  Filled 2011-12-29 (×9): qty 1

## 2011-12-29 MED ORDER — INSULIN ASPART 100 UNIT/ML ~~LOC~~ SOLN: 0.0000 [IU] | Freq: Three times a day (TID) | SUBCUTANEOUS | Status: DC

## 2011-12-29 MED ORDER — RESOURCE THICKENUP CLEAR PO POWD
ORAL | Status: DC | PRN
  Filled 2011-12-29: qty 125

## 2011-12-29 MED ORDER — ENSURE PUDDING PO PUDG: 1.0000 | Freq: Every day | ORAL | Status: DC | PRN

## 2011-12-29 MED ORDER — INSULIN ASPART 100 UNIT/ML ~~LOC~~ SOLN
0.0000 [IU] | Freq: Every day | SUBCUTANEOUS | Status: DC
  Administered 2011-12-30: 2 [IU] via SUBCUTANEOUS

## 2011-12-29 MED ORDER — INSULIN ASPART 100 UNIT/ML ~~LOC~~ SOLN
0.0000 [IU] | Freq: Three times a day (TID) | SUBCUTANEOUS | Status: DC
  Administered 2011-12-29 – 2011-12-30 (×2): 3 [IU] via SUBCUTANEOUS
  Administered 2011-12-30 – 2011-12-31 (×2): 2 [IU] via SUBCUTANEOUS
  Administered 2011-12-31: 5 [IU] via SUBCUTANEOUS
  Administered 2012-01-01 – 2012-01-02 (×3): 2 [IU] via SUBCUTANEOUS
  Administered 2012-01-02: 3 [IU] via SUBCUTANEOUS

## 2011-12-29 NOTE — Progress Notes (Signed)
ANTICOAGULATION CONSULT NOTE - Follow Up Consult  Pharmacy Consult for heparin Indication: chest pain/ACS  Labs:  Basename 12/29/11 0426 12/28/11 1609 12/28/11 0730 12/27/11 1000 12/27/11 0405  HGB 11.4* -- 12.4* -- --  HCT 33.5* -- 37.0* -- 40.5  PLT 143* -- 158 -- 156  APTT -- -- -- -- --  LABPROT -- -- -- -- --  INR -- -- -- -- --  HEPARINUNFRC 0.23* 0.51 0.57 -- --  CREATININE -- -- 0.81 0.80 --  CKTOTAL -- -- -- -- --  CKMB -- -- -- -- --  TROPONINI -- -- -- -- --    Assessment: 61yo male now subtherapeutic on heparin after two levels at goal yesterday; no infusion issues per RN.  Goal of Therapy:  Heparin level 0.3-0.7 units/ml   Plan:  Will increase heparin gtt to 1100 units/hr and check level in 6hr.  Colleen Can PharmD BCPS 12/29/2011,4:58 AM

## 2011-12-29 NOTE — Progress Notes (Signed)
PT Cancellation Note  Treatment cancelled today due to pt's sister (POA) deferred eval today.  Pt's sister reports she or her husband perform transfers with sliding board with total assist.  Sister reports pt can feed himself and can maintain sitting balance on EOB.  Pt's sister feels pt is probably close to baseline and they can continue to provide care for pt.  Will follow up tomorrow to assess sitting balance and bed mobility.  [name] 12/29/2011, 2:04 PM  [facility] PT [phone]
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
## 2011-12-29 NOTE — Progress Notes (Signed)
OT Cancellation Note  Discussed with PT about prior level of function.  Per their interview with the pt's daughter the pt needed max to total assist for all selfcare tasks including bathing and dressing.  He was able to self feed himself but other wise was dependent for all toileting, and sliding baord transfers.  Will sign off for OT services as pt is basically at functional baseline for ADLs.   Tiffanni Scarfo OTR/L Pager number [phone] 12/29/2011, 2:39 PM

## 2011-12-29 NOTE — Progress Notes (Addendum)
Nutrition Follow-up  Intervention:    Ensure Pudding daily PRN (170 kcals, 4 gm protein per 4 oz cup)  Please provide feeding assistance with meals RD to follow for nutrition care plan  Assessment:   S/p bedside swallow evaluation 7/24. Patient at aspiration risk given intubation period overlying baseline dysphagia. Per 2300 RN, patient consumed 100% of breakfast, however required full assistance. Would benefit from supplementation as needed given underweight state -- RD to order.  Diet Order:  Dysphagia 1, nectar thick liquids  Meds: Scheduled Meds:   . aspirin  325 mg Oral Daily  . atorvastatin  40 mg Oral q1800  . digoxin  0.125 mg Oral Daily  . heparin subcutaneous  5,000 Units Subcutaneous Q8H  . insulin aspart  0-15 Units Subcutaneous TID WC  . insulin aspart  0-5 Units Subcutaneous QHS  . insulin glargine  10 Units Subcutaneous Q24H  . levofloxacin (LEVAQUIN) IV  750 mg Intravenous Q24H  . lisinopril  20 mg Oral Daily  . potassium chloride  10 mEq Intravenous Q1 Hr x 4  . potassium chloride  40 mEq Oral BID  . DISCONTD: aspirin  300 mg Rectal Daily  . DISCONTD: chlorhexidine  15 mL Mouth/Throat BID  . DISCONTD: insulin aspart  0-15 Units Subcutaneous Q4H   Continuous Infusions:   . sodium chloride 75 mL (12/29/11 0800)  . DISCONTD: heparin Stopped (12/29/11 0800)   PRN Meds:.sodium chloride, acetaminophen, hydrALAZINE, DISCONTD: acetaminophen, DISCONTD: antiseptic oral rinse  Labs:  CMP     Component Value Date/Time   NA 140 12/29/2011 0836   K 2.8* 12/29/2011 0836   CL 105 12/29/2011 0836   CO2 22 12/29/2011 0836   GLUCOSE 106* 12/29/2011 0836   BUN 8 12/29/2011 0836   CREATININE 0.73 12/29/2011 0836   CALCIUM 9.0 12/29/2011 0836   PROT 8.2 12/24/2011 1252   ALBUMIN 3.8 12/24/2011 1252   AST 53* 12/24/2011 1252   ALT 35 12/24/2011 1252   ALKPHOS 131* 12/24/2011 1252   BILITOT 0.6 12/24/2011 1252   GFRNONAA >90 12/29/2011 0836   GFRAA >90 12/29/2011 0836      Intake/Output Summary (Last 24 hours) at 12/29/11 1234 Last data filed at 12/29/11 1000  Gross per 24 hour  Intake 2267.91 ml  Output   1040 ml  Net 1227.91 ml    CBG (last 3)   Basename 12/29/11 1143 12/29/11 0804 12/29/11 0327  GLUCAP 181* 102* 137*    Body mass index is 14.78 kg/(m^2).  Weight Status:  49.4 kg (7/25) -- stable  Re-estimated needs: 1600-1800 kcals, 80-90 gm protein   New Nutrition Dx: Swallowing difficulty r/t CVA as evidenced by clinical swallow evaluation, ongoing  New Goal: Oral intake with meals & supplements to meet >/= 90% of estimated nutrition needs, progressing  Monitor: PO & supplemental intake, weight, labs, I/O's  Kirkland Hun, RD, LDN Pager #: [phone] After-Hours Pager #: [phone]

## 2011-12-29 NOTE — Progress Notes (Signed)
    Subjective:  No chest pain or dyspnea. Very difficult to obtain any meaningful history.  Objective:  Vital Signs in the last 24 hours: Temp:  [99.3 F (37.4 C)-102.4 F (39.1 C)] 100.1 F (37.8 C) (07/25 1400) Pulse Rate:  [59-89] 64  (07/25 1400) Resp:  [14-32] 18  (07/25 1400) BP: (130-175)/(55-102) 144/68 mmHg (07/25 1400) SpO2:  [85 %-100 %] 100 % (07/25 1400) Weight:  [49.442 kg (109 lb)] 49.442 kg (109 lb) (07/25 0500)  Intake/Output from previous day: 07/24 0701 - 07/25 0700 In: 2649.4 [P.O.:175; I.V.:1886.9; IV Piggyback:587.5] Out: 960 [Urine:960]  Physical Exam: Pt is alert, NAD HEENT: normal, chronic expressive aphasia Neck: JVP - normal Lungs: diffuse rhonchi CV: irregular without murmur or gallop Abd: soft, NT, Positive BS, no hepatomegaly Ext: b/l amputations Skin: warm/dry no rash  Lab Results:  Basename 12/29/11 0426 12/28/11 0730  WBC 11.1* 13.3*  HGB 11.4* 12.4*  PLT 143* 158    Basename 12/29/11 0836 12/28/11 0730  NA 140 146*  K 2.8* 3.2*  CL 105 109  CO2 22 21  GLUCOSE 106* 112*  BUN 8 9  CREATININE 0.73 0.81   No results found for this basename: TROPONINI:2,CK,MB:2 in the last 72 hours  Tele: atrial fibrillation, rate-controlled  Assessment/Plan:  1. Atrial fibrillation 2. NSTEMI, suspect demand ischemia 3. Cardiomyopathy, uncertain etiology  Continue conservative therapy with ASA, ACE, and statin. Would stop digoxin and start a beta-blocker so that he receives the secondary benefit of beta-blockade for his cardiomyopathy. He is not a candidate for chronic anticoagulation.  Tonny Bollman, M.D. 12/29/2011, 6:23 PM

## 2011-12-30 ENCOUNTER — Inpatient Hospital Stay (HOSPITAL_COMMUNITY): Payer: PRIVATE HEALTH INSURANCE

## 2011-12-30 DIAGNOSIS — T5891XA Toxic effect of carbon monoxide from unspecified source, accidental (unintentional), initial encounter: Secondary | ICD-10-CM

## 2011-12-30 DIAGNOSIS — I5020 Unspecified systolic (congestive) heart failure: Secondary | ICD-10-CM | POA: Diagnosis present

## 2011-12-30 DIAGNOSIS — T588X4A Toxic effect of carbon monoxide from other source, undetermined, initial encounter: Secondary | ICD-10-CM

## 2011-12-30 DIAGNOSIS — G9340 Encephalopathy, unspecified: Secondary | ICD-10-CM | POA: Diagnosis present

## 2011-12-30 DIAGNOSIS — E119 Type 2 diabetes mellitus without complications: Secondary | ICD-10-CM

## 2011-12-30 DIAGNOSIS — I4891 Unspecified atrial fibrillation: Secondary | ICD-10-CM | POA: Diagnosis present

## 2011-12-30 DIAGNOSIS — T5894XA Toxic effect of carbon monoxide from unspecified source, undetermined, initial encounter: Secondary | ICD-10-CM

## 2011-12-30 LAB — CBC
HCT: 36.8 % — ABNORMAL LOW (ref 39.0–52.0)
MCHC: 34.2 g/dL (ref 30.0–36.0)
MCV: 86.6 fL (ref 78.0–100.0)
Platelets: 182 10*3/uL (ref 150–400)
RDW: 14.1 % (ref 11.5–15.5)
WBC: 9 10*3/uL (ref 4.0–10.5)

## 2011-12-30 LAB — BASIC METABOLIC PANEL
BUN: 7 mg/dL (ref 6–23)
CO2: 22 mEq/L (ref 19–32)
Calcium: 8.5 mg/dL (ref 8.4–10.5)
Chloride: 102 mEq/L (ref 96–112)
Creatinine, Ser: 0.63 mg/dL (ref 0.50–1.35)
GFR calc Af Amer: >90 mL/min (ref >90)

## 2011-12-30 LAB — GLUCOSE, CAPILLARY
Glucose-Capillary: 156 mg/dL — ABNORMAL HIGH (ref 70–99)
Glucose-Capillary: 206 mg/dL — ABNORMAL HIGH (ref 70–99)

## 2011-12-30 MED ORDER — STARCH (THICKENING) PO POWD
ORAL | Status: DC | PRN
  Filled 2011-12-30: qty 227

## 2011-12-30 MED ORDER — ASPIRIN EC 81 MG PO TBEC
81.0000 mg | DELAYED_RELEASE_TABLET | Freq: Every day | ORAL | Status: DC
  Administered 2011-12-31 – 2012-01-02 (×3): 81 mg via ORAL
  Filled 2011-12-30 (×3): qty 1

## 2011-12-30 MED ORDER — LEVOFLOXACIN 750 MG PO TABS
750.0000 mg | ORAL_TABLET | Freq: Every day | ORAL | Status: DC
  Administered 2011-12-31 – 2012-01-01 (×2): 750 mg via ORAL
  Filled 2011-12-30 (×2): qty 1

## 2011-12-30 NOTE — Progress Notes (Signed)
Occupational Therapy Discharge Patient Details Name: [name] MRN: [id number] DOB: [birth year] Today's Date: 12/30/2011 Time:  -     Patient discharged from OT services secondary to Discussed with PT [facility]) about prior level of function.  Per their interview with the pt's daughter the pt needed max to total assist for all selfcare tasks including bathing and dressing.  He was able to self feed himself but other wise was dependent for all toileting, and sliding baord transfers.  Will sign off for OT services as pt is basically at functional baseline for ADLs..  Please see latest therapy progress note for current level of functioning and progress toward goals.          Harrel Carina [place] Pager: [phone]  12/30/2011, 8:33 AM
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
## 2011-12-30 NOTE — Progress Notes (Signed)
Speech Language Pathology Dysphagia Treatment Patient Details Name: [name] MRN: [id number] DOB: 1951/03/27 Today's Date: 12/30/2011 Time: 1100-1130 SLP Time Calculation (min): 30 min  Assessment / Plan / Recommendation Clinical Impression   Patient assessed by speech therapy with thin liquids and mixed consistencies (Dys 2/3) to determine readiness for upgrade beyond current restrictions (puree solids, nectar thick liquids). Patient exhibited prolonged mastication with Dysphagia 2/3 consistency (fruit cup), but tolerated thin liquids via cup sips with no overt s/s aspiration. Patient did exhibit suspected swallow initiation delay. Straw sips resulted in audible swallow and mild belching; suspect patient was also swallowing air.     Diet Recommendation  Initiate / Change Diet: Dysphagia 1 (puree);Thin liquid    SLP Plan     Pertinent Vitals/Pain PAINAD=0, no changes in vital signs, or visible s/s distress.   Swallowing Goals   Patient will tolerate current diet with no overt s/s aspiration, with full supervision and assistance with feeding.  General Temperature Spikes Noted: No Respiratory Status: Other (comment) (nasal cannula) Behavior/Cognition: Alert;Requires cueing;Other (comment) (aphasic/apraxic) Oral Cavity - Dentition: Missing dentition Patient Positioning: Upright in bed  Oral Cavity - Oral Hygiene   Oral care completed by speech therapist after PO trials; applied biotene dry mouth gel in oral cavity with toothette sponge.   Dysphagia Treatment Treatment focused on: Upgraded PO texture trials Treatment Methods/Modalities: Skilled observation Patient observed directly with PO's: Yes Type of PO's observed: Dysphagia 2 (chopped);Thin liquids Feeding: Total assist Liquids provided via: Cup;Straw Oral Phase Signs & Symptoms: Prolonged mastication Pharyngeal Phase Signs & Symptoms: Suspected delayed swallow initiation Type of cueing: Verbal;Tactile;Visual Amount  of cueing: Maximal   GO     [name] 12/30/2011, 11:39 AM  Angela Nevin, MA, CCC-SLP [facility] Speech-Language Pathologist

## 2011-12-31 DIAGNOSIS — I4891 Unspecified atrial fibrillation: Secondary | ICD-10-CM

## 2011-12-31 LAB — CULTURE, BLOOD (ROUTINE X 2): Culture: NO GROWTH

## 2011-12-31 LAB — GLUCOSE, CAPILLARY
Glucose-Capillary: 152 mg/dL — ABNORMAL HIGH (ref 70–99)
Glucose-Capillary: 206 mg/dL — ABNORMAL HIGH (ref 70–99)

## 2011-12-31 NOTE — Progress Notes (Signed)
Speech Language Pathology Dysphagia Treatment Patient Details Name: [name] MRN: [id number] DOB: 1950/07/04 Today's Date: 12/31/2011 Time: 1230-1300 SLP Time Calculation (min): 30 min  Assessment / Plan / Recommendation Clinical Impression  Purpose of diagnostic treatment for diet tolerance of dysphagia 1 (puree) and thin liquids and possible diet advancement. Patient alert and  communicates mainly via yes/no responses which are not consistently accurate.    Patient administered thin water by cup s/p oral care with min verbal cues to take small sips. No observed outward s/s of aspiration noted.  Unable to upgrade diet at this time as patient refused PO trials of mechanical soft.  CNA reports patient with difficulty with certain puree foods (meat and mashed potatoes) as patient indicates pain during the swallow.  RN notified.   ST to follow for possible diet upgrade.      Diet Recommendation  Continue with Current Diet: Dysphagia 1 (puree);Thin liquid    SLP Plan Continue with current plan of care      Swallowing Goals   progressing  General Temperature Spikes Noted: No Respiratory Status: Supplemental O2 delivered via (comment) Behavior/Cognition: Alert;Cooperative;Pleasant mood;Requires cueing Oral Cavity - Dentition: Missing dentition;Poor condition Patient Positioning: Upright in bed  Oral Cavity - Oral Hygiene Does patient have any of the following "at risk" factors?: Other - dysphagia Patient is HIGH RISK - Oral Care Protocol followed (see row info): Yes Patient is AT RISK - Oral Care Protocol followed (see row info): Yes   Dysphagia Treatment Treatment focused on: Skilled observation of diet tolerance;Patient/family/caregiver education;Facilitation of oral preparatory phase;Facilitation of oral phase;Facilitation of pharyngeal phase Treatment Methods/Modalities: Skilled observation;Differential diagnosis Type of PO's observed: Thin liquids;Ice chips Feeding: Needs  assist Liquids provided via: Cup Pharyngeal Phase Signs & Symptoms: Suspected delayed swallow initiation Type of cueing: Verbal Amount of cueing: Minimal      Moreen Fowler MS, CCC-SLP [phone]  [facility] 12/31/2011, 3:05 PM
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
## 2012-01-01 DIAGNOSIS — I5020 Unspecified systolic (congestive) heart failure: Secondary | ICD-10-CM

## 2012-01-01 LAB — GLUCOSE, CAPILLARY
Glucose-Capillary: 138 mg/dL — ABNORMAL HIGH (ref 70–99)
Glucose-Capillary: 90 mg/dL (ref 70–99)
Glucose-Capillary: 126 mg/dL — ABNORMAL HIGH (ref 70–99)

## 2012-01-01 MED ORDER — INSULIN GLARGINE 100 UNIT/ML ~~LOC~~ SOLN
15.0000 [IU] | SUBCUTANEOUS | Status: DC
  Administered 2012-01-02: 15 [IU] via SUBCUTANEOUS

## 2012-01-01 NOTE — Plan of Care (Signed)
Problem: Phase III Progression Outcomes Goal: Transfer/discharge plan in place Outcome: Completed/Met Date Met:  01/01/12 Pt to be d/c home tomorrow.
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
## 2012-01-02 LAB — GLUCOSE, CAPILLARY

## 2012-01-02 MED ORDER — INSULIN GLARGINE 100 UNIT/ML ~~LOC~~ SOLN: 15.0000 [IU] | Freq: Every day | SUBCUTANEOUS | Status: DC

## 2012-01-02 MED ORDER — PRAVASTATIN SODIUM 20 MG PO TABS: 20.0000 mg | ORAL_TABLET | Freq: Every day | ORAL | Status: DC

## 2012-01-02 MED ORDER — ASPIRIN 81 MG PO TBEC: 81.0000 mg | DELAYED_RELEASE_TABLET | Freq: Every day | ORAL | Status: DC
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
## 2012-01-02 NOTE — Progress Notes (Signed)
Speech Language Pathology Dysphagia Treatment Patient Details Name: [name] MRN: [id number] DOB: 12/21/1950 Today's Date: 01/02/2012 Time: 4098-1191 SLP Time Calculation (min): 25 min  Assessment / Plan / Recommendation Clinical Impression  Patient seen for diagnostic tx to determine toleration of current diet (dysphagia 1;puree, thin liquids) and for trial of dysphagia 2 (chopped) consistency. Patient exhibited prolonged mastication and oral transit with dysphagia 2 consistency, and cough response when taking sip of thin liquids immediately following bite of solid textures. Patient tolerated dysphagia 1 consistency with no overt s/s aspiration. Per RN, patient's sister, who is his caregiver, is already very knowledgeable with care for pt., and per documentation, patient was previously on dysphagia 1-dysphagia 1 diet (puree and chopped).     Diet Recommendation  Continue with Current Diet: Dysphagia 1 (puree);Thin liquid  Recommend that sister request [facility] SLP prior to advancement of patient's diet beyond current restrictions, if this is desired.  SLP Plan Continue with current plan of care    Pertinent Vitals/Pain PAINAD score:0, no changes in pt.'s vitals during this tx session.   Swallowing Goals  SLP Swallowing Goals Patient will consume recommended diet without observed clinical signs of aspiration with: Set-up;Minimal assistance;Supervision/safety Swallow Study Goal #1 - Progress: Progressing toward goal Patient will utilize recommended strategies during swallow to increase swallowing safety with: Minimal assistance  General Temperature Spikes Noted: No Behavior/Cognition: Alert;Cooperative;Requires cueing Oral Cavity - Dentition: Missing dentition;Poor condition Patient Positioning: Upright in bed  Oral Cavity - Oral Hygiene     Dysphagia Treatment Treatment focused on: Upgraded PO texture trials Family/Caregiver Educated: No family available during treatment  session Treatment Methods/Modalities: Skilled observation;Differential diagnosis Type of PO's observed: Dysphagia 2 (chopped);Dysphagia 1 (puree);Thin liquids Feeding: Needs assist Liquids provided via: Cup Oral Phase Signs & Symptoms: Prolonged mastication;Prolonged oral phase Pharyngeal Phase Signs & Symptoms: Suspected delayed swallow initiation Type of cueing: Verbal;Tactile Amount of cueing: Minimal   GO     [name] 01/02/2012, 1:23 PM  Scott Nevin, MA, CCC-SLP [facility] Speech-Language Pathologist

## 2012-01-02 NOTE — Patient Instructions (Signed)
Speech Therapy Recommendations: Continue with Dysphagia 1(puree) solids, Thin liquids. Assist patient with feeding. Swallow precautions: slow rate of eating, alternate bites and sips, sit upright during meal and for 30-45 minutes after meal, medications whole in puree. Watch for signs/symptoms of aspiration: immediate or delayed cough response or throat clearing, watering eyes. Angela Nevin, MA, CCC-SLP [facility] Speech-Language Pathologist
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
## 2012-02-13 ENCOUNTER — Encounter: Payer: Self-pay | Admitting: Cardiovascular Disease

## 2012-02-13 ENCOUNTER — Ambulatory Visit (INDEPENDENT_AMBULATORY_CARE_PROVIDER_SITE_OTHER): Payer: PRIVATE HEALTH INSURANCE | Admitting: Cardiovascular Disease

## 2012-02-13 VITALS — BP 158/90 | HR 85 | Ht 72.0 in

## 2012-02-13 DIAGNOSIS — I214 Non-ST elevation (NSTEMI) myocardial infarction: Secondary | ICD-10-CM

## 2012-02-13 DIAGNOSIS — I5020 Unspecified systolic (congestive) heart failure: Secondary | ICD-10-CM

## 2012-02-13 DIAGNOSIS — I4891 Unspecified atrial fibrillation: Secondary | ICD-10-CM

## 2012-02-13 MED ORDER — RIVAROXABAN 20 MG PO TABS: 20.0000 mg | ORAL_TABLET | Freq: Every day | ORAL | Status: DC

## 2012-02-13 MED ORDER — METOPROLOL TARTRATE 50 MG PO TABS: 50.0000 mg | ORAL_TABLET | Freq: Two times a day (BID) | ORAL | Status: DC

## 2012-02-13 NOTE — Patient Instructions (Addendum)
Your physician has requested that you have an echocardiogram. Echocardiography is a painless test that uses sound waves to create images of your heart. It provides your doctor with information about the size and shape of your heart and how well your heart's chambers and valves are working. This procedure takes approximately one hour. There are no restrictions for this procedure.  Your physician recommends that you schedule a follow-up appointment in: 3 MONTHS with Dr Excell Seltzer  Your physician has recommended you make the following change in your medication: INCREASE Metoprolol Tartrate to 50mg  take one by mouth twice a day, START Xarelto 20mg  take one by mouth with evening meal--STOP ASPIRIN WHEN YOU START THIS MEDICATION

## 2012-02-13 NOTE — Assessment & Plan Note (Signed)
The patient has chronic systolic heart failure. His left ventricular ejection fraction during his critical illness was in the range of 30-35%. I'm going to repeat an echocardiogram because he may have had cardiomyopathy of critical illness. Will increase his metoprolol to 50 mg twice daily. He will remain on lisinopril 40 mg daily. He has no evidence of volume overload or symptomatic heart failure at present. He is wheelchair-bound.

## 2012-02-13 NOTE — Assessment & Plan Note (Addendum)
The patient appears to have permanent atrial fibrillation. His risk of thromboembolism is extremely high with previous stroke, cardiomyopathy, hypertension, and diabetes. I don't think he is a good candidate for Coumadin because of dietary issues and difficulty with followup in blood draws. However, he seems to be a reasonable candidate for a newer anticoagulant. His kidney function is normal. Will prescribe Xarelto 20 mg daily. He should stop ASA. He has no past history of bleeding complications.

## 2012-02-13 NOTE — Assessment & Plan Note (Signed)
He is not an appropriate candidate for invasive cardiac studies such as catheterization or PCI.
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
## 2012-02-22 ENCOUNTER — Ambulatory Visit (HOSPITAL_COMMUNITY): Payer: PRIVATE HEALTH INSURANCE

## 2012-02-27 ENCOUNTER — Ambulatory Visit (HOSPITAL_COMMUNITY): Payer: PRIVATE HEALTH INSURANCE | Attending: Cardiology | Admitting: Radiology

## 2012-02-27 ENCOUNTER — Other Ambulatory Visit (HOSPITAL_COMMUNITY): Payer: Self-pay | Admitting: Cardiovascular Disease

## 2012-02-27 DIAGNOSIS — I059 Rheumatic mitral valve disease, unspecified: Secondary | ICD-10-CM | POA: Insufficient documentation

## 2012-02-27 DIAGNOSIS — I509 Heart failure, unspecified: Secondary | ICD-10-CM

## 2012-02-27 DIAGNOSIS — E119 Type 2 diabetes mellitus without complications: Secondary | ICD-10-CM | POA: Insufficient documentation

## 2012-02-27 DIAGNOSIS — I5020 Unspecified systolic (congestive) heart failure: Secondary | ICD-10-CM

## 2012-02-27 DIAGNOSIS — F17200 Nicotine dependence, unspecified, uncomplicated: Secondary | ICD-10-CM | POA: Insufficient documentation

## 2012-02-27 DIAGNOSIS — J4489 Other specified chronic obstructive pulmonary disease: Secondary | ICD-10-CM | POA: Insufficient documentation

## 2012-02-27 DIAGNOSIS — I369 Nonrheumatic tricuspid valve disorder, unspecified: Secondary | ICD-10-CM | POA: Insufficient documentation

## 2012-02-27 DIAGNOSIS — I214 Non-ST elevation (NSTEMI) myocardial infarction: Secondary | ICD-10-CM

## 2012-02-27 DIAGNOSIS — I4891 Unspecified atrial fibrillation: Secondary | ICD-10-CM

## 2012-02-27 DIAGNOSIS — J449 Chronic obstructive pulmonary disease, unspecified: Secondary | ICD-10-CM | POA: Insufficient documentation

## 2012-02-27 NOTE — Progress Notes (Signed)
Echocardiogram performed.  

## 2012-05-24 ENCOUNTER — Ambulatory Visit: Payer: Self-pay | Admitting: Cardiovascular Disease

## 2012-12-18 ENCOUNTER — Observation Stay (HOSPITAL_COMMUNITY)
Admission: EM | Admit: 2012-12-18 | Discharge: 2012-12-20 | Disposition: A | Discharge status: Home / Self Care | Payer: PRIVATE HEALTH INSURANCE | Attending: Internal Medicine | Admitting: Internal Medicine

## 2012-12-18 ENCOUNTER — Encounter (HOSPITAL_COMMUNITY): Payer: Self-pay | Admitting: Emergency Medicine

## 2012-12-18 ENCOUNTER — Emergency Department (HOSPITAL_COMMUNITY): Payer: PRIVATE HEALTH INSURANCE

## 2012-12-18 DIAGNOSIS — D72829 Elevated white blood cell count, unspecified: Secondary | ICD-10-CM | POA: Diagnosis present

## 2012-12-18 DIAGNOSIS — J449 Chronic obstructive pulmonary disease, unspecified: Secondary | ICD-10-CM | POA: Insufficient documentation

## 2012-12-18 DIAGNOSIS — I519 Heart disease, unspecified: Secondary | ICD-10-CM | POA: Diagnosis present

## 2012-12-18 DIAGNOSIS — I4891 Unspecified atrial fibrillation: Secondary | ICD-10-CM | POA: Diagnosis present

## 2012-12-18 DIAGNOSIS — S129XXA Fracture of neck, unspecified, initial encounter: Secondary | ICD-10-CM

## 2012-12-18 DIAGNOSIS — R739 Hyperglycemia, unspecified: Secondary | ICD-10-CM

## 2012-12-18 DIAGNOSIS — I10 Essential (primary) hypertension: Secondary | ICD-10-CM | POA: Diagnosis present

## 2012-12-18 DIAGNOSIS — S88119A Complete traumatic amputation at level between knee and ankle, unspecified lower leg, initial encounter: Secondary | ICD-10-CM

## 2012-12-18 DIAGNOSIS — E119 Type 2 diabetes mellitus without complications: Secondary | ICD-10-CM | POA: Insufficient documentation

## 2012-12-18 DIAGNOSIS — I69959 Hemiplegia and hemiparesis following unspecified cerebrovascular disease affecting unspecified side: Secondary | ICD-10-CM | POA: Insufficient documentation

## 2012-12-18 DIAGNOSIS — Z8673 Personal history of transient ischemic attack (TIA), and cerebral infarction without residual deficits: Secondary | ICD-10-CM

## 2012-12-18 DIAGNOSIS — IMO0002 Reserved for concepts with insufficient information to code with codable children: Secondary | ICD-10-CM

## 2012-12-18 DIAGNOSIS — R0602 Shortness of breath: Secondary | ICD-10-CM | POA: Insufficient documentation

## 2012-12-18 DIAGNOSIS — S12100A Unspecified displaced fracture of second cervical vertebra, initial encounter for closed fracture: Principal | ICD-10-CM | POA: Insufficient documentation

## 2012-12-18 DIAGNOSIS — S0101XA Laceration without foreign body of scalp, initial encounter: Secondary | ICD-10-CM

## 2012-12-18 DIAGNOSIS — R1310 Dysphagia, unspecified: Secondary | ICD-10-CM | POA: Diagnosis present

## 2012-12-18 DIAGNOSIS — Y9210 Unspecified residential institution as the place of occurrence of the external cause: Secondary | ICD-10-CM | POA: Insufficient documentation

## 2012-12-18 DIAGNOSIS — S78119A Complete traumatic amputation at level between unspecified hip and knee, initial encounter: Secondary | ICD-10-CM

## 2012-12-18 DIAGNOSIS — W19XXXA Unspecified fall, initial encounter: Secondary | ICD-10-CM | POA: Insufficient documentation

## 2012-12-18 DIAGNOSIS — L89152 Pressure ulcer of sacral region, stage 2: Secondary | ICD-10-CM | POA: Diagnosis present

## 2012-12-18 DIAGNOSIS — J4489 Other specified chronic obstructive pulmonary disease: Secondary | ICD-10-CM | POA: Insufficient documentation

## 2012-12-18 DIAGNOSIS — E1165 Type 2 diabetes mellitus with hyperglycemia: Secondary | ICD-10-CM

## 2012-12-18 DIAGNOSIS — E46 Unspecified protein-calorie malnutrition: Secondary | ICD-10-CM | POA: Diagnosis present

## 2012-12-18 LAB — URINALYSIS, ROUTINE W REFLEX MICROSCOPIC
Bilirubin Urine: NEGATIVE
Glucose, UA: >1000 mg/dL — AB
Ketones, ur: 15 mg/dL — AB
Leukocytes, UA: NEGATIVE
Nitrite: NEGATIVE
Protein, ur: 100 mg/dL — AB
Specific Gravity, Urine: 1.037 — ABNORMAL HIGH (ref 1.005–1.030)
Urobilinogen, UA: 2 mg/dL — ABNORMAL HIGH (ref 0.0–1.0)
pH: 5.5 (ref 5.0–8.0)

## 2012-12-18 LAB — BASIC METABOLIC PANEL
GFR calc Af Amer: >90 mL/min (ref >90)
GFR calc non Af Amer: >90 mL/min (ref >90)
Potassium: 3.5 mEq/L (ref 3.5–5.1)
Sodium: 130 mEq/L — ABNORMAL LOW (ref 135–145)

## 2012-12-18 LAB — URINE MICROSCOPIC-ADD ON

## 2012-12-18 LAB — CBC WITH DIFFERENTIAL/PLATELET
Eosinophils Absolute: 0.2 10*3/uL (ref 0.0–0.7)
Lymphocytes Relative: 19 % (ref 12–46)
Lymphs Abs: 2.3 10*3/uL (ref 0.7–4.0)
MCH: 29.5 pg (ref 26.0–34.0)
Neutrophils Relative %: 71 % (ref 43–77)
Platelets: 193 10*3/uL (ref 150–400)
RBC: 5.16 MIL/uL (ref 4.22–5.81)
WBC: 12.2 10*3/uL — ABNORMAL HIGH (ref 4.0–10.5)

## 2012-12-18 MED ORDER — INSULIN ASPART 100 UNIT/ML ~~LOC~~ SOLN
6.0000 [IU] | Freq: Once | SUBCUTANEOUS | Status: AC
  Administered 2012-12-18: 6 [IU] via INTRAVENOUS
  Filled 2012-12-18: qty 1

## 2012-12-18 MED ORDER — SODIUM CHLORIDE 0.9 % IV BOLUS (SEPSIS)
1000.0000 mL | Freq: Once | INTRAVENOUS | Status: AC
  Administered 2012-12-18: 1000 mL via INTRAVENOUS
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
## 2012-12-18 NOTE — ED Notes (Signed)
Waiting for family to return to discharge pt, PA aware.

## 2012-12-18 NOTE — ED Provider Notes (Signed)
History    CSN: [id number] Arrival date & time 12/18/12  1447  First MD Initiated Contact with Patient 12/18/12 1504     Chief Complaint  Patient presents with  . Failure To Thrive   (Consider location/radiation/quality/duration/timing/severity/associated sxs/prior Treatment) HPI Patient presents to the emergency department with decreased appetite over the last week the family gives.  The patient history since the patient is unable to speak due to previous significant stroke.  The sister states, that he is not eating in his normal fashion.  He is normally weak on his right side and has amputation of both legs.  Sister she the patient's not had any vomiting, diarrhea, lethargy, fever, or syncope.  Sister, states, that she normally has no trouble feeding him. Past Medical History  Diagnosis Date  . Active smoker   . Alcohol abuse, episodic   . Difficult intubation   . Hypertension   . Dysrhythmia   . COPD (chronic obstructive pulmonary disease)   . Shortness of breath   . Diabetes mellitus   . Peripheral vascular disease   . CHF (congestive heart failure)   . Stroke     has expressive aphasia, weak on Rt. side   Past Surgical History  Procedure Laterality Date  . Vascular surgery    . Leg amputation above knee      right  . Leg amputation below knee      left  . Laryngoscopy  12/27/2011    Procedure: LARYNGOSCOPY;  Surgeon: Christia Reading, MD;  Location: [facility] OR;  Service: ENT;  Laterality: N/A;  direct video laryngoscopy with evaluation of upper airway.reintubation.   History reviewed. No pertinent family history. History  Substance Use Topics  . Smoking status: Former Smoker -- 1.00 packs/day for 30 years    Types: Cigarettes  . Smokeless tobacco: Never Used  . Alcohol Use: Yes    Review of Systems Level V caveat applies due to aphasia from a previous stroke Allergies  Review of patient's allergies indicates no known allergies.  Home Medications   Current  Outpatient Rx  Name  Route  Sig  Dispense  Refill  . aspirin EC 81 MG tablet   Oral   Take 81 mg by mouth every morning.         . digoxin (LANOXIN) 0.125 MG tablet   Oral   Take 0.125 mg by mouth every morning.         Marland Kitchen glipiZIDE (GLUCOTROL) 5 MG tablet   Oral   Take 5 mg by mouth 2 (two) times daily before a meal.         . HYDROcodone-acetaminophen (NORCO/VICODIN) 5-325 MG per tablet   Oral   Take 1 tablet by mouth every 6 (six) hours as needed for pain.         Marland Kitchen insulin glargine (LANTUS) 100 UNIT/ML injection   Subcutaneous   Inject 5 Units into the skin every morning.         Marland Kitchen lisinopril (PRINIVIL,ZESTRIL) 20 MG tablet   Oral   Take 20 mg by mouth every morning.         . pravastatin (PRAVACHOL) 20 MG tablet   Oral   Take 20 mg by mouth every morning.          BP 172/96  Pulse 75  Temp(Src) 99.2 F (37.3 C) (Oral)  Resp 17  SpO2 99% Physical Exam  Nursing note and vitals reviewed. Constitutional: He appears well-developed and well-nourished. No distress.  HENT:  Head: Normocephalic and atraumatic.  Mouth/Throat: Oropharynx is clear and moist.  Eyes: Pupils are equal, round, and reactive to light.  Neck: Normal range of motion. Neck supple.  Cardiovascular: Normal rate, regular rhythm and normal heart sounds.  Exam reveals no gallop and no friction rub.   No murmur heard. Pulmonary/Chest: Effort normal and breath sounds normal. No respiratory distress. He has no wheezes. He has no rales.  Abdominal: Soft. Bowel sounds are normal. He exhibits no distension. There is no tenderness.  Neurological: He is alert.  Skin: Skin is warm and dry. No rash noted. No erythema.    ED Course  Procedures (including critical care time) Labs Reviewed  BASIC METABOLIC PANEL - Abnormal; Notable for the following:    Sodium 130 (*)    Chloride 89 (*)    Glucose, Bld 561 (*)    All other components within normal limits  CBC WITH DIFFERENTIAL - Abnormal;  Notable for the following:    WBC 12.2 (*)    Neutro Abs 8.7 (*)    All other components within normal limits  URINALYSIS, ROUTINE W REFLEX MICROSCOPIC - Abnormal; Notable for the following:    Specific Gravity, Urine 1.037 (*)    Glucose, UA >1000 (*)    Hgb urine dipstick TRACE (*)    Ketones, ur 15 (*)    Protein, ur 100 (*)    Urobilinogen, UA 2.0 (*)    All other components within normal limits  URINE MICROSCOPIC-ADD ON - Abnormal; Notable for the following:    Bacteria, UA FEW (*)    All other components within normal limits  GLUCOSE, CAPILLARY - Abnormal; Notable for the following:    Glucose-Capillary 277 (*)    All other components within normal limits  LIPASE, BLOOD   Dg Chest 2 View  12/18/2012   *RADIOLOGY REPORT*  Clinical Data: Weakness.  CHEST - 2 VIEW  Comparison: Chest x-ray 10/22/2010.  Findings: Lung volumes are normal.  No acute consolidative airspace disease.  No pleural effusions.  Multiple bullae are noted in the lung apices bilaterally (left greater than right).  No evidence of pulmonary edema.  Heart size is normal.  Mediastinal contours are within normal limits.  IMPRESSION: 1.  No radiographic evidence of acute cardiopulmonary disease. 2.  Mild emphysematous changes in the lung apices bilaterally (left greater than right) again noted.   Original Report Authenticated By: Trudie Reed, M.D.   patient was monitored here for several hours.  Given IV fluids and insulin, blood sugars responded appropriately.  Patient was going to be discharged home when the sister setting up on the edge of the bed and he fell forward, landing on his face.  Patient has a small laceration to the superior aspect of his midforehead.  Patient had a CT scan is ordered of his head and neck and face.  CT scan shows that he has C2-C1 fractures.  Patient has a difficult.  Neurological exam based on the fact that he has significant weakness from previous strokes and amputations of both legs.  The  nurse alerted me to the fact that the patient had fallen he was assessed immediately.  Patient does have some loose teeth as well  The patient will be admitted for observation following the fall.  I spoke with the, [facility], who will admit the patient also, will speak with neurosurgery in about consultation in the morning.  MDM  MDM Reviewed: vitals and nursing note Interpretation: labs  Carlyle Dolly, PA-C 12/19/12 [phone]

## 2012-12-18 NOTE — ED Notes (Signed)
Pt family states that pt has not been eating x2 weeks. Drinks plenty of fluids. Hx diabetes. NAD at this time.

## 2012-12-19 DIAGNOSIS — E1165 Type 2 diabetes mellitus with hyperglycemia: Secondary | ICD-10-CM | POA: Diagnosis present

## 2012-12-19 DIAGNOSIS — Z89511 Acquired absence of right leg below knee: Secondary | ICD-10-CM | POA: Insufficient documentation

## 2012-12-19 DIAGNOSIS — Z8673 Personal history of transient ischemic attack (TIA), and cerebral infarction without residual deficits: Secondary | ICD-10-CM

## 2012-12-19 DIAGNOSIS — S0100XA Unspecified open wound of scalp, initial encounter: Secondary | ICD-10-CM

## 2012-12-19 DIAGNOSIS — R7309 Other abnormal glucose: Secondary | ICD-10-CM

## 2012-12-19 DIAGNOSIS — S0101XA Laceration without foreign body of scalp, initial encounter: Secondary | ICD-10-CM

## 2012-12-19 DIAGNOSIS — E46 Unspecified protein-calorie malnutrition: Secondary | ICD-10-CM

## 2012-12-19 DIAGNOSIS — S129XXA Fracture of neck, unspecified, initial encounter: Secondary | ICD-10-CM

## 2012-12-19 DIAGNOSIS — L89152 Pressure ulcer of sacral region, stage 2: Secondary | ICD-10-CM | POA: Diagnosis present

## 2012-12-19 DIAGNOSIS — IMO0002 Reserved for concepts with insufficient information to code with codable children: Secondary | ICD-10-CM | POA: Diagnosis present

## 2012-12-19 DIAGNOSIS — D72829 Elevated white blood cell count, unspecified: Secondary | ICD-10-CM

## 2012-12-19 DIAGNOSIS — S78119A Complete traumatic amputation at level between unspecified hip and knee, initial encounter: Secondary | ICD-10-CM

## 2012-12-19 DIAGNOSIS — I519 Heart disease, unspecified: Secondary | ICD-10-CM | POA: Diagnosis present

## 2012-12-19 DIAGNOSIS — S88119A Complete traumatic amputation at level between knee and ankle, unspecified lower leg, initial encounter: Secondary | ICD-10-CM

## 2012-12-19 LAB — CBC
HCT: 41.3 % (ref 39.0–52.0)
Hemoglobin: 14.1 g/dL (ref 13.0–17.0)
MCHC: 34.1 g/dL (ref 30.0–36.0)
MCV: 84.1 fL (ref 78.0–100.0)
RDW: 13.5 % (ref 11.5–15.5)
WBC: 14.1 10*3/uL — ABNORMAL HIGH (ref 4.0–10.5)

## 2012-12-19 LAB — DIGOXIN LEVEL: Digoxin Level: <0.3 ng/mL — ABNORMAL LOW (ref 0.8–2.0)

## 2012-12-19 LAB — CREATININE, SERUM: GFR calc non Af Amer: >90 mL/min (ref >90)

## 2012-12-19 LAB — GLUCOSE, CAPILLARY
Glucose-Capillary: 179 mg/dL — ABNORMAL HIGH (ref 70–99)
Glucose-Capillary: 194 mg/dL — ABNORMAL HIGH (ref 70–99)

## 2012-12-19 MED ORDER — SIMVASTATIN 10 MG PO TABS
10.0000 mg | ORAL_TABLET | Freq: Every day | ORAL | Status: DC
  Administered 2012-12-19: 10 mg via ORAL
  Filled 2012-12-19 (×2): qty 1

## 2012-12-19 MED ORDER — MORPHINE SULFATE 2 MG/ML IJ SOLN
2.0000 mg | INTRAMUSCULAR | Status: DC | PRN
  Administered 2012-12-19: 2 mg via INTRAVENOUS
  Administered 2012-12-19: 1 mg via INTRAVENOUS
  Administered 2012-12-20: 2 mg via INTRAVENOUS
  Filled 2012-12-19 (×3): qty 1

## 2012-12-19 MED ORDER — INSULIN ASPART 100 UNIT/ML ~~LOC~~ SOLN: 0.0000 [IU] | Freq: Every day | SUBCUTANEOUS | Status: DC

## 2012-12-19 MED ORDER — GLUCERNA SHAKE PO LIQD
237.0000 mL | Freq: Three times a day (TID) | ORAL | Status: DC
  Administered 2012-12-20: 237 mL via ORAL

## 2012-12-19 MED ORDER — INSULIN GLARGINE 100 UNIT/ML ~~LOC~~ SOLN
10.0000 [IU] | Freq: Every morning | SUBCUTANEOUS | Status: DC
  Administered 2012-12-19 – 2012-12-20 (×2): 10 [IU] via SUBCUTANEOUS
  Filled 2012-12-19 (×2): qty 0.1

## 2012-12-19 MED ORDER — ONDANSETRON HCL 4 MG PO TABS: 4.0000 mg | ORAL_TABLET | Freq: Four times a day (QID) | ORAL | Status: DC | PRN

## 2012-12-19 MED ORDER — SODIUM CHLORIDE 0.9 % IV SOLN
INTRAVENOUS | Status: DC
  Administered 2012-12-19 (×2): via INTRAVENOUS

## 2012-12-19 MED ORDER — HYDROCODONE-ACETAMINOPHEN 5-325 MG PO TABS
1.0000 | ORAL_TABLET | Freq: Four times a day (QID) | ORAL | Status: DC
  Administered 2012-12-19 – 2012-12-20 (×3): 1 via ORAL
  Filled 2012-12-19 (×3): qty 1

## 2012-12-19 MED ORDER — ONDANSETRON HCL 4 MG/2ML IJ SOLN: 4.0000 mg | Freq: Four times a day (QID) | INTRAMUSCULAR | Status: DC | PRN

## 2012-12-19 MED ORDER — INSULIN ASPART 100 UNIT/ML ~~LOC~~ SOLN
0.0000 [IU] | Freq: Three times a day (TID) | SUBCUTANEOUS | Status: DC
  Administered 2012-12-19: 4 [IU] via SUBCUTANEOUS
  Administered 2012-12-19: 11 [IU] via SUBCUTANEOUS
  Administered 2012-12-19: 4 [IU] via SUBCUTANEOUS
  Administered 2012-12-20: 11 [IU] via SUBCUTANEOUS

## 2012-12-19 MED ORDER — DOCUSATE SODIUM 100 MG PO CAPS
100.0000 mg | ORAL_CAPSULE | Freq: Two times a day (BID) | ORAL | Status: DC
  Administered 2012-12-20: 100 mg via ORAL

## 2012-12-19 MED ORDER — GLIPIZIDE 5 MG PO TABS
5.0000 mg | ORAL_TABLET | Freq: Two times a day (BID) | ORAL | Status: DC
  Administered 2012-12-19 – 2012-12-20 (×2): 5 mg via ORAL
  Filled 2012-12-19 (×5): qty 1

## 2012-12-19 MED ORDER — SENNOSIDES-DOCUSATE SODIUM 8.6-50 MG PO TABS: 1.0000 | ORAL_TABLET | Freq: Every evening | ORAL | Status: DC | PRN

## 2012-12-19 MED ORDER — LISINOPRIL 20 MG PO TABS
20.0000 mg | ORAL_TABLET | Freq: Every morning | ORAL | Status: DC
  Administered 2012-12-19 – 2012-12-20 (×2): 20 mg via ORAL
  Filled 2012-12-19 (×2): qty 1

## 2012-12-19 MED ORDER — BIOTENE DRY MOUTH MT LIQD
15.0000 mL | Freq: Two times a day (BID) | OROMUCOSAL | Status: DC
  Administered 2012-12-19 – 2012-12-20 (×3): 15 mL via OROMUCOSAL

## 2012-12-19 MED ORDER — DIGOXIN 125 MCG PO TABS
0.1250 mg | ORAL_TABLET | Freq: Every morning | ORAL | Status: DC
  Administered 2012-12-19 – 2012-12-20 (×2): 0.125 mg via ORAL
  Filled 2012-12-19 (×2): qty 1

## 2012-12-19 NOTE — ED Provider Notes (Signed)
Medical screening examination/treatment/procedure(s) were conducted as a shared visit with non-physician practitioner(s) and myself.  I personally evaluated the patient during the encounter. Patient with generalized weakness. Found to be hyperglycemic. Sugar had improved with fluids and insulin. Patient was going to be discharged home. Well-nourished was getting a wheelchair patient was sat up on the end of the bed with her family member and fell forward striking his head. A small laceration that was repaired. He also has a C1 and C2 fracture. No new neuro deficits seen. He is somewhat of a difficult examination due to his chronic weakness. Patient was immobilized. Neurosurgery was consulted. Patient be admitted to [facility] for neuro checks, fitting of brace, and neurosurgery consult. He will likely followup in the office with Dr. Enzo Montgomery R. Rubin Payor, MD 12/19/12 5784

## 2012-12-19 NOTE — Progress Notes (Signed)
I agree with the following treatment note after reviewing documentation.   Johnston, Nusaybah Ivie Brynn   OTR/L Pager: [phone] Office: [phone] .   

## 2012-12-19 NOTE — Evaluation (Signed)
Clinical/Bedside Swallow Evaluation Patient Details  Name: [name] MRN: [id number] Date of Birth: 11/30/1950  Today's Date: 12/19/2012 Time: 1320-1410 SLP Time Calculation (min): 50 min  Past Medical History:  Past Medical History  Diagnosis Date  . Active smoker   . Alcohol abuse, episodic   . Difficult intubation   . Hypertension   . Dysrhythmia   . COPD (chronic obstructive pulmonary disease)   . Shortness of breath   . Diabetes mellitus   . Peripheral vascular disease   . CHF (congestive heart failure)   . 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 Vascular surgery    . Leg amputation above knee      right  . Leg amputation below knee      left  . Laryngoscopy  12/27/2011    Procedure: LARYNGOSCOPY;  Surgeon: Christia Reading, MD;  Location: [facility] OR;  Service: ENT;  Laterality: N/A;  direct video laryngoscopy with evaluation of upper airway.reintubation.   HPI:  62 year old male admitted 12/18/12. Pt seen at [facility] ED due to failure to thrive, and poor po intake, but fell just prior to leaving, sustaining cervical fx.  Pt with PMH COPD, and CVA with expressive aphasia. Now with Aspen collar in place.  BSE ordered to evaluate swallow safety.   Assessment / Plan / Recommendation Clinical Impression  Initially, pt did not exhibit overt s/s aspiration with any consistency tested.  However, after trials with SLP ended, pt was noted to have significant increase in coughing.  Given history of CVA, current cervical collar placement, and bedsdie presentation, NPO status is recommended, with completion of MBS to determine least restrictive diet level. RN and sister aware.    Aspiration Risk  Moderate    Diet Recommendation NPO   Medication Administration: Crushed with puree    Other  Recommendations Recommended Consults: MBS   Follow Up Recommendations  24 hour supervision/assistance     Frequency and Duration  plan of care to be established following MBS     Pertinent Vitals/Pain 4 - PAINAD    Swallow Study Prior Functional Status   tolerating soft diet/chopped meats, thin liquids at home.    General Date of Onset: 12/18/12 HPI: 62 year old male admitted 12/18/12. Pt seen at [facility] ED due to failure to thrive, and poor po intake, but fell just prior to leaving, sustaining cervical fx.  Pt with PMH COPD, and CVA with expressive aphasia. Now with Aspen collar in place.  BSE ordered to evaluate swallow safety. Type of Study: Bedside swallow evaluation Diet Prior to this Study: NPO Temperature Spikes Noted: No Respiratory Status: Room air History of Recent Intubation: No Behavior/Cognition: Alert;Cooperative Oral Cavity - Dentition: Missing dentition Self-Feeding Abilities: Able to feed self;Needs assist Patient Positioning: Upright in bed Baseline Vocal Quality: Clear;Low vocal intensity Volitional Cough:  (unable - apraxic?) Volitional Swallow: Unable to elicit    Oral/Motor/Sensory Function Overall Oral Motor/Sensory Function: Impaired at baseline (right weakness, lingual deviation from CVA 5 years ago.) Labial ROM: Reduced right Labial Symmetry: Abnormal symmetry right Labial Strength: Reduced Lingual ROM: Reduced right Lingual Symmetry: Abnormal symmetry right Lingual Strength: Reduced Facial ROM: Reduced right Facial Symmetry: Right droop Facial Strength: Reduced Velum: Impaired right Mandible: Within Functional Limits   Ice Chips Ice chips: Within functional limits Presentation: Spoon   Thin Liquid Thin Liquid: Impaired Presentation: Straw Pharyngeal  Phase  Impairments: Suspected delayed Swallow;Cough - Delayed;Multiple swallows    Nectar Thick Nectar Thick Liquid: Not tested   Honey Thick Honey Thick Liquid: Not tested   Puree Puree: Impaired Presentation: Self Fed;Spoon Pharyngeal Phase Impairments: Suspected delayed Swallow;Cough - Delayed;Multiple  swallows   Solid  Scott B. Murvin [facility], CCC-SLP [phone]    Solid: Impaired Presentation: Self Fed (impulsive) Pharyngeal Phase Impairments: Suspected delayed Swallow;Cough - Delayed       [name] 12/19/2012,2:24 PM

## 2012-12-19 NOTE — ED Notes (Signed)
Gave report to [facility]. ETA 10 minutes

## 2012-12-19 NOTE — Evaluation (Addendum)
Physical Therapy Evaluation Patient Details Name: [name] MRN: [id number] DOB: 1951-01-22 Today's Date: 12/19/2012 Time: 4098-1191 PT Time Calculation (min): 22 min  PT Assessment / Plan / Recommendation History of Present Illness  62 y.o. 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 He also has BLE ampuitations. Went to [facility] for insulin treatment (was found to have sugar of over 500 but not DKA) When preparing to leave [facility], he fell face first on the floor. CT of the brain showed no skull fracture or intracranial issue. CT of the maxillofacial bones showed no fracture. CT of the C-spine showed acute C2 body fracture with 4 mm fracture widening and mild posterior bony retropulsion up to the margin with the dens where there is an axillary oriented fracture extending to the anterior body. Fracture extends into the lateral masses bilaterally reaching to the C1-C2 facet joints. Bilateral C1 arch fractures, nondisplaced. Hemorrhage around C1-C2 when combined with mild posterior retropulsion at C2 likely some cord flattening. In aspen collar.  Clinical Impression  Presents to PT today close to his baseline with mobility limited by R hemiplegia, R AKA and L BKA. Now further limited primarily by pain and fatigue following the accident yesterday. Per sister patient is dependent for bed mobility and transfers bed<>w/c. Sister is very involved in his care and feels prepared to take him home. Acute PT to maximize patient's safe mobility so as to decrease burden of care on d/c.     PT Assessment  Patient needs continued PT services    Follow Up Recommendations  Home health PT;Supervision/Assistance - 24 hour     Does the patient have the potential to tolerate intense rehabilitation      Barriers to Discharge        Equipment Recommendations  None recommended by PT    Recommendations for Other Services     Frequency Min 3X/week    Precautions / Restrictions  Precautions Precautions: Cervical Required Braces or Orthoses: Cervical Brace Cervical Brace: Hard collar;At all times Restrictions Weight Bearing Restrictions: No   Pertinent Vitals/Pain Patient c/o 2-3/10 pain      Mobility  Bed Mobility Bed Mobility: Rolling Right;Right Sidelying to Sit;Sit to Supine Rolling Right: 2: Max assist;With rail Right Sidelying to Sit: 2: Max assist Sit to Supine: 2: Max assist Details for Bed Mobility Assistance: facilitation for all aspects of transfer, limited by weakness and pain Transfers Transfers: Not assessed Details for Transfer Assistance: pt is dependent lift transfer at baseline Ambulation/Gait Ambulation/Gait Assistance: Not tested (comment) Ambulation/Gait Assistance Details: w/c and bedbound at baseline Stairs: No Wheelchair Mobility Wheelchair Mobility: No         PT Diagnosis: Generalized weakness;Acute pain  PT Problem List: Decreased strength;Decreased activity tolerance;Decreased balance;Pain;Decreased cognition;Decreased knowledge of precautions PT Treatment Interventions: Functional mobility training;Patient/family education;Balance training;Therapeutic activities     PT Goals(Current goals can be found in the care plan section) Acute Rehab PT Goals Patient Stated Goal: sister wishes for patient to be moving easier to decrease her BOC PT Goal Formulation: With patient Time For Goal Achievement: 12/26/12 Potential to Achieve Goals: Fair  Visit Information  Last PT Received On: 12/19/12 Assistance Needed: +2 History of Present Illness: 62 y.o. 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 He also has BLE ampuitations. Went to [facility] for insulin treatment (was found to have sugar of over 500 but not DKA) When preparing to leave [facility], he fell face first on the floor. CT of  the brain showed no skull fracture or intracranial issue. CT of the maxillofacial bones showed no fracture. CT of the C-spine showed acute C2  body fracture with 4 mm fracture widening and mild posterior bony retropulsion up to the margin with the dens where there is an axillary oriented fracture extending to the anterior body. Fracture extends into the lateral masses bilaterally reaching to the C1-C2 facet joints. Bilateral C1 arch fractures, nondisplaced. Hemorrhage around C1-C2 when combined with mild posterior retropulsion at C2 likely some cord flattening. In aspen collar.       Prior Functioning  Home Living Family/patient expects to be discharged to:: Private residence Living Arrangements: Other relatives (Sister POA) Available Help at Discharge: Family;Available 24 hours/day Type of Home: House Home Access: Stairs to enter [employer] of Steps: 2 Home Layout: One level Home Equipment: Bedside commode;Shower seat;Wheelchair - Engineer, technical sales - power;Hospital bed;Other (comment) (trapeze bar) Additional Comments: Sister has been POA and caregiver for 5 years Prior Function Level of Independence: Needs assistance Gait / Transfers Assistance Needed: does not ambulate, total assist for transfers, needs total assist to sit up, but can hold sitting position if he has something to hold onto  ADL's / Homemaking Assistance Needed: total dependence Communication / Swallowing Assistance Needed: baseline aphasia Comments: will communicate by pointing to photos of pain level, or words to describe comfort Communication Communication: Expressive difficulties;Other (comment) (non-verbal) Dominant Hand: Left    Cognition  Cognition Arousal/Alertness: Awake/alert Behavior During Therapy: WFL for tasks assessed/performed Overall Cognitive Status: History of cognitive impairments - at baseline    Extremity/Trunk Assessment Upper Extremity Assessment Upper Extremity Assessment: Defer to OT evaluation RUE Deficits / Details: completely flacid from prior stroke, hand is forming contracture in digits, tone in elbow and  wrist. 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   Balance Balance Balance Assessed: Yes Static Sitting Balance Static Sitting - Balance Support: Left upper extremity supported Static Sitting - Level of Assistance: 4: Min assist Static Sitting - Comment/# of Minutes: initially needing modA for stability, with verbal cues for midline and patient holding onto the rail he was able to sit EOB with minA for stability (tendency is to fall to the right)  End of Session PT - End of Session Activity Tolerance: Patient tolerated treatment well Patient left: in bed;with call bell/phone within reach Nurse Communication: Mobility status  GP PT G-Codes **NOT FOR INPATIENT CLASS**  Functional Assessment Tool Used Clinical Judgement  Functional Limitation Changing and maintaining body position  Changing and Maintaining Body Position Current Status (Z6109) CM  Changing and Maintaining Body Position Goal Status (U0454)  G-codes added at later date     Ivonne Andrew PT, DPT 12/20/12 Pager: [phone]

## 2012-12-19 NOTE — ED Notes (Signed)
Ice placed on affected area to reduce swelling at this time

## 2012-12-19 NOTE — ED Notes (Signed)
C-spine maintained while Aspen collar applied at this time

## 2012-12-19 NOTE — ED Notes (Addendum)
                                                                           Please contact Bjorn Loser (sister/POA) [phone] when pt is moved to a room.  Pts family has requested pt not be transferred to [facility].

## 2012-12-19 NOTE — Consult Note (Signed)
WOC consult Note Reason for Consult:Sacral pressure ulcer.  (Paid) caregiver in room and reports that "skin tear may be due to the way I pull him up in bed.  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 Wound bed: Clean, pink, moist Drainage (amount, consistency, odor) scant amount lit yellow drainage on old dressing placed yesterday Periwound:intact Dressing procedure/placement/frequency: I am providing orders for side to side positioning while in bed and the use of a pressure redistribution chair pad while OOB in chair.  House skin care products are provided as well as orders for a soft silicone foam moisture retentive dressing (Allevyn) to be placed and changed twice weekly. I will not follow.  Please re-consult if needed. Thanks, Ladona Mow, MSN, RN, [facility], CWOCN [phone])

## 2012-12-19 NOTE — Progress Notes (Signed)
Occupational Therapy Evaluation Patient Details Name: [name] MRN: [id number] DOB: 1950/12/22 Today's Date: 12/19/2012 Time: 4098-1191 OT Time Calculation (min): 23 min  OT Assessment / Plan / Recommendation History of present illness 62 y.o. 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 He also has BLE ampuitations. Went to [facility] for insulin treatment (was found to have sugar of over 500 but not DKA) When preparing to leave [facility], he fell face first on the floor. CT of the brain showed no skull fracture or intracranial issue. CT of the maxillofacial bones showed no fracture. CT of the C-spine showed acute C2 body fracture with 4 mm fracture widening and mild posterior bony retropulsion up to the margin with the dens where there is an axillary oriented fracture extending to the anterior body. Fracture extends into the lateral masses bilaterally reaching to the C1-C2 facet joints. Bilateral C1 arch fractures, nondisplaced. Hemorrhage around C1-C2 when combined with mild posterior retropulsion at C2 likely some cord flattening. In aspen collar.   Clinical Impression   Per sister (POA and caregiver) Pt is at baseline with mobility limited by R hemiplegia, R AKA and L BKA. Now further limited primarily by pain and fatigue following the accident yesterday. Per sister patient is dependent for bed mobility and transfers bed<>w/c. Sister is very involved in his care and feels prepared to take him home. Pt would benefit from skilled OT in the acute setting before d/c home with HHOT to educate sister and ensure safety during functional transfers and improve sitting balance. Next session check vision.     OT Assessment  Patient needs continued OT Services    Follow Up Recommendations  Home health OT       Equipment Recommendations  None recommended by OT       Frequency  Min 3X/week    Precautions / Restrictions Precautions Precautions: Cervical Required Braces or Orthoses:  Cervical Brace Cervical Brace: Hard collar;At all times Restrictions Weight Bearing Restrictions: No   Pertinent Vitals/Pain Pt reported 3/10 pain by pointing to printed out pain scale    ADL  Eating/Feeding: NPO Grooming: +1 Total assistance Where Assessed - Grooming: Supine, head of bed up Upper Body Bathing: +1 Total assistance Where Assessed - Upper Body Bathing: Supine, head of bed up Lower Body Bathing: +1 Total assistance Where Assessed - Lower Body Bathing: Supine, head of bed up Upper Body Dressing: +1 Total assistance Where Assessed - Upper Body Dressing: Supine, head of bed up Lower Body Dressing: +1 Total assistance Where Assessed - Lower Body Dressing: Supine, head of bed up Toilet Transfer: +2 Total assistance Toilet Transfer: Patient Percentage: 0% Toilet Transfer Equipment: Other (comment) (diapers) Toileting - Clothing Manipulation and Hygiene: +1 Total assistance Where Assessed - Toileting Clothing Manipulation and Hygiene: Supine, head of bed flat Tub/Shower Transfer: +2 Total assistance Tub/Shower Transfer: Patient Percentage: 0% Tub/Shower Transfer Method: Other (comment) (shower chair) Tub/Shower Transfer Equipment: Counsellor Used: Other (comment) (cervical brace) Transfers/Ambulation Related to ADLs: +2 total for transfers, does not ambulate ADL Comments: total assist for all ADL    OT Diagnosis: Generalized weakness;Acute pain  OT Problem List: Decreased strength;Decreased range of motion;Decreased activity tolerance;Impaired balance (sitting and/or standing);Decreased knowledge of precautions;Pain OT Treatment Interventions: Patient/family education;Balance training;Therapeutic activities   OT Goals(Current goals can be found in the care plan section) Acute Rehab OT Goals Patient Stated Goal: sister wishes for patient to be moving easier to decrease her BOC OT Goal Formulation: With patient/family  Time For Goal Achievement:  01/02/13 Potential to Achieve Goals: Good ADL Goals Additional ADL Goal #1: Caregiver will demonstrate proper transfer technique for safety. Additional ADL Goal #2: Pt will sit supervision at EOB prior to ADL for 3 min without LOB  Visit Information  Last OT Received On: 12/19/12 Assistance Needed: +2 PT/OT Co-Evaluation/Treatment: Yes History of Present Illness: 62 y.o. 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 He also has BLE ampuitations. Went to [facility] for insulin treatment (was found to have sugar of over 500 but not DKA) When preparing to leave [facility], he fell face first on the floor. CT of the brain showed no skull fracture or intracranial issue. CT of the maxillofacial bones showed no fracture. CT of the C-spine showed acute C2 body fracture with 4 mm fracture widening and mild posterior bony retropulsion up to the margin with the dens where there is an axillary oriented fracture extending to the anterior body. Fracture extends into the lateral masses bilaterally reaching to the C1-C2 facet joints. Bilateral C1 arch fractures, nondisplaced. Hemorrhage around C1-C2 when combined with mild posterior retropulsion at C2 likely some cord flattening. In aspen collar.       Prior Functioning     Home Living Family/patient expects to be discharged to:: Private residence Living Arrangements: Other relatives (Sister POA) Available Help at Discharge: Family;Available 24 hours/day Type of Home: House Home Access: Stairs to enter [employer] of Steps: 2 Home Layout: One level Home Equipment: Bedside commode;Shower seat;Wheelchair - Engineer, technical sales - power;Hospital bed;Other (comment) (trapeze bar) Additional Comments: Sister has been POA and caregiver for 5 years Prior Function Level of Independence: Needs assistance Gait / Transfers Assistance Needed: does not ambulate, total assist for transfers, needs total assist to sit up, but can hold sitting position if he has  something to hold onto  ADL's / Homemaking Assistance Needed: total dependence Communication / Swallowing Assistance Needed: baseline aphasia Comments: will communicate by pointing to photos of pain level, or words to describe comfort Communication Communication: Expressive difficulties;Other (comment) (non-verbal) Dominant Hand: Left            Cognition  Cognition Arousal/Alertness: Awake/alert Behavior During Therapy: WFL for tasks assessed/performed Overall Cognitive Status: History of cognitive impairments - at baseline    Extremity/Trunk Assessment Upper Extremity Assessment Upper Extremity Assessment: Defer to OT evaluation RUE Deficits / Details: completely flacid from prior stroke, hand is forming contracture in digits, tone in elbow and wrist. 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     Mobility Bed Mobility Bed Mobility: Rolling Right;Right Sidelying to Sit;Sit to Supine Rolling Right: 2: Max assist;With rail Right Sidelying to Sit: 2: Max assist Sit to Supine: 2: Max assist Details for Bed Mobility Assistance: facilitation for all aspects of transfer, limited by weakness and pain Transfers Transfers: Not assessed Details for Transfer Assistance: pt is dependent lift transfer at baseline        Balance Balance Balance Assessed: Yes Static Sitting Balance Static Sitting - Balance Support: Left upper extremity supported Static Sitting - Level of Assistance: 4: Min assist Static Sitting - Comment/# of Minutes: initially needing modA for stability, with verbal cues for midline and patient holding onto the rail he was able to sit EOB with minA for stability (tendency is to fall to the right)   End of Session OT - End  of Session Equipment Utilized During Treatment: Cervical collar Activity  Tolerance: Patient tolerated treatment well Patient left: in bed;with call bell/phone within reach;with family/visitor present Nurse Communication: Mobility status;Precautions  GO     Sherryl Manges 12/19/2012, 9:14 AM

## 2012-12-19 NOTE — ED Notes (Signed)
Attempted to call report to 5N, stated pt will be transferring to a neuro floor as this is an ortho floor. 5N will call bed placement at this time

## 2012-12-19 NOTE — ED Notes (Signed)
Please see previous note.

## 2012-12-19 NOTE — Progress Notes (Signed)
[facility] PROGRESS NOTE  [name] ZOX:[id number] DOB: 1951/05/02 DOA: 12/18/2012 PCP: No primary provider on file.  Assessment/Plan: 1. C2 fracture: - there is fracture displacement along the C2-3 disc with mild bony retropulsion, the retropulsed fragment combined with epidural hematoma results in canal stenosis with mild cord flattening. - Will hold aspirin and heparin given the epidural hematoma. - NSG consult appreciated -continue hard collar  2. DM (diabetes mellitus), type 2, uncontrolled. -continue Lantus, SSI,  3. PCM/Failure to thrive: Add Glucerna. Get dietitian consult.   4. HTN (hypertension)   5.Left ventricular dysfunction: No evidence of congestive heart failure.  -Last echo in September of 2013 showed ejection fraction of 25-30%   6. Leukocytosis -likely reactive. -negative chest x-ray and urinalysis   7. History of 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: Patient's sister reports that he is DO NOT RESUSCITATE and would not want feeding tubes -worsening dysphagia -check swallow eval  8. Decubitus ulcer of sacral region, stage 2. Will order a foam dressing and get a wound care consult. Change position every 2 hours.   9. PVD:, R AKA, L BKA  10. 5. Atrial fibrillation: Not a Coumadin candidate due to falls.   Code Status: DNR Family Communication: d/w sister at bedside Disposition Plan: home tomorrow    Consultants:  Dr.Kritzer with NSG  HPI/Subjective: Coughing with meds  Objective: Filed Vitals:   12/19/12 0200 12/19/12 0309 12/19/12 0530 12/19/12 1025  BP: 165/106 158/106 157/108 163/86  Pulse: 91 117 110 117  Temp:  99.6 F (37.6 C) 98.6 F (37 C) 98.4 F (36.9 C)  TempSrc:  Oral Oral Oral  Resp:  18 18 18   Weight:  48.3 kg (106 lb 7.7 oz)    SpO2: 98% 100% 100% 94%   No intake or output data in the 24 hours ending 12/19/12 1458 Filed Weights   12/19/12 0309  Weight: 48.3 kg (106 lb 7.7 oz)     Exam:   General:  Alert, awake  Cardiovascular:S1S2/RRR  Respiratory: CTAB  Abdomen: soft, NT, BS present  Musculoskeletal:  BKA and AKA  Neuro: R sided weakness  Data Reviewed: Basic Metabolic Panel:  Recent Labs Lab 12/18/12 1516 12/19/12 0900  NA 130*  --   K 3.5  --   CL 89*  --   CO2 23  --   GLUCOSE 561*  --   BUN 16  --   CREATININE 0.75 0.67  CALCIUM 9.3  --    Liver Function Tests: No results found for this basename: AST, ALT, ALKPHOS, BILITOT, PROT, ALBUMIN,  in the last 168 hours  Recent Labs Lab 12/18/12 1516  LIPASE 50   No results found for this basename: AMMONIA,  in the last 168 hours CBC:  Recent Labs Lab 12/18/12 1516 12/19/12 0900  WBC 12.2* 14.1*  NEUTROABS 8.7*  --   HGB 15.2 14.1  HCT 43.5 41.3  MCV 84.3 84.1  PLT 193 171   Cardiac Enzymes: No results found for this basename: CKTOTAL, CKMB, CKMBINDEX, TROPONINI,  in the last 168 hours BNP (last 3 results) No results found for this basename: PROBNP,  in the last 8760 hours CBG:  Recent Labs Lab 12/18/12 2012 12/19/12 0732 12/19/12 1133  GLUCAP 277* 287* 179*    No results found for this or any previous visit (from the past 240 hour(s)).   Studies: Dg Chest 2 View  12/18/2012   *RADIOLOGY REPORT*  Clinical Data: Weakness.  CHEST - 2 VIEW  Comparison: Chest x-ray 10/22/2010.  Findings: Lung volumes are normal.  No acute consolidative airspace disease.  No pleural effusions.  Multiple bullae are noted in the lung apices bilaterally (left greater than right).  No evidence of pulmonary edema.  Heart size is normal.  Mediastinal contours are within normal limits.  IMPRESSION: 1.  No radiographic evidence of acute cardiopulmonary disease. 2.  Mild emphysematous changes in the lung apices bilaterally (left greater than right) again noted.   Original Report Authenticated By: Trudie Reed, M.D.   Ct Head Wo Contrast  12/18/2012   **ADDENDUM** CREATED: 12/18/2012 23:51:02   These results were called by telephone on  12/18/12 at 2355  to Dr Otila Kluver, who verbally acknowledged these results.  **END ADDENDUM** SIGNED BY: Audry Riles  12/18/2012   *RADIOLOGY REPORT*  Clinical Data:  Failure to thrive.  CT HEAD WITHOUT CONTRAST CT MAXILLOFACIAL WITHOUT CONTRAST CT CERVICAL SPINE WITHOUT CONTRAST  Technique:  Multidetector CT imaging of the head, cervical spine, and maxillofacial structures were performed using the standard protocol without intravenous contrast. Multiplanar CT image reconstructions of the cervical spine and maxillofacial structures were also generated.  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 Status post wall up mastoidectomy with soft tissue or material with the mastoid bowl.  No aggressive osteolytic features.  No acute calvarial abnormality.  The orbits in imaged paranasal sinuses are unremarkable.  Encephalomalacia and gliosis involving the majority of the left MCA territory, consistent with remote infarction.  There is global cerebral volume loss and chronic small vessel ischemic changes. Extensive intracranial calcific atherosclerosis.  No evidence of acute infarct, hemorrhage, hydrocephalus, or mass lesion/shift. Diagnostic sensitivity is decreased due to patient motion.  IMPRESSION:  1.  No evidence of acute intracranial disease. 2.  Remote left MCA infarction.  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  There has been a wall up mastoidectomy.  Nonspecific tissue or material within the mastoidectomy pole, without erosive features to confirm cholesteatoma.  Patient motion decreases sensitivity for detecting acute fracture. No acute osseous abnormality within the face detected.  The mandible is located.  There are numerous missing teeth, and numerous caries in the remaining teeth, most notable in the lower left remaining molar where this is endodontal or  peridontal erosions.  Remote left MCA territory infarction.  No acute orbital abnormality.  IMPRESSION:  1.  No acute facial fracture detected.  Sensitivity decreased by patient motion.  CT CERVICAL SPINE  Findings:   Acute C2 body fracture, coronally oriented extending from the C2-3 disc (where there is 4 mm of fracture widening and mild posterior bony retropulsion, up to the margin with the dens, where there is an axially oriented fracture extending the anterior body.  The fracture extends into the lateral masses bilaterally, reaching to the C1-C2 facet joints, without offset.  Bilateral posterior C1 arch fractures, nondisplaced.  High-density epidural material circumferentially at the levels of C1 and C2, consistent with hemorrhage.  When combined with mild posterior retropulsion at C2, there is likely some cord flattening.  Diffuse advanced degenerative disc disease with ankylosis at C4-5, C5-6, C6-7.  Mild irregularity at the medial margins of the bilateral occipital condyles, without definitive fracture.  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  IMPRESSION:  1.  Complex C2 body and lateral mass fracture as described above. There is fracture displacement along the C2-3 disc with mild bony retropulsion.  The retropulsed fragment  combined with epidural hematoma results in canal stenosis with mild cord flattening. 2.  Bilateral C1 posterior arch fractures.  Original Report Authenticated By: Tiburcio Pea   Ct Cervical Spine Wo Contrast  12/18/2012   **ADDENDUM** CREATED: 12/18/2012 23:51:02  These results were called by telephone on  12/18/12 at 2355  to Dr Otila Kluver, who verbally acknowledged these results.  **END ADDENDUM** SIGNED BY: Audry Riles  12/18/2012   *RADIOLOGY REPORT*  Clinical Data:  Failure to thrive.  CT HEAD WITHOUT CONTRAST CT MAXILLOFACIAL WITHOUT CONTRAST CT CERVICAL SPINE WITHOUT CONTRAST  Technique:  Multidetector CT imaging of the head, cervical spine, and maxillofacial structures were  performed using the standard protocol without intravenous contrast. Multiplanar CT image reconstructions of the cervical spine and maxillofacial structures were also generated.  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 Status post wall up mastoidectomy with soft tissue or material with the mastoid bowl.  No aggressive osteolytic features.  No acute calvarial abnormality.  The orbits in imaged paranasal sinuses are unremarkable.  Encephalomalacia and gliosis involving the majority of the left MCA territory, consistent with remote infarction.  There is global cerebral volume loss and chronic small vessel ischemic changes. Extensive intracranial calcific atherosclerosis.  No evidence of acute infarct, hemorrhage, hydrocephalus, or mass lesion/shift. Diagnostic sensitivity is decreased due to patient motion.  IMPRESSION:  1.  No evidence of acute intracranial disease. 2.  Remote left MCA infarction.  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  There has been a wall up mastoidectomy.  Nonspecific tissue or material within the mastoidectomy pole, without erosive features to confirm cholesteatoma.  Patient motion decreases sensitivity for detecting acute fracture. No acute osseous abnormality within the face detected.  The mandible is located.  There are numerous missing teeth, and numerous caries in the remaining teeth, most notable in the lower left remaining molar where this is endodontal or peridontal erosions.  Remote left MCA territory infarction.  No acute orbital abnormality.  IMPRESSION:  1.  No acute facial fracture detected.  Sensitivity decreased by patient motion.  CT CERVICAL SPINE  Findings:   Acute C2 body fracture, coronally oriented extending from the C2-3 disc (where there is 4 mm of fracture widening and mild posterior bony retropulsion, up to the margin with the dens, where there is an  axially oriented fracture extending the anterior body.  The fracture extends into the lateral masses bilaterally, reaching to the C1-C2 facet joints, without offset.  Bilateral posterior C1 arch fractures, nondisplaced.  High-density epidural material circumferentially at the levels of C1 and C2, consistent with hemorrhage.  When combined with mild posterior retropulsion at C2, there is likely some cord flattening.  Diffuse advanced degenerative disc disease with ankylosis at C4-5, C5-6, C6-7.  Mild irregularity at the medial margins of the bilateral occipital condyles, without definitive fracture.  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  IMPRESSION:  1.  Complex C2 body and lateral mass fracture as described above. There is fracture displacement along the C2-3 disc with mild bony retropulsion.  The retropulsed fragment combined with epidural hematoma results in canal stenosis with mild cord flattening. 2.  Bilateral C1 posterior arch fractures.  Original Report Authenticated By: Tiburcio Pea   Ct Maxillofacial Wo Cm  12/18/2012   **ADDENDUM** CREATED: 12/18/2012 23:51:02  These results were called by telephone on  12/18/12 at 2355  to Dr Otila Kluver, who verbally acknowledged these results.  **  END ADDENDUM** SIGNED BY: Audry Riles  12/18/2012   *RADIOLOGY REPORT*  Clinical Data:  Failure to thrive.  CT HEAD WITHOUT CONTRAST CT MAXILLOFACIAL WITHOUT CONTRAST CT CERVICAL SPINE WITHOUT CONTRAST  Technique:  Multidetector CT imaging of the head, cervical spine, and maxillofacial structures were performed using the standard protocol without intravenous contrast. Multiplanar CT image reconstructions of the cervical spine and maxillofacial structures were also generated.  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 Status post wall up mastoidectomy with soft tissue or material with the mastoid bowl.  No aggressive osteolytic features.  No acute calvarial abnormality.   The orbits in imaged paranasal sinuses are unremarkable.  Encephalomalacia and gliosis involving the majority of the left MCA territory, consistent with remote infarction.  There is global cerebral volume loss and chronic small vessel ischemic changes. Extensive intracranial calcific atherosclerosis.  No evidence of acute infarct, hemorrhage, hydrocephalus, or mass lesion/shift. Diagnostic sensitivity is decreased due to patient motion.  IMPRESSION:  1.  No evidence of acute intracranial disease. 2.  Remote left MCA infarction.  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  There has been a wall up mastoidectomy.  Nonspecific tissue or material within the mastoidectomy pole, without erosive features to confirm cholesteatoma.  Patient motion decreases sensitivity for detecting acute fracture. No acute osseous abnormality within the face detected.  The mandible is located.  There are numerous missing teeth, and numerous caries in the remaining teeth, most notable in the lower left remaining molar where this is endodontal or peridontal erosions.  Remote left MCA territory infarction.  No acute orbital abnormality.  IMPRESSION:  1.  No acute facial fracture detected.  Sensitivity decreased by patient motion.  CT CERVICAL SPINE  Findings:   Acute C2 body fracture, coronally oriented extending from the C2-3 disc (where there is 4 mm of fracture widening and mild posterior bony retropulsion, up to the margin with the dens, where there is an axially oriented fracture extending the anterior body.  The fracture extends into the lateral masses bilaterally, reaching to the C1-C2 facet joints, without offset.  Bilateral posterior C1 arch fractures, nondisplaced.  High-density epidural material circumferentially at the levels of C1 and C2, consistent with hemorrhage.  When combined with mild posterior retropulsion at C2, there is likely some cord flattening.  Diffuse advanced  degenerative disc disease with ankylosis at C4-5, C5-6, C6-7.  Mild irregularity at the medial margins of the bilateral occipital condyles, without definitive fracture.  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  IMPRESSION:  1.  Complex C2 body and lateral mass fracture as described above. There is fracture displacement along the C2-3 disc with mild bony retropulsion.  The retropulsed fragment combined with epidural hematoma results in canal stenosis with mild cord flattening. 2.  Bilateral C1 posterior arch fractures.  Original Report Authenticated By: Tiburcio Pea    Scheduled Meds: . antiseptic oral rinse  15 mL Mouth Rinse BID  . digoxin  0.125 mg Oral q morning - 10a  . docusate sodium  100 mg Oral BID  . feeding supplement  237 mL Oral TID BM  . glipiZIDE  5 mg Oral BID AC  . HYDROcodone-acetaminophen  1 tablet Oral Q6H  . insulin aspart  0-20 Units Subcutaneous TID WC  . insulin aspart  0-5 Units Subcutaneous QHS  . insulin glargine  10 Units Subcutaneous q morning - 10a  . lisinopril  20  mg Oral q morning - 10a  . simvastatin  10 mg Oral q1800   Continuous Infusions: . 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 If 7PM-7AM, please contact night-coverage at [email or web address], password [facility] 12/19/2012, 2:58 PM  LOS: 1 day

## 2012-12-19 NOTE — Progress Notes (Signed)
INITIAL NUTRITION ASSESSMENT  DOCUMENTATION CODES Per approved criteria  -Not Applicable   INTERVENTION: 1. Continue Glucerna Shake po TID, each supplement provides 220 kcal and 10 grams of protein.   NUTRITION DIAGNOSIS: Underweight related to poor appetite as evidenced by reported limited meal completion.   Goal: PO intake to meet >/=90% estimated nutrition needs  Monitor:  Po intake, weight trends, labs, I/O's   Reason for Assessment: Consult  62 y.o. male  Admitting Dx: Cervical spine fracture  ASSESSMENT: Pt was brought to the ED for FTT and fell, found to have a C2 fracture.  Pt with hx of CVA with asphasia, BKA and AKA.  Spoke with pt's daughter who states pt is eating well at home, but has some questions about diet. Pt blood sugar elevated on admission. Dtr is not checking blood sugar regularly. Encouraged checking at minimum once daily prior to giving insulin. Dtr has little knowledge of nutrition. States she is trying to get the pt to eat more, but also stated she looks at sodium/sugar/calories for all foods she is providing. Encouraged reducing restrictions to allow pt more food choices. Recommended increased protein foods for weight gain such as peanut butter.  Height: Ht Readings from Last 1 Encounters:  02/13/12 6' (1.829 m)    Weight: Wt Readings from Last 1 Encounters:  12/19/12 106 lb 7.7 oz (48.3 kg)    Ideal Body Weight: 144 lbs Adj for AKA and BKA   % Ideal Body Weight: 74%  Wt Readings from Last 10 Encounters:  12/19/12 106 lb 7.7 oz (48.3 kg)  01/01/12 108 lb 3.9 oz (49.1 kg)  01/01/12 108 lb 3.9 oz (49.1 kg)    Usual Body Weight: unsure, "has always been thin"  % Usual Body Weight: --  BMI:  17.8 kg/(m^2) adj for AKA and BKA Underweight   Estimated Nutritional Needs: Kcal: 1500-1700 Protein: 60-70 gm  Fluid: 1.5-1.7 L   Skin: stage 2 pressure ulcer on sacrum   Diet Order: Carb Control  EDUCATION NEEDS: -No education needs  identified at this time  No intake or output data in the 24 hours ending 12/19/12 0913  Last BM: PTA    Labs:   Recent Labs Lab 12/18/12 1516  NA 130*  K 3.5  CL 89*  CO2 23  BUN 16  CREATININE 0.75  CALCIUM 9.3  GLUCOSE 561*    CBG (last 3)   Recent Labs  12/18/12 2012 12/19/12 0732  GLUCAP 277* 287*    Scheduled Meds: . antiseptic oral rinse  15 mL Mouth Rinse BID  . digoxin  0.125 mg Oral q morning - 10a  . docusate sodium  100 mg Oral BID  . feeding supplement  237 mL Oral TID BM  . glipiZIDE  5 mg Oral BID AC  . HYDROcodone-acetaminophen  1 tablet Oral Q6H  . insulin aspart  0-20 Units Subcutaneous TID WC  . insulin aspart  0-5 Units Subcutaneous QHS  . insulin glargine  10 Units Subcutaneous q morning - 10a  . lisinopril  20 mg Oral q morning - 10a  . simvastatin  10 mg Oral q1800    Continuous Infusions:   Past Medical History  Diagnosis Date  . Active smoker   . Alcohol abuse, episodic   . Difficult intubation   . Hypertension   . Dysrhythmia   . COPD (chronic obstructive pulmonary disease)   . Shortness of breath   . Diabetes mellitus   . Peripheral vascular disease   .  CHF (congestive heart failure)   . Stroke     has expressive aphasia, weak on Rt. side    Past Surgical History  Procedure Laterality Date  . Vascular surgery    . Leg amputation above knee      right  . Leg amputation below knee      left  . Laryngoscopy  12/27/2011    Procedure: LARYNGOSCOPY;  Surgeon: Christia Reading, MD;  Location: [facility] OR;  Service: ENT;  Laterality: N/A;  direct video laryngoscopy with evaluation of upper airway.reintubation.    Clarene Duke RD, LDN Pager [phone] After Hours pager [phone]

## 2012-12-19 NOTE — H&P (Signed)
[facility] History and Physical  [name] WUJ:[id number] DOB: 1951/01/21 DOA: 12/18/2012  Referring physician: Rubin Payor PCP: Dala Dock  Specialists: [place]  Chief Complaint: not eating  HPI: [name] is a 62 y.o. male who is aphasic from previous CVA. He was brought to the [facility] long emergency department by his sister who is his caregiver and healthcare power of attorney. He has not been needing well for the past week or so. She noted no other problems. In the emergency room, he was found to be hyperglycemic with a blood sugar above 500, but not in DKA. He had white blood cell count of 12,000, but you a.m. chest x-ray were negative. His hyperglycemia was treated with IV fluid and insulin. His blood sugars improved and he was found to be discharged by the ED provider. Patient's family member reportedly sat the patient up in bed as the nurse was getting the wheelchair to wheel him out of the emergency room. He reportedly fell face first onto the floor. He suffered a hematoma and laceration of the scalp he was noted to have some blood in his mouth, but has poor dentition and unclear whether he suffered any dental injuries. CT of the brain showed no skull fracture or intracranial issue. CT of the maxillofacial bones showed no fracture. CT of the C-spine showed acute C2 body fracture with 4 mm fracture widening and mild posterior bony retropulsion up to the margin with the dens where there is an axillary oriented fracture extending to the anterior body. Fracture extends into the lateral masses bilaterally reaching to the C1-C2 facet joints. Bilateral C1 arch fractures, nondisplaced. Hemorrhage around C1-C2 when combined with mild posterior retropulsion at C2 likely some cord flattening. The ED provider spoke with Dr. Gerlene Fee who recommended a C-spine collar and discharged home. Dr. Rubin Payor felt that the patient would benefit from inpatient observation due to the severity of the  injury and patient's multiple comorbidities. Therefore, hospitalist was called to admit.  Review of Systems: unable due to patient factors  Past Medical History  Diagnosis Date  . Active smoker   . Alcohol abuse, episodic   . Difficult intubation   . Hypertension   . Dysrhythmia   . COPD (chronic obstructive pulmonary disease)   . Shortness of breath   . Diabetes mellitus   . Peripheral vascular disease   . CHF (congestive heart failure)   . Stroke     has expressive aphasia, weak on Rt. side   Past Surgical History  Procedure Laterality Date  . Vascular surgery    . Leg amputation above knee      right  . Leg amputation below knee      left  . Laryngoscopy  12/27/2011    Procedure: LARYNGOSCOPY;  Surgeon: Christia Reading, MD;  Location: [facility] OR;  Service: ENT;  Laterality: N/A;  direct video laryngoscopy with evaluation of upper airway.reintubation.   Social History: Previous stroke alcohol and cigarette use. Currently cared for by patient's sister. CODE STATUS DO NOT RESUSCITATE  No Known Allergies  Family history, unable due to patient factors.  Prior to Admission medications   Medication Sig Start Date End Date Taking? Authorizing Provider  aspirin EC 81 MG tablet Take 81 mg by mouth every morning.   Yes Historical Provider, MD  digoxin (LANOXIN) 0.125 MG tablet Take 0.125 mg by mouth every morning.   Yes Historical Provider, MD  glipiZIDE (GLUCOTROL) 5 MG tablet Take 5 mg by mouth 2 (two) times daily  before a meal.   Yes Historical Provider, MD  HYDROcodone-acetaminophen (NORCO/VICODIN) 5-325 MG per tablet Take 1 tablet by mouth every 6 (six) hours as needed for pain.   Yes Historical Provider, MD  insulin glargine (LANTUS) 100 UNIT/ML injection Inject 5 Units into the skin every morning.   Yes Historical Provider, MD  lisinopril (PRINIVIL,ZESTRIL) 20 MG tablet Take 20 mg by mouth every morning.   Yes Historical Provider, MD  pravastatin (PRAVACHOL) 20 MG tablet Take 20 mg  by mouth every morning.   Yes Historical Provider, MD   Physical Exam: Filed Vitals:   12/18/12 2045 12/18/12 2100 12/18/12 2115 12/18/12 2252  BP:  163/89  184/123  Pulse:   84 94  Temp:    98.5 F (36.9 C)  TempSrc:    Oral  Resp: 19 16 21    SpO2:   97% 98%   BP 184/123  Pulse 94  Temp(Src) 98.5 F (36.9 C) (Oral)  Resp 21  SpO2 98%  General Appearance:    alert. Chronically ill appearing. Able to communicate via hand gestures only, seems for the most part appropriate.   Head:    frontal scalp with a hematoma in the midline and a laceration which has been sutured per   Eyes:    PERRL, conjunctiva injected.   extraocular movements intact          Nose:   Nares normal, septum midline, mucosa normal, no drainage   or sinus tenderness  Throat:   dried blood noted at the right side of the mouth. Poor dentition with multiple caries   Neck:   immobilized in a hard collar.  Back:     Symmetric, no curvature, ROM normal, no CVA tenderness  Lungs:     Clear to auscultation bilaterally, respirations unlabored  Chest wall:    No tenderness or deformity  Heart:    irregularly irregular without murmurs gallops rub   Abdomen:     Soft, non-tender, bowel sounds active. scaphoid.   Genitalia:    deferred   Rectal:    deferred  Extremities:   Extremities normal, atraumatic, no cyanosis or edema  Pulses:   2+ and symmetric all extremities  Skin:   sacrum with about a 1 cm stage II decubitus   Lymph nodes:   Cervical, supraclavicular, and axillary nodes normal  Neurologic:   right side with chronic weakness. Left hand and leg strength appears normal. Patient is able to follow commands and communicate with his left hand. He is aphasic.    Labs on Admission:  Basic Metabolic Panel:  Recent Labs Lab 12/18/12 1516  NA 130*  K 3.5  CL 89*  CO2 23  GLUCOSE 561*  BUN 16  CREATININE 0.75  CALCIUM 9.3   Liver Function Tests: No results found for this basename: AST, ALT, ALKPHOS,  BILITOT, PROT, ALBUMIN,  in the last 168 hours  Recent Labs Lab 12/18/12 1516  LIPASE 50   No results found for this basename: AMMONIA,  in the last 168 hours CBC:  Recent Labs Lab 12/18/12 1516  WBC 12.2*  NEUTROABS 8.7*  HGB 15.2  HCT 43.5  MCV 84.3  PLT 193   Cardiac Enzymes: No results found for this basename: CKTOTAL, CKMB, CKMBINDEX, TROPONINI,  in the last 168 hours  BNP (last 3 results) No results found for this basename: PROBNP,  in the last 8760 hours CBG:  Recent Labs Lab 12/18/12 2012  GLUCAP 277*    Radiological Exams on  Admission: Dg Chest 2 View  12/18/2012   *RADIOLOGY REPORT*  Clinical Data: Weakness.  CHEST - 2 VIEW  Comparison: Chest x-ray 10/22/2010.  Findings: Lung volumes are normal.  No acute consolidative airspace disease.  No pleural effusions.  Multiple bullae are noted in the lung apices bilaterally (left greater than right).  No evidence of pulmonary edema.  Heart size is normal.  Mediastinal contours are within normal limits.  IMPRESSION: 1.  No radiographic evidence of acute cardiopulmonary disease. 2.  Mild emphysematous changes in the lung apices bilaterally (left greater than right) again noted.   Original Report Authenticated By: Trudie Reed, M.D.   Ct Head Wo Contrast  12/18/2012   **ADDENDUM** CREATED: 12/18/2012 23:51:02  These results were called by telephone on  12/18/12 at 2355  to Dr Otila Kluver, who verbally acknowledged these results.  **END ADDENDUM** SIGNED BY: Audry Riles  12/18/2012   *RADIOLOGY REPORT*  Clinical Data:  Failure to thrive.  CT HEAD WITHOUT CONTRAST CT MAXILLOFACIAL WITHOUT CONTRAST CT CERVICAL SPINE WITHOUT CONTRAST  Technique:  Multidetector CT imaging of the head, cervical spine, and maxillofacial structures were performed using the standard protocol without intravenous contrast. Multiplanar CT image reconstructions of the cervical spine and maxillofacial structures were also generated.  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 Status post wall up mastoidectomy with soft tissue or material with the mastoid bowl.  No aggressive osteolytic features.  No acute calvarial abnormality.  The orbits in imaged paranasal sinuses are unremarkable.  Encephalomalacia and gliosis involving the majority of the left MCA territory, consistent with remote infarction.  There is global cerebral volume loss and chronic small vessel ischemic changes. Extensive intracranial calcific atherosclerosis.  No evidence of acute infarct, hemorrhage, hydrocephalus, or mass lesion/shift. Diagnostic sensitivity is decreased due to patient motion.  IMPRESSION:  1.  No evidence of acute intracranial disease. 2.  Remote left MCA infarction.  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  There has been a wall up mastoidectomy.  Nonspecific tissue or material within the mastoidectomy pole, without erosive features to confirm cholesteatoma.  Patient motion decreases sensitivity for detecting acute fracture. No acute osseous abnormality within the face detected.  The mandible is located.  There are numerous missing teeth, and numerous caries in the remaining teeth, most notable in the lower left remaining molar where this is endodontal or peridontal erosions.  Remote left MCA territory infarction.  No acute orbital abnormality.  IMPRESSION:  1.  No acute facial fracture detected.  Sensitivity decreased by patient motion.  CT CERVICAL SPINE  Findings:   Acute C2 body fracture, coronally oriented extending from the C2-3 disc (where there is 4 mm of fracture widening and mild posterior bony retropulsion, up to the margin with the dens, where there is an axially oriented fracture extending the anterior body.  The fracture extends into the lateral masses bilaterally, reaching to the C1-C2 facet joints, without offset.  Bilateral posterior C1 arch fractures,  nondisplaced.  High-density epidural material circumferentially at the levels of C1 and C2, consistent with hemorrhage.  When combined with mild posterior retropulsion at C2, there is likely some cord flattening.  Diffuse advanced degenerative disc disease with ankylosis at C4-5, C5-6, C6-7.  Mild irregularity at the medial margins of the bilateral occipital condyles, without definitive fracture.  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  IMPRESSION:  1.  Complex C2 body and lateral  mass fracture as described above. There is fracture displacement along the C2-3 disc with mild bony retropulsion.  The retropulsed fragment combined with epidural hematoma results in canal stenosis with mild cord flattening. 2.  Bilateral C1 posterior arch fractures.  Original Report Authenticated By: Tiburcio Pea   Ct Cervical Spine Wo Contrast  12/18/2012   **ADDENDUM** CREATED: 12/18/2012 23:51:02  These results were called by telephone on  12/18/12 at 2355  to Dr Otila Kluver, who verbally acknowledged these results.  **END ADDENDUM** SIGNED BY: Audry Riles  12/18/2012   *RADIOLOGY REPORT*  Clinical Data:  Failure to thrive.  CT HEAD WITHOUT CONTRAST CT MAXILLOFACIAL WITHOUT CONTRAST CT CERVICAL SPINE WITHOUT CONTRAST  Technique:  Multidetector CT imaging of the head, cervical spine, and maxillofacial structures were performed using the standard protocol without intravenous contrast. Multiplanar CT image reconstructions of the cervical spine and maxillofacial structures were also generated.  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 Status post wall up mastoidectomy with soft tissue or material with the mastoid bowl.  No aggressive osteolytic features.  No acute calvarial abnormality.  The orbits in imaged paranasal sinuses are unremarkable.  Encephalomalacia and gliosis involving the majority of the left MCA territory, consistent with remote infarction.  There is global cerebral  volume loss and chronic small vessel ischemic changes. Extensive intracranial calcific atherosclerosis.  No evidence of acute infarct, hemorrhage, hydrocephalus, or mass lesion/shift. Diagnostic sensitivity is decreased due to patient motion.  IMPRESSION:  1.  No evidence of acute intracranial disease. 2.  Remote left MCA infarction.  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  There has been a wall up mastoidectomy.  Nonspecific tissue or material within the mastoidectomy pole, without erosive features to confirm cholesteatoma.  Patient motion decreases sensitivity for detecting acute fracture. No acute osseous abnormality within the face detected.  The mandible is located.  There are numerous missing teeth, and numerous caries in the remaining teeth, most notable in the lower left remaining molar where this is endodontal or peridontal erosions.  Remote left MCA territory infarction.  No acute orbital abnormality.  IMPRESSION:  1.  No acute facial fracture detected.  Sensitivity decreased by patient motion.  CT CERVICAL SPINE  Findings:   Acute C2 body fracture, coronally oriented extending from the C2-3 disc (where there is 4 mm of fracture widening and mild posterior bony retropulsion, up to the margin with the dens, where there is an axially oriented fracture extending the anterior body.  The fracture extends into the lateral masses bilaterally, reaching to the C1-C2 facet joints, without offset.  Bilateral posterior C1 arch fractures, nondisplaced.  High-density epidural material circumferentially at the levels of C1 and C2, consistent with hemorrhage.  When combined with mild posterior retropulsion at C2, there is likely some cord flattening.  Diffuse advanced degenerative disc disease with ankylosis at C4-5, C5-6, C6-7.  Mild irregularity at the medial margins of the bilateral occipital condyles, without definitive fracture.  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   IMPRESSION:  1.  Complex C2 body and lateral mass fracture as described above. There is fracture displacement along the C2-3 disc with mild bony retropulsion.  The retropulsed fragment combined with epidural hematoma results in canal stenosis with mild cord flattening. 2.  Bilateral C1 posterior arch fractures.  Original Report Authenticated By: Tiburcio Pea   Ct Maxillofacial Wo Cm  12/18/2012   **ADDENDUM** CREATED: 12/18/2012 23:51:02  These results were called by telephone on  12/18/12 at 2355  to Dr Otila Kluver, who verbally acknowledged these results.  **END ADDENDUM** SIGNED BY: Audry Riles  12/18/2012   *RADIOLOGY REPORT*  Clinical Data:  Failure to thrive.  CT HEAD WITHOUT CONTRAST CT MAXILLOFACIAL WITHOUT CONTRAST CT CERVICAL SPINE WITHOUT CONTRAST  Technique:  Multidetector CT imaging of the head, cervical spine, and maxillofacial structures were performed using the standard protocol without intravenous contrast. Multiplanar CT image reconstructions of the cervical spine and maxillofacial structures were also generated.  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 Status post wall up mastoidectomy with soft tissue or material with the mastoid bowl.  No aggressive osteolytic features.  No acute calvarial abnormality.  The orbits in imaged paranasal sinuses are unremarkable.  Encephalomalacia and gliosis involving the majority of the left MCA territory, consistent with remote infarction.  There is global cerebral volume loss and chronic small vessel ischemic changes. Extensive intracranial calcific atherosclerosis.  No evidence of acute infarct, hemorrhage, hydrocephalus, or mass lesion/shift. Diagnostic sensitivity is decreased due to patient motion.  IMPRESSION:  1.  No evidence of acute intracranial disease. 2.  Remote left MCA infarction.  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  There  has been a wall up mastoidectomy.  Nonspecific tissue or material within the mastoidectomy pole, without erosive features to confirm cholesteatoma.  Patient motion decreases sensitivity for detecting acute fracture. No acute osseous abnormality within the face detected.  The mandible is located.  There are numerous missing teeth, and numerous caries in the remaining teeth, most notable in the lower left remaining molar where this is endodontal or peridontal erosions.  Remote left MCA territory infarction.  No acute orbital abnormality.  IMPRESSION:  1.  No acute facial fracture detected.  Sensitivity decreased by patient motion.  CT CERVICAL SPINE  Findings:   Acute C2 body fracture, coronally oriented extending from the C2-3 disc (where there is 4 mm of fracture widening and mild posterior bony retropulsion, up to the margin with the dens, where there is an axially oriented fracture extending the anterior body.  The fracture extends into the lateral masses bilaterally, reaching to the C1-C2 facet joints, without offset.  Bilateral posterior C1 arch fractures, nondisplaced.  High-density epidural material circumferentially at the levels of C1 and C2, consistent with hemorrhage.  When combined with mild posterior retropulsion at C2, there is likely some cord flattening.  Diffuse advanced degenerative disc disease with ankylosis at C4-5, C5-6, C6-7.  Mild irregularity at the medial margins of the bilateral occipital condyles, without definitive fracture.  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  IMPRESSION:  1.  Complex C2 body and lateral mass fracture as described above. There is fracture displacement along the C2-3 disc with mild bony retropulsion.  The retropulsed fragment combined with epidural hematoma results in canal stenosis with mild cord flattening. 2.  Bilateral C1 posterior arch fractures.  Original Report Authenticated By: Tiburcio Pea   EKG: ATRIAL FIBRILLATION, V-RATE  LEFT VENTRICULAR HY PERTROPHY with  strain  Assessment/Plan  Complex C2 body and lateral mass fracture. There is fracture displacement along the C2-3 disc with mild bony retropulsion.  The retropulsed fragment combined with epidural hematoma results in canal stenosis with mild cord flattening. 2.  Bilateral C1 posterior arch fractures.   I have agreed to accept this patient to the hospitalist service on the condition that Dr. Gerlene Fee  will officially consult. The PA will notify Dr. Gerlene Fee of patient's admission to [facility] cone. Will transfer to [facility] neuro unit. Will hold aspirin and heparin given the epidural hematoma. Sequential compression devices only. C-spine immobilization. Physical therapy occupational therapy consultations. May benefit from skilled nursing facility, but patient's sister has refused in the past. Pain medications and stool softeners will be ordered.     DM (diabetes mellitus), type 2, uncontrolled.Will increase Lantus in the morning. Give sliding scale. Check hemoglobin A1c and adjust medications.   Failure to thrive: Add Glucerna. Get dietitian consult. No evidence of infection    HTN (hypertension)    Atrial fibrillation: Not a Coumadin candidate due to falls.    Left ventricular dysfunction: No evidence of congestive heart failure. 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 Will order a foam dressing and get a wound care consult. Asked nursing staff to turn every 2 hours. Patient's sister is very concerned about worsening pressure sores    Scalp laceration    Unilateral AKA, right    Unilateral complete BKA, left    Protein calorie malnutrition  Code Status: DNR Family Communication: POA, sister [name] Disposition Plan: home  With home health v. SNF  Time spent: 60 minutes  Christiane Ha [facility] Pager [phone]  If 7PM-7AM, please contact night-coverage [email or web address] Password [email or web address] 12/19/2012, 1:25 AM

## 2012-12-19 NOTE — Consult Note (Signed)
Reason for Consult:Cervical fracture Referring Physician: Hospitalist  [name] is an 62 y.o. male.  HPI: 62 yo male s/p cva with complete aphasia, bilteral le amputee, and bad DM. Was in [place] ed for failure to thrive issues. Fel;l while in ed, and suffered comples C2 fracture. Asdmitted by hospital service, and neurosurgical consult requested.  Past Medical History  Diagnosis Date  . Active smoker   . Alcohol abuse, episodic   . Difficult intubation   . Hypertension   . Dysrhythmia   . COPD (chronic obstructive pulmonary disease)   . Shortness of breath   . Diabetes mellitus   . Peripheral vascular disease   . CHF (congestive heart failure)   . Stroke     has expressive aphasia, weak on Rt. side    Past Surgical History  Procedure Laterality Date  . Vascular surgery    . Leg amputation above knee      right  . Leg amputation below knee      left  . Laryngoscopy  12/27/2011    Procedure: LARYNGOSCOPY;  Surgeon: Christia Reading, MD;  Location: [facility] OR;  Service: ENT;  Laterality: N/A;  direct video laryngoscopy with evaluation of upper airway.reintubation.    History reviewed. No pertinent family history.  Social History:  reports that he has quit smoking. His smoking use included Cigarettes. He has a 30 pack-year smoking history. He has never used smokeless tobacco. He reports that  drinks alcohol. He reports that he does not use illicit drugs.  Allergies: No Known Allergies  Medications: I have reviewed the patient's current medications.  Results for orders placed during the hospital encounter of 12/18/12 (from the past 48 hour(s))  BASIC METABOLIC PANEL     Status: Abnormal   Collection Time    12/18/12  3:16 PM      Result Value Range   Sodium 130 (*) 135 - 145 mEq/L   Potassium 3.5  3.5 - 5.1 mEq/L   Chloride 89 (*) 96 - 112 mEq/L   CO2 23  19 - 32 mEq/L   Glucose, Bld 561 (*) 70 - 99 mg/dL   Comment: CRITICAL RESULT CALLED TO, READ BACK BY AND  VERIFIED WITH:     WATERS,T. RN AT 1610 12/18/12 BY WELLS,D.   BUN 16  6 - 23 mg/dL   Creatinine, Ser 9.60  0.50 - 1.35 mg/dL   Calcium 9.3  8.4 - 45.4 mg/dL   GFR calc non Af Amer >90  >90 mL/min   GFR calc Af Amer >90  >90 mL/min   Comment:            The eGFR has been calculated     using the CKD EPI equation.     This calculation has not been     validated in all clinical     situations.     eGFR's persistently     <90 mL/min signify     possible Chronic Kidney Disease.  LIPASE, BLOOD     Status: None   Collection Time    12/18/12  3:16 PM      Result Value Range   Lipase 50  11 - 59 U/L  CBC WITH DIFFERENTIAL     Status: Abnormal   Collection Time    12/18/12  3:16 PM      Result Value Range   WBC 12.2 (*) 4.0 - 10.5 K/uL   RBC 5.16  4.22 - 5.81 MIL/uL   Hemoglobin 15.2  13.0 - 17.0 g/dL   HCT 16.1  09.6 - 04.5 %   MCV 84.3  78.0 - 100.0 fL   MCH 29.5  26.0 - 34.0 pg   MCHC 34.9  30.0 - 36.0 g/dL   RDW 40.9  81.1 - 91.4 %   Platelets 193  150 - 400 K/uL   Neutrophils Relative % 71  43 - 77 %   Neutro Abs 8.7 (*) 1.7 - 7.7 K/uL   Lymphocytes Relative 19  12 - 46 %   Lymphs Abs 2.3  0.7 - 4.0 K/uL   Monocytes Relative 8  3 - 12 %   Monocytes Absolute 1.0  0.1 - 1.0 K/uL   Eosinophils Relative 1  0 - 5 %   Eosinophils Absolute 0.2  0.0 - 0.7 K/uL   Basophils Relative 0  0 - 1 %   Basophils Absolute 0.1  0.0 - 0.1 K/uL  URINALYSIS, ROUTINE W REFLEX MICROSCOPIC     Status: Abnormal   Collection Time    12/18/12  5:48 PM      Result Value Range   Color, Urine YELLOW  YELLOW   APPearance CLEAR  CLEAR   Specific Gravity, Urine 1.037 (*) 1.005 - 1.030   pH 5.5  5.0 - 8.0   Glucose, UA >1000 (*) NEGATIVE mg/dL   Hgb urine dipstick TRACE (*) NEGATIVE   Bilirubin Urine NEGATIVE  NEGATIVE   Ketones, ur 15 (*) NEGATIVE mg/dL   Protein, ur 782 (*) NEGATIVE mg/dL   Urobilinogen, UA 2.0 (*) 0.0 - 1.0 mg/dL   Nitrite NEGATIVE  NEGATIVE   Leukocytes, UA NEGATIVE  NEGATIVE   URINE MICROSCOPIC-ADD ON     Status: Abnormal   Collection Time    12/18/12  5:48 PM      Result Value Range   Squamous Epithelial / LPF RARE  RARE   RBC / HPF 0-2  <3 RBC/hpf   Bacteria, UA FEW (*) RARE  GLUCOSE, CAPILLARY     Status: Abnormal   Collection Time    12/18/12  8:12 PM      Result Value Range   Glucose-Capillary 277 (*) 70 - 99 mg/dL  DIGOXIN LEVEL     Status: Abnormal   Collection Time    12/19/12 12:20 AM      Result Value Range   Digoxin Level <0.3 (*) 0.8 - 2.0 ng/mL  GLUCOSE, CAPILLARY     Status: Abnormal   Collection Time    12/19/12  7:32 AM      Result Value Range   Glucose-Capillary 287 (*) 70 - 99 mg/dL    Dg Chest 2 View  9/56/2130   *RADIOLOGY REPORT*  Clinical Data: Weakness.  CHEST - 2 VIEW  Comparison: Chest x-ray 10/22/2010.  Findings: Lung volumes are normal.  No acute consolidative airspace disease.  No pleural effusions.  Multiple bullae are noted in the lung apices bilaterally (left greater than right).  No evidence of pulmonary edema.  Heart size is normal.  Mediastinal contours are within normal limits.  IMPRESSION: 1.  No radiographic evidence of acute cardiopulmonary disease. 2.  Mild emphysematous changes in the lung apices bilaterally (left greater than right) again noted.   Original Report Authenticated By: Trudie Reed, M.D.   Ct Head Wo Contrast  12/18/2012   **ADDENDUM** CREATED: 12/18/2012 23:51:02  These results were called by telephone on  12/18/12 at 2355  to Dr Otila Kluver, who verbally acknowledged these results.  **END ADDENDUM** SIGNED BY:  Audry Riles  12/18/2012   *RADIOLOGY REPORT*  Clinical Data:  Failure to thrive.  CT HEAD WITHOUT CONTRAST CT MAXILLOFACIAL WITHOUT CONTRAST CT CERVICAL SPINE WITHOUT CONTRAST  Technique:  Multidetector CT imaging of the head, cervical spine, and maxillofacial structures were performed using the standard protocol without intravenous contrast. Multiplanar CT image reconstructions of the cervical  spine and maxillofacial structures were also generated.  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 Status post wall up mastoidectomy with soft tissue or material with the mastoid bowl.  No aggressive osteolytic features.  No acute calvarial abnormality.  The orbits in imaged paranasal sinuses are unremarkable.  Encephalomalacia and gliosis involving the majority of the left MCA territory, consistent with remote infarction.  There is global cerebral volume loss and chronic small vessel ischemic changes. Extensive intracranial calcific atherosclerosis.  No evidence of acute infarct, hemorrhage, hydrocephalus, or mass lesion/shift. Diagnostic sensitivity is decreased due to patient motion.  IMPRESSION:  1.  No evidence of acute intracranial disease. 2.  Remote left MCA infarction.  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  There has been a wall up mastoidectomy.  Nonspecific tissue or material within the mastoidectomy pole, without erosive features to confirm cholesteatoma.  Patient motion decreases sensitivity for detecting acute fracture. No acute osseous abnormality within the face detected.  The mandible is located.  There are numerous missing teeth, and numerous caries in the remaining teeth, most notable in the lower left remaining molar where this is endodontal or peridontal erosions.  Remote left MCA territory infarction.  No acute orbital abnormality.  IMPRESSION:  1.  No acute facial fracture detected.  Sensitivity decreased by patient motion.  CT CERVICAL SPINE  Findings:   Acute C2 body fracture, coronally oriented extending from the C2-3 disc (where there is 4 mm of fracture widening and mild posterior bony retropulsion, up to the margin with the dens, where there is an axially oriented fracture extending the anterior body.  The fracture extends into the lateral masses bilaterally, reaching to  the C1-C2 facet joints, without offset.  Bilateral posterior C1 arch fractures, nondisplaced.  High-density epidural material circumferentially at the levels of C1 and C2, consistent with hemorrhage.  When combined with mild posterior retropulsion at C2, there is likely some cord flattening.  Diffuse advanced degenerative disc disease with ankylosis at C4-5, C5-6, C6-7.  Mild irregularity at the medial margins of the bilateral occipital condyles, without definitive fracture.  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  IMPRESSION:  1.  Complex C2 body and lateral mass fracture as described above. There is fracture displacement along the C2-3 disc with mild bony retropulsion.  The retropulsed fragment combined with epidural hematoma results in canal stenosis with mild cord flattening. 2.  Bilateral C1 posterior arch fractures.  Original Report Authenticated By: Tiburcio Pea   Ct Cervical Spine Wo Contrast  12/18/2012   **ADDENDUM** CREATED: 12/18/2012 23:51:02  These results were called by telephone on  12/18/12 at 2355  to Dr Otila Kluver, who verbally acknowledged these results.  **END ADDENDUM** SIGNED BY: Audry Riles  12/18/2012   *RADIOLOGY REPORT*  Clinical Data:  Failure to thrive.  CT HEAD WITHOUT CONTRAST CT MAXILLOFACIAL WITHOUT CONTRAST CT CERVICAL SPINE WITHOUT CONTRAST  Technique:  Multidetector CT imaging of the head, cervical spine, and maxillofacial structures were performed using the standard protocol without intravenous contrast. Multiplanar CT image reconstructions of the cervical spine  and maxillofacial structures were also generated.  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 Status post wall up mastoidectomy with soft tissue or material with the mastoid bowl.  No aggressive osteolytic features.  No acute calvarial abnormality.  The orbits in imaged paranasal sinuses are unremarkable.  Encephalomalacia and gliosis involving the majority of the  left MCA territory, consistent with remote infarction.  There is global cerebral volume loss and chronic small vessel ischemic changes. Extensive intracranial calcific atherosclerosis.  No evidence of acute infarct, hemorrhage, hydrocephalus, or mass lesion/shift. Diagnostic sensitivity is decreased due to patient motion.  IMPRESSION:  1.  No evidence of acute intracranial disease. 2.  Remote left MCA infarction.  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  There has been a wall up mastoidectomy.  Nonspecific tissue or material within the mastoidectomy pole, without erosive features to confirm cholesteatoma.  Patient motion decreases sensitivity for detecting acute fracture. No acute osseous abnormality within the face detected.  The mandible is located.  There are numerous missing teeth, and numerous caries in the remaining teeth, most notable in the lower left remaining molar where this is endodontal or peridontal erosions.  Remote left MCA territory infarction.  No acute orbital abnormality.  IMPRESSION:  1.  No acute facial fracture detected.  Sensitivity decreased by patient motion.  CT CERVICAL SPINE  Findings:   Acute C2 body fracture, coronally oriented extending from the C2-3 disc (where there is 4 mm of fracture widening and mild posterior bony retropulsion, up to the margin with the dens, where there is an axially oriented fracture extending the anterior body.  The fracture extends into the lateral masses bilaterally, reaching to the C1-C2 facet joints, without offset.  Bilateral posterior C1 arch fractures, nondisplaced.  High-density epidural material circumferentially at the levels of C1 and C2, consistent with hemorrhage.  When combined with mild posterior retropulsion at C2, there is likely some cord flattening.  Diffuse advanced degenerative disc disease with ankylosis at C4-5, C5-6, C6-7.  Mild irregularity at the medial margins of the bilateral  occipital condyles, without definitive fracture.  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  IMPRESSION:  1.  Complex C2 body and lateral mass fracture as described above. There is fracture displacement along the C2-3 disc with mild bony retropulsion.  The retropulsed fragment combined with epidural hematoma results in canal stenosis with mild cord flattening. 2.  Bilateral C1 posterior arch fractures.  Original Report Authenticated By: Tiburcio Pea   Ct Maxillofacial Wo Cm  12/18/2012   **ADDENDUM** CREATED: 12/18/2012 23:51:02  These results were called by telephone on  12/18/12 at 2355  to Dr Otila Kluver, who verbally acknowledged these results.  **END ADDENDUM** SIGNED BY: Audry Riles  12/18/2012   *RADIOLOGY REPORT*  Clinical Data:  Failure to thrive.  CT HEAD WITHOUT CONTRAST CT MAXILLOFACIAL WITHOUT CONTRAST CT CERVICAL SPINE WITHOUT CONTRAST  Technique:  Multidetector CT imaging of the head, cervical spine, and maxillofacial structures were performed using the standard protocol without intravenous contrast. Multiplanar CT image reconstructions of the cervical spine and maxillofacial structures were also generated.  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 Status post wall up mastoidectomy with soft tissue or material with the mastoid bowl.  No aggressive osteolytic features.  No acute calvarial abnormality.  The orbits in imaged paranasal sinuses are unremarkable.  Encephalomalacia and gliosis involving  the majority of the left MCA territory, consistent with remote infarction.  There is global cerebral volume loss and chronic small vessel ischemic changes. Extensive intracranial calcific atherosclerosis.  No evidence of acute infarct, hemorrhage, hydrocephalus, or mass lesion/shift. Diagnostic sensitivity is decreased due to patient motion.  IMPRESSION:  1.  No evidence of acute intracranial disease. 2.  Remote left MCA infarction.  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  There has been a wall up mastoidectomy.  Nonspecific tissue or material within the mastoidectomy pole, without erosive features to confirm cholesteatoma.  Patient motion decreases sensitivity for detecting acute fracture. No acute osseous abnormality within the face detected.  The mandible is located.  There are numerous missing teeth, and numerous caries in the remaining teeth, most notable in the lower left remaining molar where this is endodontal or peridontal erosions.  Remote left MCA territory infarction.  No acute orbital abnormality.  IMPRESSION:  1.  No acute facial fracture detected.  Sensitivity decreased by patient motion.  CT CERVICAL SPINE  Findings:   Acute C2 body fracture, coronally oriented extending from the C2-3 disc (where there is 4 mm of fracture widening and mild posterior bony retropulsion, up to the margin with the dens, where there is an axially oriented fracture extending the anterior body.  The fracture extends into the lateral masses bilaterally, reaching to the C1-C2 facet joints, without offset.  Bilateral posterior C1 arch fractures, nondisplaced.  High-density epidural material circumferentially at the levels of C1 and C2, consistent with hemorrhage.  When combined with mild posterior retropulsion at C2, there is likely some cord flattening.  Diffuse advanced degenerative disc disease with ankylosis at C4-5, C5-6, C6-7.  Mild irregularity at the medial margins of the bilateral occipital condyles, without definitive fracture.  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  IMPRESSION:  1.  Complex C2 body and lateral mass fracture as described above. There is fracture displacement along the C2-3 disc with mild bony retropulsion.  The retropulsed fragment combined with epidural hematoma results in canal stenosis with mild cord flattening. 2.  Bilateral C1 posterior arch fractures.  Original Report Authenticated By:  Tiburcio Pea    Review of systems not obtained due to patient factors. Blood pressure 157/108, pulse 110, temperature 98.6 F (37 C), temperature source Oral, resp. rate 18, weight 48.3 kg (106 lb 7.7 oz), SpO2 100.00%. the patient is awake, alert. Completely non verbal. No movement on right side. Does move left side and follow commands there. has bilateral le amoputation.   Assessment/Plan: Films reviewed. Complex type 2 odontoid fracture. This should hopefully heal with time. He will need to wear his collar all the time except for shower for the next 3 months. I will follow him in the office starting in 3 weeks. Films and history reviewed with my associate Dr Danielle Dess who agrees with the plan.  Reinaldo Meeker, MD 12/19/2012, 9:01 AM

## 2012-12-20 ENCOUNTER — Observation Stay (HOSPITAL_COMMUNITY): Payer: PRIVATE HEALTH INSURANCE

## 2012-12-20 DIAGNOSIS — D72829 Elevated white blood cell count, unspecified: Secondary | ICD-10-CM

## 2012-12-20 DIAGNOSIS — R1310 Dysphagia, unspecified: Secondary | ICD-10-CM | POA: Diagnosis present

## 2012-12-20 LAB — GLUCOSE, CAPILLARY: Glucose-Capillary: 277 mg/dL — ABNORMAL HIGH (ref 70–99)

## 2012-12-20 NOTE — Progress Notes (Signed)
Talked to patient's sister/ caregiver Olegario Messier again about DCP; she wants to take that patient home with home health care as previously discussed. Olegario Messier voiced concerns about why hospice was brought up by the Attending MD; CM informed Olegario Messier that the Attending MD is obligated to discuss various options that the patient might need due to co morbidities / prognosis/ cervical neck fracture/ dysphagia and code status..... that the patient might need if not now in the future. Olegario Messier feels strongly against any hospice service and CM informed her that her decision is fine. She is providing private care services and home health care has been arranged. All questions answered; b Shelba Flake

## 2012-12-20 NOTE — Progress Notes (Signed)
CM CONSULT Talked to patient's sister Olegario Messier [phone]) about DCP; Olegario Messier is the primary caretaker for the patient and his private sitter and stated that the patient will go home at discharge; Olegario Messier states that he does not need any DME, "he has everything, wheelchair, [facility]." Home health care choices offered and Olegario Messier chose [facility]; Mary with [facility] called for arrangements. Attending MD if in agreement with home health care service please order at discharge HHRN/PT; Olegario Messier will be in after 12:30 today, CM will talk to her again; B Ave Filter RN,BSN,MHA

## 2012-12-20 NOTE — Progress Notes (Signed)
Attending MD, please order HHRN/ PT/OT at discharge; patient's sister requested [facility] and they have been notified; B Shelba Flake

## 2012-12-20 NOTE — Procedures (Signed)
Objective Swallowing Evaluation: Modified Barium Swallowing Study  Patient Details  Name: [name] MRN: [id number] Date of Birth: 26-Mar-1951  Today's Date: 12/20/2012 Time: 1100-1130 SLP Time Calculation (min): 30 min  Past Medical History:  Past Medical History  Diagnosis Date  . Active smoker   . Alcohol abuse, episodic   . Difficult intubation   . Hypertension   . Dysrhythmia   . COPD (chronic obstructive pulmonary disease)   . Shortness of breath   . Diabetes mellitus   . Peripheral vascular disease   . CHF (congestive heart failure)   . 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 Vascular surgery    . Leg amputation above knee      right  . Leg amputation below knee      left  . Laryngoscopy  12/27/2011    Procedure: LARYNGOSCOPY;  Surgeon: Christia Reading, MD;  Location: [facility] OR;  Service: ENT;  Laterality: N/A;  direct video laryngoscopy with evaluation of upper airway.reintubation.   HPI:  62 year old male admitted 12/18/12. Pt seen at [facility] ED due to failure to thrive, and poor po intake, but fell just prior to leaving, sustaining cervical fx.  Pt with PMH COPD, and CVA with expressive aphasia. Now with Aspen collar in place.  BSE completed yesterday, with inconclusive results, warranting MBS to objectively assess swallow function and safety.     Assessment / Plan / Recommendation Clinical Impression  Dysphagia Diagnosis: Mild oral phase dysphagia;Severe pharyngeal phase dysphagia;Moderate pharyngeal phase dysphagia Clinical impression: Mild sensory and motor based oral dysphagia, characterized by poor bolus formation of puree consistency.  Moderate-severe sensory and motor based pharyngeal dysphagia, characterized by significantly delayed swallow reflex (at pyriform on all but solid consistency), marked vallecular residue, minimal laryngeal movement during the swallow, and  penetration of all consistencies tested.  Thin liquid was the only consistency in which aspiration was seen (weak ineffective cough response noted), however, risk of aspiration is significant with all consistencies due to amount of vallecular residue, poor clearing of residue, and pt's inability to follow commands consistently to facilitate clearing of residue. Pt is unable to assume compensatory positions (chin tuck) due to C-Collar in place.  No cough responses were elicited during po trials (except following cracker presentation), however, in the same manner as BSE yesterday, pt was noted to begin coughing after primary trials had been completed.  Esophageal  scan revealed no significant stasis, and no backflow to the pharynx was noted during the study.  Delayed coughing may be pt's response to the sensation caused by significant vallecular residue.  Significant time was allowed following po presentation for dry swallows, and/or spontaneous clearing of vallecular residue, however, residue remained, and was anticipated to thin with secretions and eventually be aspirated.  Pt is at very high risk of aspiration, and is unsafe for po intake at this time.  Baseline deficits in language, and anticipated apraxia, in addition to possible swelling associated with cervical fracture, render pt inappropriate for intensive dysphagia therapy.  If current deficits are related to swelling, repeat MBS is recommended once swelling has decreased, as pt was tolerating a regular diet and thin liquids prior to this incident.    Treatment Recommendation  Therapy as outlined in treatment plan below    Diet Recommendation NPO   Medication Administration: Via alternative means    Other  Recommendations Oral Care  Recommendations: Oral care QID   Follow Up Recommendations  Skilled Nursing facility    Frequency and Duration min 1 x/week  1 week   Pertinent Vitals/Pain None indicated    SLP Swallow Goals Goal #3: Pt's  sister will participate in training and education re: swallow function, risk of aspiration, and potential for improvement.   General Date of Onset: 12/18/12 HPI: 62 year old male admitted 12/18/12. Pt seen at [facility] ED due to failure to thrive, and poor po intake, but fell just prior to leaving, sustaining cervical fx.  Pt with PMH COPD, and CVA with expressive aphasia. Now with Aspen collar in place.  BSE completed yesterday, with inconclusive results, warranting MBS to objectively assess swallow function and safety. Type of Study: Modified Barium Swallowing Study Reason for Referral: Objectively evaluate swallowing function Previous Swallow Assessment: BSE 12/19/12 Diet Prior to this Study: NPO Temperature Spikes Noted: No Respiratory Status: Room air History of Recent Intubation: No Behavior/Cognition: Alert;Cooperative;Doesn't follow directions Oral Cavity - Dentition: Missing dentition Oral Motor / Sensory Function: Impaired - see Bedside swallow eval Self-Feeding Abilities: Needs assist Patient Positioning: Upright in chair Baseline Vocal Quality: Clear;Low vocal intensity Volitional Cough: Cognitively unable to elicit Volitional Swallow: Unable to elicit Anatomy: Within functional limits Pharyngeal Secretions: Not observed secondary MBS    Reason for Referral Objectively evaluate swallowing function   Oral Phase Oral Preparation/Oral Phase Oral Phase: Impaired Oral - Nectar Oral - Nectar Teaspoon: Within functional limits Oral - Nectar Straw: Within functional limits Oral - Thin Oral - Thin Straw: Within functional limits Oral - Solids Oral - Puree: Weak lingual manipulation;Reduced posterior propulsion;Piecemeal swallowing;Delayed oral transit;Other (Comment) (poor bolus formation) Oral - Regular: Impaired mastication;Delayed oral transit;Piecemeal swallowing;Reduced posterior propulsion;Other (Comment) (poor bolus formation)   Pharyngeal Phase Pharyngeal Phase Pharyngeal  Phase: Impaired Pharyngeal - Nectar Pharyngeal - Nectar Teaspoon: Premature spillage to valleculae;Premature spillage to pyriform sinuses;Delayed swallow initiation;Reduced epiglottic inversion;Reduced pharyngeal peristalsis;Reduced anterior laryngeal mobility;Reduced laryngeal elevation;Reduced airway/laryngeal closure;Reduced tongue base retraction;Penetration/Aspiration during swallow;Pharyngeal residue - valleculae;Pharyngeal residue - pyriform sinuses (pyriform residue cleared with dry swallow) Penetration/Aspiration details (nectar teaspoon): Material enters airway, CONTACTS cords and not ejected out Pharyngeal - Nectar Straw: Delayed swallow initiation;Premature spillage to valleculae;Premature spillage to pyriform sinuses;Reduced pharyngeal peristalsis;Reduced epiglottic inversion;Reduced anterior laryngeal mobility;Reduced laryngeal elevation;Reduced airway/laryngeal closure;Reduced tongue base retraction;Penetration/Aspiration during swallow;Pharyngeal residue - valleculae;Pharyngeal residue - pyriform sinuses (pyriform residue cleared with dry swallow) Penetration/Aspiration details (nectar straw): Material enters airway, CONTACTS cords and not ejected out Pharyngeal - Thin Pharyngeal - Thin Straw: Delayed swallow initiation;Premature spillage to valleculae;Premature spillage to pyriform sinuses;Reduced pharyngeal peristalsis;Reduced epiglottic inversion;Reduced anterior laryngeal mobility;Reduced laryngeal elevation;Reduced airway/laryngeal closure;Reduced tongue base retraction;Penetration/Aspiration after swallow;Trace aspiration;Pharyngeal residue - valleculae;Pharyngeal residue - pyriform sinuses (pyriform residue cleared with dry swallow) Penetration/Aspiration details (thin straw): Material enters airway, passes BELOW cords and not ejected out despite cough attempt by patient (very weak, ineffective reflexive cough.) Pharyngeal - Solids Pharyngeal - Puree: Delayed swallow  initiation;Premature spillage to pyriform sinuses;Premature spillage to valleculae;Pharyngeal residue - valleculae;Pharyngeal residue - pyriform sinuses;Reduced pharyngeal peristalsis;Reduced epiglottic inversion;Reduced anterior laryngeal mobility;Reduced laryngeal elevation;Reduced airway/laryngeal closure;Reduced tongue base retraction;Penetration/Aspiration before swallow Penetration/Aspiration details (puree): Material enters airway, CONTACTS cords and not ejected out Pharyngeal - Regular: Delayed swallow initiation;Premature spillage to valleculae;Reduced pharyngeal peristalsis;Reduced epiglottic inversion;Reduced anterior laryngeal mobility;Reduced laryngeal elevation;Reduced airway/laryngeal closure;Reduced tongue base retraction;Pharyngeal residue - valleculae;Other (Comment) (cough noted after the swallow, no pen/asp seen)  Cervical Esophageal Phase       Cervical Esophageal Phase Cervical Esophageal Phase:  WFL    Functional Assessment Tool Used: Obejctive study via MBS Functional Limitations: Swallowing Swallow Current Status ([id number]): At least 80 percent but less than 100 percent impaired, limited or restricted Swallow Goal Status [phone]): At least 60 percent but less than 80 percent impaired, limited or restricted   Addyson Traub B. Murvin Natal [facility], CCC-SLP [phone] [name] 12/20/2012, 12:07 PM

## 2012-12-20 NOTE — Discharge Summary (Signed)
Physician Discharge Summary  CHANDAN FLY YQM:[id number] DOB: 08-30-1950 DOA: 12/18/2012  PCP: No primary provider on file.  Admit date: 12/18/2012 Discharge date: 12/20/2012  Time spent:45 minutes  Recommendations for Outpatient Follow-up:  1. PCP in 1 week 2. 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 male who is aphasic from previous CVA. He was brought to the [facility] long emergency department by his sister who is his caregiver and healthcare power of attorney. He has not been needing well for the past week or so. She noted no other problems. In the emergency room, he was found to be hyperglycemic with a blood sugar above 500, but not in DKA. He had white blood cell count of 12,000, but you a.m. chest x-ray were negative. His hyperglycemia was treated with IV fluid and insulin. His blood sugars improved and he was found to be discharged by the ED provider. Patient's family member reportedly sat the patient up in bed as the nurse was getting the wheelchair to wheel him out of the emergency room. He reportedly fell face first onto the floor. He suffered a hematoma and laceration of the scalp he was noted to have some blood in his mouth, but has poor  dentition and unclear whether he suffered any dental injuries. CT of the brain showed no skull fracture or intracranial issue. CT of the maxillofacial bones showed no fracture. CT of the C-spine showed acute C2 body fracture with 4 mm fracture widening and mild posterior bony retropulsion up to the margin with the dens where there is an axillary oriented fracture extending to the anterior body. Fracture extends into the lateral masses bilaterally reaching to the C1-C2 facet joints. Bilateral C1 arch fractures, nondisplaced. Hemorrhage around C1-C2 when combined with mild posterior retropulsion at C2 likely some cord flattening. The ED provider spoke with Dr. Gerlene Fee who recommended a C-spine collar.  Hospital Course:  1. C2 fracture: following fall - there is fracture displacement along the C2-3 disc with mild bony retropulsion, the retropulsed fragment combined with epidural hematoma results in canal stenosis with mild cord flattening.  - Will hold aspirin  given the epidural hematoma.  - NSG consulted and recommended Hard cervical collar at all times and FU in office in 3-4 weeks -not felt to be appropriate for surgical intervention  2. DM (diabetes mellitus), type 2, uncontrolled.  -continue Lantus, SSI,   3. Dysphagia Acute on chronic due to CVA with worsening now following C2 fracture and hard cervical collar. I discussed options with sister and HCPoA and primary caregiver, she does not want feeding tube and is not ready for Hospice at this time. At this time will try Dysphagia diet with  nectar thick liquids and speech therapy follow up through home health services. His sister is open to considering Hospice services if he continues to decline further. Risk of aspiration pneumonia, explained in detail to her.  5. HTN (hypertension)  -stable  5.Left ventricular dysfunction: No evidence of congestive heart failure.  -Last echo in September of 2013 showed ejection fraction of 25-30%   6.  Leukocytosis  -likely reactive.  -negative chest x-ray and urinalysis   7. History of 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, dysphagia, apraxia and some dementia Per sister he is DO NOT RESUSCITATE and would not want feeding tubes. As noted above, will need re-discussion about hospice down the road as he continues to decline.  8. Decubitus ulcer of sacral region, stage 2. - Change position every 2 hours.  -seen by Wound RN  9. PVD:, R AKA, L BKA   10. Atrial fibrillation: Not a Coumadin candidate due to falls, poor prognosis and now epidural hematoma..   Code Status: DNR   Consultations:  Speech therapy  NSG Dr.Kritzer  Discharge Exam: Filed Vitals:   12/19/12 1853 12/20/12 0200 12/20/12 0507 12/20/12 0554  BP: 163/90 161/93 157/111 142/90  Pulse: 83 84 94   Temp: 97.9 F (36.6 C) 97.6 F (36.4 C) 98.3 F (36.8 C)   TempSrc: Oral Oral Oral   Resp: 18     Weight:      SpO2: 100% 95% 100%     General: Alert, awake, mumbles at baseline Cardiovascular: S1S2/RRR Respiratory: CTAB  Discharge Instructions  Discharge Orders   Future Orders Complete By Expires     Discharge instructions  As directed     Comments:      Pureed Diet with Nectar thickened liquids        Medication List    STOP taking these medications       aspirin EC 81 MG tablet      TAKE these medications       digoxin 0.125 MG tablet  Commonly known as:  LANOXIN  Take 0.125 mg by mouth every morning.     glipiZIDE 5 MG tablet  Commonly known as:  GLUCOTROL  Take 5 mg by mouth 2 (two) times daily before a meal.     HYDROcodone-acetaminophen 5-325 MG per tablet  Commonly known as:  NORCO/VICODIN  Take 1 tablet by mouth every 6 (six) hours as needed for pain.     insulin glargine 100 UNIT/ML injection  Commonly known as:  LANTUS  Inject 5 Units into the skin every morning.     lisinopril 20 MG tablet  Commonly known as:  PRINIVIL,ZESTRIL  Take 20 mg by mouth every  morning.     pravastatin 20 MG tablet  Commonly known as:  PRAVACHOL  Take 20 mg by mouth every morning.       No Known Allergies     Follow-up Information   Follow up with PCP. Schedule an appointment as soon as possible for a visit in 1 week.       The results of significant diagnostics from this hospitalization (including imaging, microbiology, ancillary and laboratory) are listed below for reference.    Significant Diagnostic Studies: Dg Chest 2 View  12/18/2012   *RADIOLOGY REPORT*  Clinical Data: Weakness.  CHEST - 2 VIEW  Comparison: Chest x-ray 10/22/2010.  Findings: Lung volumes are normal.  No acute consolidative airspace disease.  No pleural effusions.  Multiple bullae are noted in the lung apices bilaterally (left greater  than right).  No evidence of pulmonary edema.  Heart size is normal.  Mediastinal contours are within normal limits.  IMPRESSION: 1.  No radiographic evidence of acute cardiopulmonary disease. 2.  Mild emphysematous changes in the lung apices bilaterally (left greater than right) again noted.   Original Report Authenticated By: Trudie Reed, M.D.   Ct Head Wo Contrast  12/18/2012   **ADDENDUM** CREATED: 12/18/2012 23:51:02  These results were called by telephone on  12/18/12 at 2355  to Dr Otila Kluver, who verbally acknowledged these results.  **END ADDENDUM** SIGNED BY: Audry Riles  12/18/2012   *RADIOLOGY REPORT*  Clinical Data:  Failure to thrive.  CT HEAD WITHOUT CONTRAST CT MAXILLOFACIAL WITHOUT CONTRAST CT CERVICAL SPINE WITHOUT CONTRAST  Technique:  Multidetector CT imaging of the head, cervical spine, and maxillofacial structures were performed using the standard protocol without intravenous contrast. Multiplanar CT image reconstructions of the cervical spine and maxillofacial structures were also generated.  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 Status post wall up mastoidectomy with soft  tissue or material with the mastoid bowl.  No aggressive osteolytic features.  No acute calvarial abnormality.  The orbits in imaged paranasal sinuses are unremarkable.  Encephalomalacia and gliosis involving the majority of the left MCA territory, consistent with remote infarction.  There is global cerebral volume loss and chronic small vessel ischemic changes. Extensive intracranial calcific atherosclerosis.  No evidence of acute infarct, hemorrhage, hydrocephalus, or mass lesion/shift. Diagnostic sensitivity is decreased due to patient motion.  IMPRESSION:  1.  No evidence of acute intracranial disease. 2.  Remote left MCA infarction.  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  There has been a wall up mastoidectomy.  Nonspecific tissue or material within the mastoidectomy pole, without erosive features to confirm cholesteatoma.  Patient motion decreases sensitivity for detecting acute fracture. No acute osseous abnormality within the face detected.  The mandible is located.  There are numerous missing teeth, and numerous caries in the remaining teeth, most notable in the lower left remaining molar where this is endodontal or peridontal erosions.  Remote left MCA territory infarction.  No acute orbital abnormality.  IMPRESSION:  1.  No acute facial fracture detected.  Sensitivity decreased by patient motion.  CT CERVICAL SPINE  Findings:   Acute C2 body fracture, coronally oriented extending from the C2-3 disc (where there is 4 mm of fracture widening and mild posterior bony retropulsion, up to the margin with the dens, where there is an axially oriented fracture extending the anterior body.  The fracture extends into the lateral masses bilaterally, reaching to the C1-C2 facet joints, without offset.  Bilateral posterior C1 arch fractures, nondisplaced.  High-density epidural material circumferentially at the levels of C1 and C2, consistent with hemorrhage.   When combined with mild posterior retropulsion at C2, there is likely some cord flattening.  Diffuse advanced degenerative disc disease with ankylosis at C4-5, C5-6, C6-7.  Mild irregularity at the medial margins of the bilateral occipital condyles, without definitive fracture.  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  IMPRESSION:  1.  Complex C2 body and lateral mass fracture as described above. There is fracture displacement along the C2-3 disc with mild bony retropulsion.  The retropulsed fragment combined with epidural hematoma results in canal stenosis with mild cord flattening. 2.  Bilateral C1 posterior arch fractures.  Original Report Authenticated By: Tiburcio Pea   Ct Cervical Spine Wo  Contrast  12/18/2012   **ADDENDUM** CREATED: 12/18/2012 23:51:02  These results were called by telephone on  12/18/12 at 2355  to Dr Otila Kluver, who verbally acknowledged these results.  **END ADDENDUM** SIGNED BY: Audry Riles  12/18/2012   *RADIOLOGY REPORT*  Clinical Data:  Failure to thrive.  CT HEAD WITHOUT CONTRAST CT MAXILLOFACIAL WITHOUT CONTRAST CT CERVICAL SPINE WITHOUT CONTRAST  Technique:  Multidetector CT imaging of the head, cervical spine, and maxillofacial structures were performed using the standard protocol without intravenous contrast. Multiplanar CT image reconstructions of the cervical spine and maxillofacial structures were also generated.  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 Status post wall up mastoidectomy with soft tissue or material with the mastoid bowl.  No aggressive osteolytic features.  No acute calvarial abnormality.  The orbits in imaged paranasal sinuses are unremarkable.  Encephalomalacia and gliosis involving the majority of the left MCA territory, consistent with remote infarction.  There is global cerebral volume loss and chronic small vessel ischemic changes. Extensive intracranial calcific atherosclerosis.  No evidence of  acute infarct, hemorrhage, hydrocephalus, or mass lesion/shift. Diagnostic sensitivity is decreased due to patient motion.  IMPRESSION:  1.  No evidence of acute intracranial disease. 2.  Remote left MCA infarction.  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  There has been a wall up mastoidectomy.  Nonspecific tissue or material within the mastoidectomy pole, without erosive features to confirm cholesteatoma.  Patient motion decreases sensitivity for detecting acute fracture. No acute osseous abnormality within the face detected.  The mandible is located.  There are numerous missing teeth, and numerous caries in the remaining teeth, most notable in the lower left remaining molar where this is endodontal or peridontal erosions.  Remote left MCA territory infarction.  No acute orbital abnormality.  IMPRESSION:  1.  No acute facial fracture detected.  Sensitivity decreased by patient motion.  CT CERVICAL SPINE  Findings:   Acute C2 body fracture, coronally oriented extending from the C2-3 disc (where there is 4 mm of fracture widening and mild posterior bony retropulsion, up to the margin with the dens, where there is an axially oriented fracture extending the anterior body.  The fracture extends into the lateral masses bilaterally, reaching to the C1-C2 facet joints, without offset.  Bilateral posterior C1 arch fractures, nondisplaced.  High-density epidural material circumferentially at the levels of C1 and C2, consistent with hemorrhage.  When combined with mild posterior retropulsion at C2, there is likely some cord flattening.  Diffuse advanced degenerative disc disease with ankylosis at C4-5, C5-6, C6-7.  Mild irregularity at the medial margins of the bilateral occipital condyles, without definitive fracture.  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  IMPRESSION:  1.  Complex C2 body and lateral mass fracture as described above. There is fracture displacement along  the C2-3 disc with mild bony retropulsion.  The retropulsed fragment combined with epidural hematoma results in canal stenosis with mild cord flattening. 2.  Bilateral C1 posterior arch fractures.  Original Report Authenticated By: Tiburcio Pea   Ct Maxillofacial Wo Cm  12/18/2012   **ADDENDUM** CREATED: 12/18/2012 23:51:02  These results were called by telephone on  12/18/12 at 2355  to Dr Otila Kluver, who verbally acknowledged these results.  **END ADDENDUM** SIGNED BY: Audry Riles  12/18/2012   *RADIOLOGY REPORT*  Clinical Data:  Failure to thrive.  CT HEAD WITHOUT CONTRAST CT MAXILLOFACIAL WITHOUT CONTRAST CT CERVICAL  SPINE WITHOUT CONTRAST  Technique:  Multidetector CT imaging of the head, cervical spine, and maxillofacial structures were performed using the standard protocol without intravenous contrast. Multiplanar CT image reconstructions of the cervical spine and maxillofacial structures were also generated.  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 Status post wall up mastoidectomy with soft tissue or material with the mastoid bowl.  No aggressive osteolytic features.  No acute calvarial abnormality.  The orbits in imaged paranasal sinuses are unremarkable.  Encephalomalacia and gliosis involving the majority of the left MCA territory, consistent with remote infarction.  There is global cerebral volume loss and chronic small vessel ischemic changes. Extensive intracranial calcific atherosclerosis.  No evidence of acute infarct, hemorrhage, hydrocephalus, or mass lesion/shift. Diagnostic sensitivity is decreased due to patient motion.  IMPRESSION:  1.  No evidence of acute intracranial disease. 2.  Remote left MCA infarction.  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  There has been a wall up mastoidectomy.  Nonspecific tissue or material within the mastoidectomy pole, without erosive  features to confirm cholesteatoma.  Patient motion decreases sensitivity for detecting acute fracture. No acute osseous abnormality within the face detected.  The mandible is located.  There are numerous missing teeth, and numerous caries in the remaining teeth, most notable in the lower left remaining molar where this is endodontal or peridontal erosions.  Remote left MCA territory infarction.  No acute orbital abnormality.  IMPRESSION:  1.  No acute facial fracture detected.  Sensitivity decreased by patient motion.  CT CERVICAL SPINE  Findings:   Acute C2 body fracture, coronally oriented extending from the C2-3 disc (where there is 4 mm of fracture widening and mild posterior bony retropulsion, up to the margin with the dens, where there is an axially oriented fracture extending the anterior body.  The fracture extends into the lateral masses bilaterally, reaching to the C1-C2 facet joints, without offset.  Bilateral posterior C1 arch fractures, nondisplaced.  High-density epidural material circumferentially at the levels of C1 and C2, consistent with hemorrhage.  When combined with mild posterior retropulsion at C2, there is likely some cord flattening.  Diffuse advanced degenerative disc disease with ankylosis at C4-5, C5-6, C6-7.  Mild irregularity at the medial margins of the bilateral occipital condyles, without definitive fracture.  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  IMPRESSION:  1.  Complex C2 body and lateral mass fracture as described above. There is fracture displacement along the C2-3 disc with mild bony retropulsion.  The retropulsed fragment combined with epidural hematoma results in canal stenosis with mild cord flattening. 2.  Bilateral C1 posterior arch fractures.  Original Report Authenticated By: Tiburcio Pea    Microbiology: No results found for this or any previous visit (from the past 240 hour(s)).   Labs: Basic Metabolic Panel:  Recent Labs Lab 12/18/12 1516 12/19/12 0900  NA  130*  --   K 3.5  --   CL 89*  --   CO2 23  --   GLUCOSE 561*  --   BUN 16  --   CREATININE 0.75 0.67  CALCIUM 9.3  --    Liver Function Tests: No results found for this basename: AST, ALT, ALKPHOS, BILITOT, PROT, ALBUMIN,  in the last 168 hours  Recent Labs Lab 12/18/12 1516  LIPASE 50   No results found for this basename: AMMONIA,  in the last 168 hours CBC:  Recent Labs  Lab 12/18/12 1516 12/19/12 0900  WBC 12.2* 14.1*  NEUTROABS 8.7*  --   HGB 15.2 14.1  HCT 43.5 41.3  MCV 84.3 84.1  PLT 193 171   Cardiac Enzymes: No results found for this basename: CKTOTAL, CKMB, CKMBINDEX, TROPONINI,  in the last 168 hours BNP: BNP (last 3 results) No results found for this basename: PROBNP,  in the last 8760 hours CBG:  Recent Labs Lab 12/19/12 1133 12/19/12 1659 12/19/12 2217 12/20/12 0853 12/20/12 1233  GLUCAP 179* 194* 118* 127* 277*       Signed:  Evalin Shawhan  [facility] 12/20/2012, 1:52 PM

## 2012-12-20 NOTE — Progress Notes (Signed)
OT NOTE  OT RECOMMEND HHOT in addition to PT recommendation. Please order HHOT in addition to the requested HHPT and RN assistance.   [name]   OTR/L Pager: [phone] Office: [phone] .

## 2013-06-20 ENCOUNTER — Ambulatory Visit (INDEPENDENT_AMBULATORY_CARE_PROVIDER_SITE_OTHER): Payer: PRIVATE HEALTH INSURANCE | Admitting: Cardiovascular Disease

## 2013-06-20 VITALS — BP 150/90 | HR 92

## 2013-06-20 DIAGNOSIS — I10 Essential (primary) hypertension: Secondary | ICD-10-CM

## 2013-06-20 DIAGNOSIS — I4891 Unspecified atrial fibrillation: Secondary | ICD-10-CM

## 2013-06-20 DIAGNOSIS — Z8673 Personal history of transient ischemic attack (TIA), and cerebral infarction without residual deficits: Secondary | ICD-10-CM

## 2013-06-20 DIAGNOSIS — I428 Other cardiomyopathies: Secondary | ICD-10-CM

## 2013-06-20 MED ORDER — CARVEDILOL 3.125 MG PO TABS: 3.1250 mg | ORAL_TABLET | Freq: Two times a day (BID) | ORAL | Status: DC

## 2013-06-20 MED ORDER — APIXABAN 5 MG PO TABS: 5.0000 mg | ORAL_TABLET | Freq: Two times a day (BID) | ORAL | Status: DC

## 2013-06-20 NOTE — Patient Instructions (Signed)
Your physician recommends that you schedule a follow-up appointment in: 1 month follow up with Dr Royann Shivers  SATRT ELIQUIS 5 MG TWICE A DAY  START COREG 3.125 MG TWICE A DAY

## 2013-06-29 ENCOUNTER — Encounter: Payer: Self-pay | Admitting: Cardiovascular Disease
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
## 2013-06-29 NOTE — Assessment & Plan Note (Signed)
Should improve with addition and titration of beta blocker

## 2013-06-29 NOTE — Assessment & Plan Note (Signed)
The left ventricular dysfunction and a history of previous stroke he is at extremely high risk for recurrent embolic events. I have recommended that he start eloquence 5 mg twice a day. We discussed the fact that this medication increases the risk of bleeding including potentially serious complications such as intracranial hemorrhage. Nevertheless the risk benefit ratio appears to justify its use. Other than the unfortunate fall in July of last year he is not known to have had serious injuries.

## 2013-06-29 NOTE — Assessment & Plan Note (Addendum)
He has not suffered from an episode of heart failure exacerbation recently, despite the fact that he has not routinely receiving diuretics. He is on ACE inhibitors and digoxin but is not receiving beta blockers. Functional status is impossible to assess, but by physical exam she does not appear to have hypervolemia. Have recommended that he start carvedilol 3.125 mg twice a day. Has been labeled as having nonischemic cardiomyopathy, but I think the likelihood of underlying CAD is extremely high. Since he does not appear to have angina pectoris or progressive cardiomyopathy, invasive evaluation is not justified. In addition it is highly unlikely he would be a candidate for surgical revascularization.

## 2013-07-31 ENCOUNTER — Other Ambulatory Visit: Payer: Self-pay | Admitting: Pharmacist Clinician (PhC)/ Clinical Pharmacy Specialist

## 2013-07-31 MED ORDER — APIXABAN 5 MG PO TABS: 5.0000 mg | ORAL_TABLET | Freq: Two times a day (BID) | ORAL | Status: DC

## 2013-10-14 ENCOUNTER — Inpatient Hospital Stay (HOSPITAL_COMMUNITY)
Admission: EM | Admit: 2013-10-14 | Discharge: 2013-10-17 | DRG: 637 | Disposition: A | Discharge status: Home / Self Care | Payer: PRIVATE HEALTH INSURANCE | Attending: Internal Medicine | Admitting: Internal Medicine

## 2013-10-14 ENCOUNTER — Emergency Department (HOSPITAL_COMMUNITY): Payer: PRIVATE HEALTH INSURANCE

## 2013-10-14 ENCOUNTER — Encounter (HOSPITAL_COMMUNITY): Payer: Self-pay | Admitting: Emergency Medicine

## 2013-10-14 DIAGNOSIS — T5891XA Toxic effect of carbon monoxide from unspecified source, accidental (unintentional), initial encounter: Secondary | ICD-10-CM

## 2013-10-14 DIAGNOSIS — I739 Peripheral vascular disease, unspecified: Secondary | ICD-10-CM | POA: Diagnosis present

## 2013-10-14 DIAGNOSIS — J449 Chronic obstructive pulmonary disease, unspecified: Secondary | ICD-10-CM | POA: Diagnosis present

## 2013-10-14 DIAGNOSIS — S78119A Complete traumatic amputation at level between unspecified hip and knee, initial encounter: Secondary | ICD-10-CM

## 2013-10-14 DIAGNOSIS — Z681 Body mass index (BMI) 19 or less, adult: Secondary | ICD-10-CM

## 2013-10-14 DIAGNOSIS — Z8673 Personal history of transient ischemic attack (TIA), and cerebral infarction without residual deficits: Secondary | ICD-10-CM

## 2013-10-14 DIAGNOSIS — I10 Essential (primary) hypertension: Secondary | ICD-10-CM

## 2013-10-14 DIAGNOSIS — F1410 Cocaine abuse, uncomplicated: Secondary | ICD-10-CM

## 2013-10-14 DIAGNOSIS — E1100 Type 2 diabetes mellitus with hyperosmolarity without nonketotic hyperglycemic-hyperosmolar coma (NKHHC): Principal | ICD-10-CM | POA: Diagnosis present

## 2013-10-14 DIAGNOSIS — I214 Non-ST elevation (NSTEMI) myocardial infarction: Secondary | ICD-10-CM

## 2013-10-14 DIAGNOSIS — S88119A Complete traumatic amputation at level between knee and ankle, unspecified lower leg, initial encounter: Secondary | ICD-10-CM

## 2013-10-14 DIAGNOSIS — J9600 Acute respiratory failure, unspecified whether with hypoxia or hypercapnia: Secondary | ICD-10-CM

## 2013-10-14 DIAGNOSIS — I69920 Aphasia following unspecified cerebrovascular disease: Secondary | ICD-10-CM

## 2013-10-14 DIAGNOSIS — Z794 Long term (current) use of insulin: Secondary | ICD-10-CM

## 2013-10-14 DIAGNOSIS — F17200 Nicotine dependence, unspecified, uncomplicated: Secondary | ICD-10-CM

## 2013-10-14 DIAGNOSIS — I5022 Chronic systolic (congestive) heart failure: Secondary | ICD-10-CM | POA: Diagnosis present

## 2013-10-14 DIAGNOSIS — R748 Abnormal levels of other serum enzymes: Secondary | ICD-10-CM

## 2013-10-14 DIAGNOSIS — I69991 Dysphagia following unspecified cerebrovascular disease: Secondary | ICD-10-CM

## 2013-10-14 DIAGNOSIS — S129XXA Fracture of neck, unspecified, initial encounter: Secondary | ICD-10-CM

## 2013-10-14 DIAGNOSIS — G9340 Encephalopathy, unspecified: Secondary | ICD-10-CM

## 2013-10-14 DIAGNOSIS — I5020 Unspecified systolic (congestive) heart failure: Secondary | ICD-10-CM

## 2013-10-14 DIAGNOSIS — J4489 Other specified chronic obstructive pulmonary disease: Secondary | ICD-10-CM | POA: Diagnosis present

## 2013-10-14 DIAGNOSIS — S0101XA Laceration without foreign body of scalp, initial encounter: Secondary | ICD-10-CM

## 2013-10-14 DIAGNOSIS — R627 Adult failure to thrive: Secondary | ICD-10-CM | POA: Diagnosis present

## 2013-10-14 DIAGNOSIS — E875 Hyperkalemia: Secondary | ICD-10-CM | POA: Diagnosis present

## 2013-10-14 DIAGNOSIS — F1010 Alcohol abuse, uncomplicated: Secondary | ICD-10-CM

## 2013-10-14 DIAGNOSIS — I252 Old myocardial infarction: Secondary | ICD-10-CM

## 2013-10-14 DIAGNOSIS — Z66 Do not resuscitate: Secondary | ICD-10-CM | POA: Diagnosis present

## 2013-10-14 DIAGNOSIS — I428 Other cardiomyopathies: Secondary | ICD-10-CM

## 2013-10-14 DIAGNOSIS — E860 Dehydration: Secondary | ICD-10-CM

## 2013-10-14 DIAGNOSIS — IMO0002 Reserved for concepts with insufficient information to code with codable children: Secondary | ICD-10-CM

## 2013-10-14 DIAGNOSIS — T59811A Toxic effect of smoke, accidental (unintentional), initial encounter: Secondary | ICD-10-CM

## 2013-10-14 DIAGNOSIS — E119 Type 2 diabetes mellitus without complications: Secondary | ICD-10-CM

## 2013-10-14 DIAGNOSIS — I519 Heart disease, unspecified: Secondary | ICD-10-CM

## 2013-10-14 DIAGNOSIS — D72829 Elevated white blood cell count, unspecified: Secondary | ICD-10-CM

## 2013-10-14 DIAGNOSIS — R1310 Dysphagia, unspecified: Secondary | ICD-10-CM

## 2013-10-14 DIAGNOSIS — E1165 Type 2 diabetes mellitus with hyperglycemia: Secondary | ICD-10-CM

## 2013-10-14 DIAGNOSIS — F801 Expressive language disorder: Secondary | ICD-10-CM | POA: Diagnosis present

## 2013-10-14 DIAGNOSIS — I509 Heart failure, unspecified: Secondary | ICD-10-CM | POA: Diagnosis present

## 2013-10-14 DIAGNOSIS — E872 Acidosis, unspecified: Secondary | ICD-10-CM

## 2013-10-14 DIAGNOSIS — Z87891 Personal history of nicotine dependence: Secondary | ICD-10-CM

## 2013-10-14 DIAGNOSIS — L98499 Non-pressure chronic ulcer of skin of other sites with unspecified severity: Secondary | ICD-10-CM | POA: Diagnosis present

## 2013-10-14 DIAGNOSIS — Z89512 Acquired absence of left leg below knee: Secondary | ICD-10-CM

## 2013-10-14 DIAGNOSIS — R630 Anorexia: Secondary | ICD-10-CM

## 2013-10-14 DIAGNOSIS — L89152 Pressure ulcer of sacral region, stage 2: Secondary | ICD-10-CM

## 2013-10-14 DIAGNOSIS — R1312 Dysphagia, oropharyngeal phase: Secondary | ICD-10-CM | POA: Diagnosis present

## 2013-10-14 DIAGNOSIS — Z89511 Acquired absence of right leg below knee: Secondary | ICD-10-CM

## 2013-10-14 DIAGNOSIS — J705 Respiratory conditions due to smoke inhalation: Secondary | ICD-10-CM

## 2013-10-14 DIAGNOSIS — E46 Unspecified protein-calorie malnutrition: Secondary | ICD-10-CM

## 2013-10-14 DIAGNOSIS — E43 Unspecified severe protein-calorie malnutrition: Secondary | ICD-10-CM

## 2013-10-14 DIAGNOSIS — I69959 Hemiplegia and hemiparesis following unspecified cerebrovascular disease affecting unspecified side: Secondary | ICD-10-CM

## 2013-10-14 DIAGNOSIS — I4891 Unspecified atrial fibrillation: Secondary | ICD-10-CM

## 2013-10-14 LAB — CBC
HCT: 47.2 % (ref 39.0–52.0)
HEMATOCRIT: 45.7 % (ref 39.0–52.0)
HEMOGLOBIN: 16.3 g/dL (ref 13.0–17.0)
Hemoglobin: 15.2 g/dL (ref 13.0–17.0)
MCH: 30.6 pg (ref 26.0–34.0)
MCH: 29.8 pg (ref 26.0–34.0)
MCHC: 34.5 g/dL (ref 30.0–36.0)
MCHC: 33.3 g/dL (ref 30.0–36.0)
MCV: 88.6 fL (ref 78.0–100.0)
MCV: 89.6 fL (ref 78.0–100.0)
PLATELETS: 203 10*3/uL (ref 150–400)
PLATELETS: 193 10*3/uL (ref 150–400)
RBC: 5.33 MIL/uL (ref 4.22–5.81)
RBC: 5.1 MIL/uL (ref 4.22–5.81)
RDW: 14.6 % (ref 11.5–15.5)
RDW: 14.5 % (ref 11.5–15.5)
WBC: 11.3 10*3/uL — ABNORMAL HIGH (ref 4.0–10.5)
WBC: 9.9 10*3/uL (ref 4.0–10.5)

## 2013-10-14 LAB — COMPREHENSIVE METABOLIC PANEL
ALT: 63 U/L — ABNORMAL HIGH (ref 0–53)
AST: 29 U/L (ref 0–37)
Albumin: 0.2 g/dL — ABNORMAL LOW (ref 3.5–5.2)
Alkaline Phosphatase: 5 U/L — ABNORMAL LOW (ref 39–117)
BUN: 29 mg/dL — ABNORMAL HIGH (ref 6–23)
CALCIUM: 9.4 mg/dL (ref 8.4–10.5)
CO2: 19 meq/L (ref 19–32)
CREATININE: 1.05 mg/dL (ref 0.50–1.35)
Chloride: 101 mEq/L (ref 96–112)
GFR, EST AFRICAN AMERICAN: 85 mL/min — AB (ref >90)
GFR, EST NON AFRICAN AMERICAN: 73 mL/min — AB (ref >90)
GLUCOSE: 497 mg/dL — AB (ref 70–99)
Potassium: 5.4 mEq/L — ABNORMAL HIGH (ref 3.7–5.3)
SODIUM: 137 meq/L (ref 137–147)
TOTAL PROTEIN: 7.5 g/dL (ref 6.0–8.3)
Total Bilirubin: 0.7 mg/dL (ref 0.3–1.2)

## 2013-10-14 LAB — CBG MONITORING, ED
Glucose-Capillary: 482 mg/dL — ABNORMAL HIGH (ref 70–99)
Glucose-Capillary: 392 mg/dL — ABNORMAL HIGH (ref 70–99)

## 2013-10-14 LAB — CREATININE, SERUM
CREATININE: 0.99 mg/dL (ref 0.50–1.35)
GFR calc non Af Amer: 85 mL/min — ABNORMAL LOW (ref >90)

## 2013-10-14 LAB — URINALYSIS, ROUTINE W REFLEX MICROSCOPIC
Bilirubin Urine: NEGATIVE
Glucose, UA: >1000 mg/dL — AB
Ketones, ur: NEGATIVE mg/dL
LEUKOCYTES UA: NEGATIVE
NITRITE: NEGATIVE
PH: 5.5 (ref 5.0–8.0)
Protein, ur: 100 mg/dL — AB
SPECIFIC GRAVITY, URINE: 1.029 (ref 1.005–1.030)
UROBILINOGEN UA: 0.2 mg/dL (ref 0.0–1.0)

## 2013-10-14 LAB — URINE MICROSCOPIC-ADD ON

## 2013-10-14 LAB — I-STAT VENOUS BLOOD GAS, ED
Bicarbonate: 24.8 mEq/L — ABNORMAL HIGH (ref 20.0–24.0)
O2 SAT: 88 %
TCO2: 26 mmol/L (ref 0–100)
pCO2, Ven: 40.3 mmHg — ABNORMAL LOW (ref 45.0–50.0)
pH, Ven: 7.397 — ABNORMAL HIGH (ref 7.250–7.300)
pO2, Ven: 55 mmHg — ABNORMAL HIGH (ref 30.0–45.0)

## 2013-10-14 LAB — DIGOXIN LEVEL

## 2013-10-14 MED ORDER — DIGOXIN 125 MCG PO TABS
0.1250 mg | ORAL_TABLET | Freq: Every morning | ORAL | Status: DC
  Administered 2013-10-16 – 2013-10-17 (×2): 0.125 mg via ORAL
  Filled 2013-10-14 (×3): qty 1

## 2013-10-14 MED ORDER — SIMVASTATIN 10 MG PO TABS
10.0000 mg | ORAL_TABLET | Freq: Every day | ORAL | Status: DC
  Administered 2013-10-15 – 2013-10-16 (×2): 10 mg via ORAL
  Filled 2013-10-14 (×3): qty 1

## 2013-10-14 MED ORDER — INSULIN ASPART 100 UNIT/ML ~~LOC~~ SOLN
10.0000 [IU] | Freq: Once | SUBCUTANEOUS | Status: AC
  Administered 2013-10-14: 10 [IU] via INTRAVENOUS
  Filled 2013-10-14: qty 1

## 2013-10-14 MED ORDER — SODIUM CHLORIDE 0.9 % IV SOLN
INTRAVENOUS | Status: DC
  Administered 2013-10-15 – 2013-10-16 (×3): via INTRAVENOUS
  Administered 2013-10-16: 50 mL/h via INTRAVENOUS

## 2013-10-14 MED ORDER — SODIUM CHLORIDE 0.9 % IV BOLUS (SEPSIS)
500.0000 mL | Freq: Once | INTRAVENOUS | Status: AC
  Administered 2013-10-14: 500 mL via INTRAVENOUS

## 2013-10-14 MED ORDER — SODIUM CHLORIDE 0.9 % IJ SOLN
3.0000 mL | Freq: Two times a day (BID) | INTRAMUSCULAR | Status: DC
Start: 2013-10-14 — End: 2013-10-17
  Administered 2013-10-15 – 2013-10-17 (×2): 3 mL via INTRAVENOUS

## 2013-10-14 MED ORDER — VITAMIN D (ERGOCALCIFEROL) 1.25 MG (50000 UNIT) PO CAPS
50000.0000 [IU] | ORAL_CAPSULE | ORAL | Status: DC
  Administered 2013-10-15: 50000 [IU] via ORAL
  Filled 2013-10-14: qty 1

## 2013-10-14 MED ORDER — INSULIN GLARGINE 100 UNIT/ML ~~LOC~~ SOLN
4.0000 [IU] | Freq: Every day | SUBCUTANEOUS | Status: DC
  Administered 2013-10-15: 4 [IU] via SUBCUTANEOUS
  Filled 2013-10-14 (×2): qty 0.04

## 2013-10-14 MED ORDER — APIXABAN 5 MG PO TABS
5.0000 mg | ORAL_TABLET | Freq: Two times a day (BID) | ORAL | Status: DC
  Administered 2013-10-15 – 2013-10-17 (×6): 5 mg via ORAL
  Filled 2013-10-14 (×7): qty 1

## 2013-10-14 MED ORDER — INSULIN ASPART 100 UNIT/ML ~~LOC~~ SOLN
0.0000 [IU] | SUBCUTANEOUS | Status: DC
  Administered 2013-10-15: 3 [IU] via SUBCUTANEOUS
  Administered 2013-10-15: 9 [IU] via SUBCUTANEOUS
  Administered 2013-10-15: 5 [IU] via SUBCUTANEOUS

## 2013-10-14 MED ORDER — METOPROLOL TARTRATE 25 MG PO TABS
25.0000 mg | ORAL_TABLET | Freq: Two times a day (BID) | ORAL | Status: DC
  Administered 2013-10-15 – 2013-10-17 (×6): 25 mg via ORAL
  Filled 2013-10-14 (×7): qty 1

## 2013-10-14 MED ORDER — HEPARIN SODIUM (PORCINE) 5000 UNIT/ML IJ SOLN: 5000.0000 [IU] | Freq: Three times a day (TID) | INTRAMUSCULAR | Status: DC

## 2013-10-14 MED ORDER — OXYCODONE-ACETAMINOPHEN 5-325 MG PO TABS
1.0000 | ORAL_TABLET | Freq: Once | ORAL | Status: AC
  Administered 2013-10-14: 1 via ORAL
  Filled 2013-10-14: qty 1

## 2013-10-14 MED ORDER — SODIUM POLYSTYRENE SULFONATE 15 GM/60ML PO SUSP
30.0000 g | Freq: Once | ORAL | Status: AC
  Administered 2013-10-15: 30 g via RECTAL
  Filled 2013-10-14: qty 120

## 2013-10-14 MED ORDER — LISINOPRIL 20 MG PO TABS
20.0000 mg | ORAL_TABLET | Freq: Every morning | ORAL | Status: DC
  Administered 2013-10-15 – 2013-10-17 (×3): 20 mg via ORAL
  Filled 2013-10-14 (×3): qty 1

## 2013-10-14 NOTE — ED Provider Notes (Signed)
CSN: [id number]     Arrival date & time 10/14/13  1809 History   First MD Initiated Contact with Patient 10/14/13 1816     Chief Complaint  Patient presents with  . Hyperglycemia     (Consider location/radiation/quality/duration/timing/severity/associated sxs/prior Treatment) HPI 63 year old male brought in by his sister with complaints of decreased oral intake over the last 3 days. The patient has been not eating food-like normal. She has to take care of them fully because of his stroke but knocked out of his right side of his body. He is nonverbal at baseline. The patient has been taking supplement shakes but has not taken any food. The patient has had decreased urine output a couple days ago but says it is a normal urine output. No diarrhea or bloody stools. No complaints from the patient or signs of obvious new pain. Patient has started developing a small sacral ulcer per the sister. Denies any fevers. No signs of confusion per the sister. The sister also notes that his sugar has been up recently.  Past Medical History  Diagnosis Date  . Active smoker   . Alcohol abuse, episodic   . Difficult intubation   . Hypertension   . Dysrhythmia   . COPD (chronic obstructive pulmonary disease)   . Shortness of breath   . Diabetes mellitus   . Peripheral vascular disease   . CHF (congestive heart failure)   . Stroke     has expressive aphasia, weak on Rt. side   Past Surgical History  Procedure Laterality Date  . Vascular surgery    . Leg amputation above knee      right  . Leg amputation below knee      left  . Laryngoscopy  12/27/2011    Procedure: LARYNGOSCOPY;  Surgeon: Christia Reading, MD;  Location: [facility] OR;  Service: ENT;  Laterality: N/A;  direct video laryngoscopy with evaluation of upper airway.reintubation.   No family history on file. History  Substance Use Topics  . Smoking status: Former Smoker -- 1.00 packs/day for 30 years    Types: Cigarettes  . Smokeless tobacco: Never  Used  . Alcohol Use: Yes    Review of Systems  Unable to perform ROS: Patient nonverbal      Allergies  Review of patient's allergies indicates no known allergies.  Home Medications   Prior to Admission medications   Medication Sig Start Date End Date Taking? Authorizing Provider  apixaban (ELIQUIS) 5 MG TABS tablet Take 1 tablet (5 mg total) by mouth 2 (two) times daily. 07/31/13   Mihai Croitoru, MD  carvedilol (COREG) 3.125 MG tablet Take 1 tablet (3.125 mg total) by mouth 2 (two) times daily with a meal. 06/20/13   Mihai Croitoru, MD  digoxin (LANOXIN) 0.125 MG tablet Take 0.125 mg by mouth every morning.    Historical Provider, MD  glipiZIDE (GLUCOTROL) 5 MG tablet Take 5 mg by mouth 2 (two) times daily before a meal.    Historical Provider, MD  insulin glargine (LANTUS) 100 UNIT/ML injection Inject 5 Units into the skin every morning.    Historical Provider, MD  lisinopril (PRINIVIL,ZESTRIL) 20 MG tablet Take 20 mg by mouth every morning.    Historical Provider, MD  pravastatin (PRAVACHOL) 20 MG tablet Take 20 mg by mouth every morning.    Historical Provider, MD   BP 182/89  Pulse 105  Resp 17  Ht 5\' 6"  (1.676 m)  Wt 110 lb (49.896 kg)  BMI 17.76 kg/m2  SpO2  100% Physical Exam  Nursing note and vitals reviewed. Constitutional: He appears cachectic.  HENT:  Head: Normocephalic and atraumatic.  Right Ear: External ear normal.  Left Ear: External ear normal.  Nose: Nose normal.  Eyes: Pupils are equal, round, and reactive to light. Right eye exhibits no discharge. Left eye exhibits no discharge.  Neck: Neck supple.  Cardiovascular: Regular rhythm, normal heart sounds and intact distal pulses.  Tachycardia present.   Pulmonary/Chest: Effort normal.  Abdominal: Soft. There is no tenderness.  Musculoskeletal: He exhibits no edema.       Back:  Neurological: He is alert. GCS eye subscore is 4. GCS verbal subscore is 1. GCS motor subscore is 6.  Left side of body with  5/5 strength at baseline. Unable to move right side of body (baseline). Right facial droop is chronic  Skin: Skin is warm and dry.    ED Course  Procedures (including critical care time) Labs Review Labs Reviewed  CBC - Abnormal; Notable for the following:    WBC 11.3 (*)    All other components within normal limits  COMPREHENSIVE METABOLIC PANEL - Abnormal; Notable for the following:    Potassium 5.4 (*)    Glucose, Bld 497 (*)    BUN 29 (*)    Albumin 0.2 (*)    ALT 63 (*)    Alkaline Phosphatase 5 (*)    GFR calc non Af Amer 73 (*)    GFR calc Af Amer 85 (*)    All other components within normal limits  URINALYSIS, ROUTINE W REFLEX MICROSCOPIC - Abnormal; Notable for the following:    Glucose, UA >1000 (*)    Hgb urine dipstick SMALL (*)    Protein, ur 100 (*)    All other components within normal limits  DIGOXIN LEVEL - Abnormal; Notable for the following:    Digoxin Level <0.3 (*)    All other components within normal limits  CREATININE, SERUM - Abnormal; Notable for the following:    GFR calc non Af Amer 85 (*)    All other components within normal limits  CBG MONITORING, ED - Abnormal; Notable for the following:    Glucose-Capillary 482 (*)    All other components within normal limits  I-STAT VENOUS BLOOD GAS, ED - Abnormal; Notable for the following:    pH, Ven 7.397 (*)    pCO2, Ven 40.3 (*)    pO2, Ven 55.0 (*)    Bicarbonate 24.8 (*)    All other components within normal limits  CBG MONITORING, ED - Abnormal; Notable for the following:    Glucose-Capillary 392 (*)    All other components within normal limits  URINE CULTURE  URINE MICROSCOPIC-ADD ON  CBC  HEMOGLOBIN A1C    Imaging Review Ct Head Wo Contrast  10/14/2013   CLINICAL DATA:  Decreased oral intake for several days.  , weakness  EXAM: CT HEAD WITHOUT CONTRAST  TECHNIQUE: Contiguous axial images were obtained from the base of the skull through the vertex without intravenous contrast.  COMPARISON:   CT HEAD W/O CM dated 12/18/2012  FINDINGS: There is a large area of stable encephalomalacia in the left frontal and anterior parietal lobes. There is stable mild-to-moderate diffuse cerebral and cerebellar atrophy with compensatory ventriculomegaly. There is no evidence of an acute intracranial hemorrhage nor of an evolving ischemic infarction. The cerebellum and brainstem exhibit no acute abnormalities.  At bone window settings the observed portions of the paranasal sinuses and mastoid air cells are  clear. There is no evidence of an acute skull fracture.  IMPRESSION: 1. There are extensive stable chronic changes in the left frontal and anterior parietal lobes consistent with previous ischemic insult. 2. There is no evidence of an acute ischemic or hemorrhagic event. 3. There is stable mild to moderate diffuse cerebral and cerebellar atrophy with compensatory ventriculomegaly.   Electronically Signed   By: David  [place]   On: 10/14/2013 20:07   Dg Chest Portable 1 View  10/14/2013   CLINICAL DATA:  Cough  EXAM: PORTABLE CHEST - 1 VIEW  COMPARISON:  DG CHEST 2 VIEW dated 12/18/2012  FINDINGS: The lungs are adequately inflated. There is no focal infiltrate. There is no pleural effusion or pneumothorax. The cardiopericardial silhouette is top-normal in size. The pulmonary vascularity is not engorged. The observed portions of the bony thorax appear normal.  IMPRESSION: There is no evidence of CHF nor pneumonia nor other acute cardiopulmonary disease.   Electronically Signed   By: David  [place]   On: 10/14/2013 19:24     EKG Interpretation   Date/Time:  Monday Oct 14 2013 18:20:24 EDT Ventricular Rate:  116 PR Interval:    QRS Duration: 114 QT Interval:  375 QTC Calculation: 521 R Axis:   9 Text Interpretation:  Atrial fibrillation Ventricular premature complex  Left ventricular hypertrophy Nonspecific T abnrm, anterolateral leads  Prolonged QT interval similar to 12/18/12 Confirmed by Zalman Hull  MD, Ashely Joshua   ([id number]) on 10/14/2013 6:27:01 PM      MDM   Final diagnoses:  Decreased appetite  Dehydration    Patient with acute decreased by mouth intake leading to dehydration. He is also hyperglycemic without a signs of DKA. No signs of infection causing the symptoms. Will gently hydrate given his history of CHF with EF of 25-30%. However at this time the patient failing to thrive and would likely benefit from IV hydration and possible placement. Is not able to sustain his nutrition at home at this time.    Audree Camel, MD 10/14/13 [phone]

## 2013-10-14 NOTE — ED Notes (Signed)
Bjorn Loser [phone]

## 2013-10-14 NOTE — ED Notes (Signed)
Patient arrives via ems from home with his family. His family reports decreased intake for several days. EMS reports the patient cbg [phone] en route. Pt is nonverbal and has right sided weakness at baseline.

## 2013-10-14 NOTE — ED Notes (Signed)
Admitting md with patient for eval
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
## 2013-10-14 NOTE — ED Notes (Signed)
cxr being completed at bedside

## 2013-10-15 ENCOUNTER — Observation Stay (HOSPITAL_COMMUNITY): Payer: PRIVATE HEALTH INSURANCE

## 2013-10-15 DIAGNOSIS — Z681 Body mass index (BMI) 19 or less, adult: Secondary | ICD-10-CM | POA: Diagnosis not present

## 2013-10-15 DIAGNOSIS — Z66 Do not resuscitate: Secondary | ICD-10-CM | POA: Diagnosis present

## 2013-10-15 DIAGNOSIS — E43 Unspecified severe protein-calorie malnutrition: Secondary | ICD-10-CM | POA: Diagnosis present

## 2013-10-15 DIAGNOSIS — S88119A Complete traumatic amputation at level between knee and ankle, unspecified lower leg, initial encounter: Secondary | ICD-10-CM | POA: Diagnosis not present

## 2013-10-15 DIAGNOSIS — L98499 Non-pressure chronic ulcer of skin of other sites with unspecified severity: Secondary | ICD-10-CM | POA: Diagnosis present

## 2013-10-15 DIAGNOSIS — I69920 Aphasia following unspecified cerebrovascular disease: Secondary | ICD-10-CM | POA: Diagnosis not present

## 2013-10-15 DIAGNOSIS — F1010 Alcohol abuse, uncomplicated: Secondary | ICD-10-CM | POA: Diagnosis present

## 2013-10-15 DIAGNOSIS — I252 Old myocardial infarction: Secondary | ICD-10-CM | POA: Diagnosis not present

## 2013-10-15 DIAGNOSIS — I428 Other cardiomyopathies: Secondary | ICD-10-CM | POA: Diagnosis present

## 2013-10-15 DIAGNOSIS — Z87891 Personal history of nicotine dependence: Secondary | ICD-10-CM | POA: Diagnosis not present

## 2013-10-15 DIAGNOSIS — E860 Dehydration: Secondary | ICD-10-CM

## 2013-10-15 DIAGNOSIS — J449 Chronic obstructive pulmonary disease, unspecified: Secondary | ICD-10-CM | POA: Diagnosis present

## 2013-10-15 DIAGNOSIS — Z794 Long term (current) use of insulin: Secondary | ICD-10-CM | POA: Diagnosis not present

## 2013-10-15 DIAGNOSIS — E1165 Type 2 diabetes mellitus with hyperglycemia: Secondary | ICD-10-CM | POA: Insufficient documentation

## 2013-10-15 DIAGNOSIS — R7309 Other abnormal glucose: Secondary | ICD-10-CM | POA: Diagnosis present

## 2013-10-15 DIAGNOSIS — I509 Heart failure, unspecified: Secondary | ICD-10-CM | POA: Diagnosis present

## 2013-10-15 DIAGNOSIS — F801 Expressive language disorder: Secondary | ICD-10-CM | POA: Diagnosis present

## 2013-10-15 DIAGNOSIS — E1100 Type 2 diabetes mellitus with hyperosmolarity without nonketotic hyperglycemic-hyperosmolar coma (NKHHC): Secondary | ICD-10-CM | POA: Diagnosis present

## 2013-10-15 DIAGNOSIS — I739 Peripheral vascular disease, unspecified: Secondary | ICD-10-CM | POA: Diagnosis present

## 2013-10-15 DIAGNOSIS — I4891 Unspecified atrial fibrillation: Secondary | ICD-10-CM | POA: Diagnosis present

## 2013-10-15 DIAGNOSIS — E46 Unspecified protein-calorie malnutrition: Secondary | ICD-10-CM

## 2013-10-15 DIAGNOSIS — I10 Essential (primary) hypertension: Secondary | ICD-10-CM | POA: Diagnosis present

## 2013-10-15 DIAGNOSIS — I5022 Chronic systolic (congestive) heart failure: Secondary | ICD-10-CM | POA: Diagnosis present

## 2013-10-15 DIAGNOSIS — E875 Hyperkalemia: Secondary | ICD-10-CM | POA: Diagnosis present

## 2013-10-15 DIAGNOSIS — R627 Adult failure to thrive: Secondary | ICD-10-CM | POA: Diagnosis present

## 2013-10-15 DIAGNOSIS — IMO0002 Reserved for concepts with insufficient information to code with codable children: Secondary | ICD-10-CM | POA: Insufficient documentation

## 2013-10-15 DIAGNOSIS — R630 Anorexia: Secondary | ICD-10-CM

## 2013-10-15 DIAGNOSIS — IMO0001 Reserved for inherently not codable concepts without codable children: Secondary | ICD-10-CM

## 2013-10-15 DIAGNOSIS — I5020 Unspecified systolic (congestive) heart failure: Secondary | ICD-10-CM

## 2013-10-15 DIAGNOSIS — R1312 Dysphagia, oropharyngeal phase: Secondary | ICD-10-CM | POA: Diagnosis present

## 2013-10-15 DIAGNOSIS — I69959 Hemiplegia and hemiparesis following unspecified cerebrovascular disease affecting unspecified side: Secondary | ICD-10-CM | POA: Diagnosis not present

## 2013-10-15 DIAGNOSIS — I69991 Dysphagia following unspecified cerebrovascular disease: Secondary | ICD-10-CM | POA: Diagnosis not present

## 2013-10-15 LAB — BASIC METABOLIC PANEL
BUN: 22 mg/dL (ref 6–23)
CHLORIDE: 109 meq/L (ref 96–112)
CO2: 23 meq/L (ref 19–32)
Calcium: 8.7 mg/dL (ref 8.4–10.5)
Creatinine, Ser: 0.92 mg/dL (ref 0.50–1.35)
GFR calc Af Amer: >90 mL/min (ref >90)
GFR calc non Af Amer: 87 mL/min — ABNORMAL LOW (ref >90)
GLUCOSE: 369 mg/dL — AB (ref 70–99)
Potassium: 4.4 mEq/L (ref 3.7–5.3)
SODIUM: 144 meq/L (ref 137–147)

## 2013-10-15 LAB — GLUCOSE, CAPILLARY
GLUCOSE-CAPILLARY: 109 mg/dL — AB (ref 70–99)
Glucose-Capillary: 273 mg/dL — ABNORMAL HIGH (ref 70–99)
Glucose-Capillary: 244 mg/dL — ABNORMAL HIGH (ref 70–99)
Glucose-Capillary: 357 mg/dL — ABNORMAL HIGH (ref 70–99)
Glucose-Capillary: 197 mg/dL — ABNORMAL HIGH (ref 70–99)

## 2013-10-15 LAB — HEMOGLOBIN A1C
HEMOGLOBIN A1C: 10 % — AB (ref <5.7)
MEAN PLASMA GLUCOSE: 240 mg/dL — AB (ref <117)

## 2013-10-15 MED ORDER — GLUCERNA SHAKE PO LIQD
237.0000 mL | Freq: Three times a day (TID) | ORAL | Status: DC
  Administered 2013-10-15 – 2013-10-17 (×5): 237 mL via ORAL
  Filled 2013-10-15 (×6): qty 237

## 2013-10-15 MED ORDER — RESOURCE THICKENUP CLEAR PO POWD
ORAL | Status: DC | PRN
  Filled 2013-10-15: qty 125

## 2013-10-15 MED ORDER — INSULIN GLARGINE 100 UNIT/ML ~~LOC~~ SOLN
10.0000 [IU] | Freq: Every day | SUBCUTANEOUS | Status: DC
  Administered 2013-10-16 – 2013-10-17 (×2): 10 [IU] via SUBCUTANEOUS
  Filled 2013-10-15 (×3): qty 0.1

## 2013-10-15 MED ORDER — INSULIN ASPART 100 UNIT/ML ~~LOC~~ SOLN
0.0000 [IU] | Freq: Three times a day (TID) | SUBCUTANEOUS | Status: DC
  Administered 2013-10-16 (×2): 4 [IU] via SUBCUTANEOUS
  Administered 2013-10-17: 3 [IU] via SUBCUTANEOUS

## 2013-10-15 MED ORDER — INSULIN ASPART 100 UNIT/ML ~~LOC~~ SOLN: 0.0000 [IU] | Freq: Every day | SUBCUTANEOUS | Status: DC

## 2013-10-15 MED ORDER — GLIPIZIDE 5 MG PO TABS
5.0000 mg | ORAL_TABLET | Freq: Two times a day (BID) | ORAL | Status: DC
  Administered 2013-10-15 – 2013-10-16 (×2): 5 mg via ORAL
  Filled 2013-10-15 (×5): qty 1

## 2013-10-15 MED ORDER — BIOTENE DRY MOUTH MT LIQD: 15.0000 mL | OROMUCOSAL | Status: DC | PRN

## 2013-10-15 NOTE — Progress Notes (Signed)
UR completed 

## 2013-10-15 NOTE — Evaluation (Signed)
Clinical/Bedside Swallow Evaluation Patient Details  Name: [name] MRN: [id number] Date of Birth: 04/29/1951  Today's Date: 10/15/2013 Time: 9892-1194 SLP Time Calculation (min): 20 min  Past Medical History:  Past Medical History  Diagnosis Date  . Active smoker   . Alcohol abuse, episodic   . Difficult intubation   . Hypertension   . Dysrhythmia   . COPD (chronic obstructive pulmonary disease)   . Shortness of breath   . Diabetes mellitus   . Peripheral vascular disease   . CHF (congestive heart failure)   . 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 Vascular surgery    . Leg amputation above knee      right  . Leg amputation below knee      left  . Laryngoscopy  12/27/2011    Procedure: LARYNGOSCOPY;  Surgeon: Christia Reading, MD;  Location: [facility] OR;  Service: ENT;  Laterality: N/A;  direct video laryngoscopy with evaluation of upper airway.reintubation.   HPI:  This is a 63 y.o. year old male with multiple medical problems including prior history aphasia status post massive CVA approximately 8 years ago, insulin-dependent diabetes, failure to thrive,atrial fibrillation, dysphasia presenting with decreased appetite. Patient's sister and health care power of attorney states the patient has had decreased appetite over the past 2-3 days which is wall for . Sister states the patient normally eats a good portion of 3 meals per day. Denies any fevers or chills. No recent falls. No episodes of vomiting or diarrhea. Sugars at home have been in the 100s.   Assessment / Plan / Recommendation Clinical Impression   Pt exhibits oropharyngeal dysphagia characterized by suspected delay in the initiation of the swallow with all consistencies, oral holding and decreased lingual manipulation/coordination with all consistencies; s/s of aspiration indicated with thin liquids as pt exhibited a wet vocal  quality and multiple swallows, along with a delayed throat clearing response; nectar-thickened liquids via cup with small sips eliminated delayed throat clearing and wet vocal quality; nursing informed SLP family member stated "he didn't have difficulty at home with thin liquids", but with s/s of aspiration noted at bedside, a MBS would be beneficial to r/o aspiration at this time and recommend any compensatory strategies that may be helpful during meal consumption.  Prolonged mastication noted with solid consistency with lingual residue noted primarily on right and midline.  Liquid wash cleared majority of lingual residue.      Aspiration Risk  Mild    Diet Recommendation Dysphagia 2 (Fine chop);Nectar-thick liquid   Liquid Administration via: Cup;No straw Medication Administration: Crushed with puree Supervision: Staff to assist with self feeding Compensations: Slow rate;Small sips/bites;Check for pocketing Postural Changes and/or Swallow Maneuvers: Seated upright 90 degrees;Upright 30-60 min after meal    Other  Recommendations Recommended Consults: MBS Oral Care Recommendations: Oral care BID Other Recommendations: Order thickener from pharmacy   Follow Up Recommendations  Home health SLP    Frequency and Duration min 2x/week  1 week   Pertinent Vitals/Pain WDL    SLP Swallow Goals  Pending MBS   Swallow Study Prior Functional Status   Dependent with ADL's; hx of dysphagia    General Date of Onset: 10/14/13 HPI: This is a 63 y.o. year old male with multiple medical problems including prior history aphasia status post massive CVA approximately 8 years ago, insulin-dependent diabetes, failure to thrive,atrial fibrillation, dysphasia  presenting with decreased appetite. Patient's sister and health care power of attorney states the patient has had decreased appetite over the past 2-3 days which is wall for . Sister states the patient normally eats a good portion of 3 meals per day.  Denies any fevers or chills. No recent falls. No episodes of vomiting or diarrhea. Sugars at home have been in the 100s. Type of Study: Bedside swallow evaluation Diet Prior to this Study: Information not available;Other (Comment) (Hx indicated dysphagia) Temperature Spikes Noted: No Respiratory Status: Room air Behavior/Cognition: Alert;Requires cueing;Doesn't follow directions;Other (comment) (global aphasia) Oral Cavity - Dentition: Poor condition Self-Feeding Abilities: Needs assist;Needs set up Patient Positioning: Upright in bed Baseline Vocal Quality: Low vocal intensity;Other (comment) (difficulty following directions to vocalize) Volitional Cough: Cognitively unable to elicit Volitional Swallow: Unable to elicit    Oral/Motor/Sensory Function Overall Oral Motor/Sensory Function: Impaired at baseline Labial ROM: Reduced right Labial Symmetry: Abnormal symmetry right Labial Strength: Reduced Labial Sensation: Reduced Lingual ROM: Other (Comment) (unable to assess fully) Lingual Symmetry: Other (Comment) (u/a to assess) Lingual Strength: Reduced Facial ROM: Reduced right Facial Symmetry: Right droop Facial Strength: Reduced Facial Sensation: Reduced Velum:  (u/a to assess)   Ice Chips Ice chips: Not tested   Thin Liquid Thin Liquid: Impaired Presentation: Cup;Spoon Oral Phase Impairments: Impaired anterior to posterior transit;Reduced lingual movement/coordination Oral Phase Functional Implications: Prolonged oral transit Pharyngeal  Phase Impairments: Suspected delayed Swallow;Wet Vocal Quality;Multiple swallows;Throat Clearing - Delayed    Nectar Thick Nectar Thick Liquid: Impaired Presentation: Cup Oral Phase Impairments: Reduced lingual movement/coordination;Impaired anterior to posterior transit Oral phase functional implications: Prolonged oral transit Pharyngeal Phase Impairments: Suspected delayed Swallow   Honey Thick Honey Thick Liquid: Not tested   Puree Puree:  Impaired Presentation: Spoon Oral Phase Impairments: Impaired anterior to posterior transit;Reduced lingual movement/coordination Oral Phase Functional Implications: Prolonged oral transit Pharyngeal Phase Impairments: Suspected delayed Swallow;Multiple swallows   Solid       Solid: Impaired Presentation: Self Fed Oral Phase Impairments: Reduced lingual movement/coordination;Impaired anterior to posterior transit Oral Phase Functional Implications: Right lateral sulci pocketing;Oral residue Pharyngeal Phase Impairments: Suspected delayed Swallow;Multiple swallows       Rod MaePatricia A Adams, M.S., CCC-SLP 10/15/2013,10:49 AM

## 2013-10-15 NOTE — Progress Notes (Signed)
Chart reviewed.   [facility] PROGRESS NOTE  [name] GGY:[id number] DOB: 1950/12/16 DOA: 10/14/2013 PCP: Reather Converse, PA-C  Assessment/Plan:  Principal Problem:   DM (diabetes mellitus), type 2, uncontrolled/hyperosmolar nonketotic state: Blood glucoses remain high. Will increase sliding scale, and increase Lantus. Resume glipizide. In Await hemoglobin A1c. Likely contributing to his poor appetite over the past few days. Active Problems:   Atrial fibrillation: On Eliquis   History of 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: Cared for at home by family members   Unilateral AKA, right   Unilateral complete BKA, left   Protein calorie malnutrition:  Health shakes Dehydration: Continue IV fluids but watch for fluid overload. Patient has a history of chronic systolic heart failure. Fair to thrive likely related to dehydration and HONK Chronic systolic heart failure: Ejection fraction in 2013 25-30% Hyperkalemia resolved.  Code Status:  DO NOT RESUSCITATE Family Communication:   Disposition Plan:  home  Consultants:  None  Procedures:   None  Antibiotics:  None  HPI/Subjective: No reported problems. Patient is nonverbal  Objective: Filed Vitals:   10/15/13 0345  BP:   Pulse: 66  Temp:   Resp:     Intake/Output Summary (Last 24 hours) at 10/15/13 1401 Last data filed at 10/15/13 0900  Gross per 24 hour  Intake 1873.33 ml  Output      0 ml  Net 1873.33 ml   Filed Weights   10/14/13 1819 10/14/13 2308  Weight: 49.896 kg (110 lb) 45.5 kg (100 lb 5 oz)    Exam:   General:  Alert. Attempts to communicate with nodding and hand gestures, but answers not necessarily consistent.  Cardiovascular: Regular rate rhythm without murmurs gallops rubs  Respiratory: Clear to auscultation bilaterally without wheezes rhonchi or rales  Abdomen: Soft nontender nondistended  Ext: Bilateral amputations.  Neurologic:  Nonverbal. Right side  weakness is chronic.  Basic Metabolic Panel:  Recent Labs Lab 10/14/13 1828 10/14/13 2230 10/15/13 1110  NA 137  --  144  K 5.4*  --  4.4  CL 101  --  109  CO2 19  --  23  GLUCOSE 497*  --  369*  BUN 29*  --  22  CREATININE 1.05 0.99 0.92  CALCIUM 9.4  --  8.7   Liver Function Tests:  Recent Labs Lab 10/14/13 1828  AST 29  ALT 63*  ALKPHOS 5*  BILITOT 0.7  PROT 7.5  ALBUMIN 0.2*   No results found for this basename: LIPASE, AMYLASE,  in the last 168 hours No results found for this basename: AMMONIA,  in the last 168 hours CBC:  Recent Labs Lab 10/14/13 1855 10/14/13 2230  WBC 11.3* 9.9  HGB 16.3 15.2  HCT 47.2 45.7  MCV 88.6 89.6  PLT 203 193   Cardiac Enzymes: No results found for this basename: CKTOTAL, CKMB, CKMBINDEX, TROPONINI,  in the last 168 hours BNP (last 3 results) No results found for this basename: PROBNP,  in the last 8760 hours CBG:  Recent Labs Lab 10/14/13 1905 10/14/13 2145 10/14/13 2310 10/15/13 0655 10/15/13 1139  GLUCAP 482* 392* 273* 244* 357*    No results found for this or any previous visit (from the past 240 hour(s)).   Studies: X-ray Chest Pa And Lateral   10/15/2013   CLINICAL DATA:  Decreased appetite and failure to thrive.  EXAM: CHEST - 2 VIEW  COMPARISON:  10/14/2013  FINDINGS: Stable COPD. There is no evidence of pulmonary edema, consolidation, pneumothorax,  nodule or pleural fluid. Stable mild cardiomegaly. The bony thorax is unremarkable.  IMPRESSION: Stable COPD and mild cardiomegaly.   Electronically Signed   By: Irish LackGlenn  Yamagata M.D.   On: 10/15/2013 09:00   Ct Head Wo Contrast  10/14/2013   CLINICAL DATA:  Decreased oral intake for several days.  , weakness  EXAM: CT HEAD WITHOUT CONTRAST  TECHNIQUE: Contiguous axial images were obtained from the base of the skull through the vertex without intravenous contrast.  COMPARISON:  CT HEAD W/O CM dated 12/18/2012  FINDINGS: There is a large area of stable  encephalomalacia in the left frontal and anterior parietal lobes. There is stable mild-to-moderate diffuse cerebral and cerebellar atrophy with compensatory ventriculomegaly. There is no evidence of an acute intracranial hemorrhage nor of an evolving ischemic infarction. The cerebellum and brainstem exhibit no acute abnormalities.  At bone window settings the observed portions of the paranasal sinuses and mastoid air cells are clear. There is no evidence of an acute skull fracture.  IMPRESSION: 1. There are extensive stable chronic changes in the left frontal and anterior parietal lobes consistent with previous ischemic insult. 2. There is no evidence of an acute ischemic or hemorrhagic event. 3. There is stable mild to moderate diffuse cerebral and cerebellar atrophy with compensatory ventriculomegaly.   Electronically Signed   By: David  [place]   On: 10/14/2013 20:07   Dg Chest Portable 1 View  10/14/2013   CLINICAL DATA:  Cough  EXAM: PORTABLE CHEST - 1 VIEW  COMPARISON:  DG CHEST 2 VIEW dated 12/18/2012  FINDINGS: The lungs are adequately inflated. There is no focal infiltrate. There is no pleural effusion or pneumothorax. The cardiopericardial silhouette is top-normal in size. The pulmonary vascularity is not engorged. The observed portions of the bony thorax appear normal.  IMPRESSION: There is no evidence of CHF nor pneumonia nor other acute cardiopulmonary disease.   Electronically Signed   By: David  [place]   On: 10/14/2013 19:24    Scheduled Meds: . apixaban  5 mg Oral BID  . digoxin  0.125 mg Oral q morning - 10a  . insulin aspart  0-9 Units Subcutaneous 6 times per day  . insulin glargine  4 Units Subcutaneous QHS  . lisinopril  20 mg Oral q morning - 10a  . metoprolol tartrate  25 mg Oral BID  . simvastatin  10 mg Oral q1800  . sodium chloride  3 mL Intravenous Q12H  . Vitamin D (Ergocalciferol)  50,000 Units Oral Q7 days   Continuous Infusions: . sodium chloride 100 mL/hr at 10/15/13  0028    Time spent: 35 minutes  Christiane Haorinna L Jeraldine Primeau  [facility] Pager [phone]. If 7PM-7AM, please contact night-coverage at [email or web address], password [email or web address] 10/15/2013, 2:01 PM  LOS: 1 day

## 2013-10-15 NOTE — Progress Notes (Signed)
Inpatient Diabetes Program Recommendations  AACE/ADA: New Consensus Statement on Inpatient Glycemic Control (2013)  Target Ranges:  Prepandial:   less than 140 mg/dL      Peak postprandial:   less than 180 mg/dL (1-2 hours)      Critically ill patients:  140 - 180 mg/dL   Inpatient Diabetes Program Recommendations Insulin - Basal: Most probably needs at least 10 units (vs 4 units) Correction (SSI): Please increase to moderate and change to tidwc and add the HS scale. (Rather than add home Glipizide)  Thank you, Lenor Coffin, RN, CNS, Diabetes Coordinator [phone])

## 2013-10-16 ENCOUNTER — Inpatient Hospital Stay (HOSPITAL_COMMUNITY): Payer: PRIVATE HEALTH INSURANCE

## 2013-10-16 DIAGNOSIS — G9340 Encephalopathy, unspecified: Secondary | ICD-10-CM

## 2013-10-16 DIAGNOSIS — E43 Unspecified severe protein-calorie malnutrition: Secondary | ICD-10-CM | POA: Insufficient documentation

## 2013-10-16 LAB — GLUCOSE, CAPILLARY
GLUCOSE-CAPILLARY: 160 mg/dL — AB (ref 70–99)
GLUCOSE-CAPILLARY: 153 mg/dL — AB (ref 70–99)
Glucose-Capillary: 129 mg/dL — ABNORMAL HIGH (ref 70–99)
Glucose-Capillary: 168 mg/dL — ABNORMAL HIGH (ref 70–99)
Glucose-Capillary: 175 mg/dL — ABNORMAL HIGH (ref 70–99)
Glucose-Capillary: 93 mg/dL (ref 70–99)

## 2013-10-16 LAB — URINE CULTURE
Colony Count: NO GROWTH
Culture: NO GROWTH

## 2013-10-16 NOTE — Progress Notes (Signed)
INITIAL NUTRITION ASSESSMENT  DOCUMENTATION CODES Per approved criteria  -Severe malnutrition in the context of chronic illness -Underweight   INTERVENTION:  Continue Glucerna Shake po TID, each supplement provides 220 kcal and 10 grams of protein RD to follow for nutrition care plan  NUTRITION DIAGNOSIS: Increased nutrient needs related to wound healing, malnutrition as evidenced by estimated nutrition needs  Goal: Pt to meet >/= 90% of their estimated nutrition needs   Monitor:  PO & supplemental intake, weight, labs, I/O's  Reason for Assessment: Consult (diet education), Low Braden  63 y.o. male  Admitting Dx: DM (diabetes mellitus), type 2, uncontrolled  ASSESSMENT: 63 y.o. year old male with multiple medical problems including prior history aphasia status post massive CVA, DM, failure to thrive, atrial fibrillation, dysphasia presenting with decreased appetite. Presented to  ER ith an initial glucose of 497. Bicarbonate 19. Potassium 5.4. White blood cell count 11.3. Hemoglobin 16.3.head CT showed chronic changes consistent with previous show with no evidence of acute event.chest X-ray negative for CHF or pneumonia.urinalysis with greater than 1000 glucose but no leukocytosis.  RD spoke with patient's sister at bedside; pt usually has a good appetite and consumes 3 meals per day; dinks Glucerna Shakes as well; + severe muscle loss to upper body; s/p MBSS today -- SLP recommending Dys 2, nectar-thick liquid diet; PO intake currently at 25-50% per flowsheet records; Glucerna Shake orders in place TID.   Low braden score places patient at risk for further skin breakdown.  Nutrition Focused Physical Exam:  Subcutaneous Fat:  Orbital Region: N/A Upper Arm Region: severe depeletion Thoracic and Lumbar Region: N/A  Muscle:  Temple Region: mild to moderate depletion Clavicle Bone Region: severe depletion Clavicle and Acromion Bone Region: severe depletion Scapular Bone  Region: N/A Dorsal Hand: N/A Patellar Region: N/A Anterior Thigh Region: N/A Posterior Calf Region: N/A  Edema: none  Patient meets criteria for severe malnutrition in the context of chronic illness as evidenced by severe muscle loss and severe subcutaneous fat loss.  Height: 6'  (1.829 m)    Weight: Wt Readings from Last 1 Encounters:  10/16/13 104 lb 15 oz (47.6 kg)    Ideal Body Weight: 149 lb -- adjusted for amputations   % Ideal Body Weight: 70%  Wt Readings from Last 10 Encounters:  10/16/13 104 lb 15 oz (47.6 kg)  12/19/12 106 lb 7.7 oz (48.3 kg)  01/01/12 108 lb 3.9 oz (49.1 kg)  01/01/12 108 lb 3.9 oz (49.1 kg)    Usual Body Weight: 106 lb  % Usual Body Weight: 98%  BMI: 16.7 kg/m2  -- adjusted for amputations  Estimated Nutritional Needs: Kcal: 1400-1600 Protein: 70-80 gm Fluid: >/= 1.5 L  Skin: Stage II pressure ulcer to buttocks  Diet Order: Dysphagia  EDUCATION NEEDS: -Education needs addressed -- provided "Carbohydrate Counting for People with Diabetes" handouts from [employer] of [employer]etics   Intake/Output Summary (Last 24 hours) at 10/16/13 1348 Last data filed at 10/16/13 1246  Gross per 24 hour  Intake    580 ml  Output    425 ml  Net    155 ml    Labs:   Recent Labs Lab 10/14/13 1828 10/14/13 2230 10/15/13 1110  NA 137  --  144  K 5.4*  --  4.4  CL 101  --  109  CO2 19  --  23  BUN 29*  --  22  CREATININE 1.05 0.99 0.92  CALCIUM 9.4  --  8.7  GLUCOSE 497*  --  369*    CBG (last 3)   Recent Labs  10/16/13 0426 10/16/13 0631 10/16/13 1236  GLUCAP 160* 153* 175*    Scheduled Meds: . apixaban  5 mg Oral BID  . digoxin  0.125 mg Oral q morning - 10a  . feeding supplement (GLUCERNA SHAKE)  237 mL Oral TID BM  . insulin aspart  0-20 Units Subcutaneous TID WC  . insulin aspart  0-5 Units Subcutaneous QHS  . insulin glargine  10 Units Subcutaneous QHS  . lisinopril  20 mg Oral q morning - 10a  . metoprolol  tartrate  25 mg Oral BID  . simvastatin  10 mg Oral q1800  . sodium chloride  3 mL Intravenous Q12H  . Vitamin D (Ergocalciferol)  50,000 Units Oral Q7 days    Continuous Infusions: . sodium chloride 50 mL/hr (10/16/13 0057)    Past Medical History  Diagnosis Date  . Active smoker   . Alcohol abuse, episodic   . Difficult intubation   . Hypertension   . Dysrhythmia   . COPD (chronic obstructive pulmonary disease)   . Shortness of breath   . Diabetes mellitus   . Peripheral vascular disease   . CHF (congestive heart failure)   . Stroke     has expressive aphasia, weak on Rt. side    Past Surgical History  Procedure Laterality Date  . Vascular surgery    . Leg amputation above knee      right  . Leg amputation below knee      left  . Laryngoscopy  12/27/2011    Procedure: LARYNGOSCOPY;  Surgeon: Christia Readingwight Bates, MD;  [facility];  Service: ENT;  Laterality: N/A;  direct video laryngoscopy with evaluation of upper airway.reintubation.    Maureen ChattersKatie Bodhi Stenglein, RD, LDN Pager #: [phone] After-Hours Pager #: [phone]

## 2013-10-16 NOTE — Progress Notes (Signed)
Inpatient Diabetes Program Recommendations  AACE/ADA: New Consensus Statement on Inpatient Glycemic Control (2013)  Target Ranges:  Prepandial:   less than 140 mg/dL      Peak postprandial:   less than 180 mg/dL (1-2 hours)      Critically ill patients:  140 - 180 mg/dL  Inpatient Diabetes Program Recommendations Insulin - Basal: Most probably needs at least 10 units (vs 4 units) Correction (SSI): Noted correction increased to resistant and better glucose control is result.  Pt needs to use this scale at home as well and/or add meal coverage of 4 units tidwc without correction.  Referral received regarding teaching pt's sister who is caregiver tp pt at home.  I ordered a dietician consult to help them understand meal patterns/planning. Would expect pt to be discharged on Lantus 10 units and a moderate to resistant correction scale tidwc. Telephoned pt's sister to set up a time to meet with her; left her a msg with contact information to get back to me.  Will revisit in the am.  Thank you, Lenor Coffin, RN, CNS, Diabetes Coordinator [phone])

## 2013-10-16 NOTE — Progress Notes (Signed)
Inpatient Diabetes Program Recommendations  AACE/ADA: New Consensus Statement on Inpatient Glycemic Control (2013)  Target Ranges:  Prepandial:   less than 140 mg/dL      Peak postprandial:   less than 180 mg/dL (1-2 hours)      Critically ill patients:  140 - 180 mg/dL   Inpatient Diabetes Program Recommendations Insulin - Basal: Most probably needs at least 10 units (vs 4 units) Correction (SSI): Noted correction increased to resistant and better glucose control is result.  Pt needs to use this scale at home as well and/or add meal coverage of 4 units tidwc without correction. Lantus increased to 10 units, thank you.  Thank you, Lenor Coffin, RN, CNS, Diabetes Coordinator [phone])

## 2013-10-16 NOTE — Care Management Note (Signed)
    Page 1 of 1   10/16/2013     2:08:24 PM CARE MANAGEMENT NOTE 10/16/2013  Patient:  LIZBETH, WODRICH   Account Number:  [id number]  Date Initiated:  10/16/2013  Documentation initiated by:  [facility]  Subjective/Objective Assessment:   Admitted with hyperglycemia     Action/Plan:   Anticipated DC Date:  10/17/2013   Anticipated DC Plan:  HOME W HOME HEALTH SERVICES      DC Planning Services  CM consult      Choice offered to / List presented to:             Status of service:  Completed, signed off Medicare Important Message given?   (If response is "NO", the following Medicare IM given date fields will be blank) Date Medicare IM given:   Date Additional Medicare IM given:    Discharge Disposition:  HOME/SELF CARE  Per UR Regulation:  Reviewed for med. necessity/level of care/duration of stay  If discussed at Long Length of Stay Meetings, dates discussed:    Comments:  10-16-13 Dana Allan, RNBSN [phone] Lives with sister who cares for him.  Talked with sister Olegario Messier who is PCG of brother.  Is approved with [employer] for 35 hours a week which is the sister. Would like another bed.  States first bed came from [facility] and broke and they never would fix it so went to a medical supply company and got another bed which is broken.  State have called that company to fix bed but haven't done anything yet.  Would just like a new bed like this on in hospital. Explained that you can only get a new bed every 5 years through insurance and that the home beds are not as "fancy" as the ones in the hospital. Agreeable to get another bed through [facility].  Referral made to [facility]  Found out that [facility] supplied them with a bed in 2012 and [facility] placed a ticket to get is fixed.  Communicated to family.

## 2013-10-16 NOTE — Procedures (Signed)
Objective Swallowing Evaluation: Modified Barium Swallowing Study  Patient Details  Name: [name] MRN: [id number] Date of Birth: 07-31-1950  Today's Date: 10/16/2013 Time: 4259-5638 SLP Time Calculation (min): 25 min  Past Medical History:  Past Medical History  Diagnosis Date  . Active smoker   . Alcohol abuse, episodic   . Difficult intubation   . Hypertension   . Dysrhythmia   . COPD (chronic obstructive pulmonary disease)   . Shortness of breath   . Diabetes mellitus   . Peripheral vascular disease   . CHF (congestive heart failure)   . 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 Vascular surgery    . Leg amputation above knee      right  . Leg amputation below knee      left  . Laryngoscopy  12/27/2011    Procedure: LARYNGOSCOPY;  Surgeon: Christia Reading, MD;  Location: [facility] OR;  Service: ENT;  Laterality: N/A;  direct video laryngoscopy with evaluation of upper airway.reintubation.   HPI:  This is a 63 y.o. year old male with multiple medical problems including prior history of dysphagia and aphasia status post massive CVA approximately 8 years ago, insulin-dependent diabetes, failure to thrive,atrial fibrillation, dysphasia presenting with decreased appetite. Patient's sister and health care power of attorney states the patient has had decreased appetite over the past 2-3 days which is wall for . Sister states the patient normally eats a good portion of 3 meals per day. Denies any fevers or chills. No recent falls. No episodes of vomiting or diarrhea. Sugars at home have been in the 100s. Pt with MBS following cervical injury in 2014 with severe oropharyngeal dysphagia, recommended to be NPO.      Assessment / Plan / Recommendation Clinical Impression  Dysphagia Diagnosis: Mild oral phase dysphagia;Moderate pharyngeal phase dysphagia Clinical impression: Pt demonstrates a  moderate oropharyngeal dysphagia with a significnalty delayed swallow response and moderate base of tongue and pharyngeal weakness. Pt silently aspirates thin liquids pooled in pyriforms as swallow is initiated. Nectar thick liquids are only significantly penetrated with overlarge, impulsive boluses. There are moderate vallecular and pharyngeal residuals present post swallow that mostly clear with independent second swallow. Pt is unable to follow cueing for compensatory strategies due to language impairment. He also does not have a reliable cough response or sensation of penetrate (he did have a late cough unrelated to aspiration or esophageal function). Recommend Dys 2 diet with nectar thick liquids with moderate risk of aspiration with any POs due to residuals. SLP to f/u for tolerance.     Treatment Recommendation  Therapy as outlined in treatment plan below    Diet Recommendation Dysphagia 2 (Fine chop);Nectar-thick liquid   Liquid Administration via: Cup;Spoon;Straw (supervision/assist for small sips) Medication Administration: Whole meds with puree Supervision: Staff to assist with self feeding Compensations: Slow rate;Small sips/bites;Check for pocketing Postural Changes and/or Swallow Maneuvers: Seated upright 90 degrees;Upright 30-60 min after meal    Other  Recommendations Oral Care Recommendations: Oral care BID Other Recommendations: Order thickener from pharmacy   Follow Up Recommendations  Home health SLP    Frequency and Duration min 2x/week  2 weeks   Pertinent Vitals/Pain NA    SLP Swallow Goals     General HPI: This is a 63 y.o. year old male with multiple medical problems including prior history of dysphagia and aphasia status post massive  CVA approximately 8 years ago, insulin-dependent diabetes, failure to thrive,atrial fibrillation, dysphasia presenting with decreased appetite. Patient's sister and health care power of attorney states the patient has had decreased  appetite over the past 2-3 days which is wall for . Sister states the patient normally eats a good portion of 3 meals per day. Denies any fevers or chills. No recent falls. No episodes of vomiting or diarrhea. Sugars at home have been in the 100s. Pt with MBS following cervical injury in 2014 with severe oropharyngeal dysphagia, recommended to be NPO.  Type of Study: Modified Barium Swallowing Study Reason for Referral: Objectively evaluate swallowing function Previous Swallow Assessment: see HPI Diet Prior to this Study: Dysphagia 2 (chopped);Nectar-thick liquids Temperature Spikes Noted: No Respiratory Status: Room air History of Recent Intubation: No Behavior/Cognition: Alert;Doesn't follow directions Oral Cavity - Dentition: Poor condition Oral Motor / Sensory Function: Impaired motor;Impaired sensory Oral impairment: Right lingual;Right facial;Right labial Self-Feeding Abilities: Able to feed self;Needs assist Patient Positioning: Upright in chair Baseline Vocal Quality:  (unable to vocalize on command) Volitional Cough: Cognitively unable to elicit Volitional Swallow: Unable to elicit Anatomy: Within functional limits Pharyngeal Secretions: Not observed secondary MBS    Reason for Referral Objectively evaluate swallowing function   Oral Phase Oral Preparation/Oral Phase Oral Phase: Impaired Oral - Nectar Oral - Nectar Teaspoon: Delayed oral transit Oral - Nectar Cup: Delayed oral transit Oral - Nectar Straw: Delayed oral transit Oral - Thin Oral - Thin Cup: Delayed oral transit Oral - Solids Oral - Puree: Delayed oral transit Oral - Mechanical Soft: Delayed oral transit   Pharyngeal Phase Pharyngeal Phase Pharyngeal Phase: Impaired Pharyngeal - Nectar Pharyngeal - Nectar Teaspoon: Delayed swallow initiation;Reduced pharyngeal peristalsis;Reduced epiglottic inversion;Reduced anterior laryngeal mobility;Reduced tongue base retraction;Pharyngeal residue - valleculae;Pharyngeal  residue - pyriform sinuses Pharyngeal - Nectar Cup: Delayed swallow initiation;Reduced pharyngeal peristalsis;Reduced epiglottic inversion;Reduced anterior laryngeal mobility;Reduced tongue base retraction;Pharyngeal residue - valleculae;Pharyngeal residue - pyriform sinuses;Penetration/Aspiration during swallow;Penetration/Aspiration after swallow Penetration/Aspiration details (nectar cup): Material enters airway, CONTACTS cords and not ejected out;Material enters airway, remains ABOVE vocal cords then ejected out;Material does not enter airway Pharyngeal - Nectar Straw: Delayed swallow initiation;Reduced pharyngeal peristalsis;Reduced epiglottic inversion;Reduced anterior laryngeal mobility;Reduced tongue base retraction;Pharyngeal residue - valleculae;Pharyngeal residue - pyriform sinuses Pharyngeal - Thin Pharyngeal - Thin Cup: Delayed swallow initiation;Reduced pharyngeal peristalsis;Reduced epiglottic inversion;Reduced anterior laryngeal mobility;Reduced tongue base retraction;Pharyngeal residue - valleculae;Pharyngeal residue - pyriform sinuses;Penetration/Aspiration during swallow Penetration/Aspiration details (thin cup): Material enters airway, passes BELOW cords without attempt by patient to eject out (silent aspiration) Pharyngeal - Solids Pharyngeal - Puree: Delayed swallow initiation;Reduced pharyngeal peristalsis;Reduced epiglottic inversion;Reduced anterior laryngeal mobility;Reduced tongue base retraction;Pharyngeal residue - valleculae;Pharyngeal residue - pyriform sinuses Pharyngeal - Mechanical Soft: Delayed swallow initiation;Reduced pharyngeal peristalsis;Reduced epiglottic inversion;Reduced anterior laryngeal mobility;Reduced tongue base retraction;Pharyngeal residue - valleculae;Pharyngeal residue - pyriform sinuses  Cervical Esophageal Phase    GO             Harlon DittyBonnie Roddie Riegler, MA CCC-SLP [phone]  Riley NearingBonnie Caroline Lorena Benham 10/16/2013, 12:51 PM

## 2013-10-16 NOTE — Progress Notes (Signed)
Chart reviewed.   [facility] PROGRESS NOTE  BELEN LALLO IOE:[id number] DOB: 1951/03/03 DOA: 10/14/2013 PCP: Reather Converse, PA-C  Assessment/Plan:     DM (diabetes mellitus), type 2, uncontrolled/hyperosmolar nonketotic state: Blood glucoses remain high. Will increase sliding scale, and increase Lantus.  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 Patient has a history of chronic systolic heart failure. Fair to thrive likely related to dehydration and HONK Chronic systolic heart failure: Ejection fraction in 2013 25-30% Hyperkalemia resolved.  FAMILY DOES NOT WANT HOME HEALTH   Code Status:  DO NOT RESUSCITATE Family Communication:   Disposition Plan:  home  Consultants:  None  Procedures:   None  Antibiotics:  None  HPI/Subjective: No reported problems. Patient is nonverbal  Objective: Filed Vitals:   10/16/13 1033  BP: 150/86  Pulse: 63  Temp:   Resp:     Intake/Output Summary (Last 24 hours) at 10/16/13 1059 Last data filed at 10/16/13 1034  Gross per 24 hour  Intake    480 ml  Output    425 ml  Net     55 ml   Filed Weights   10/14/13 1819 10/14/13 2308 10/16/13 0628  Weight: 49.896 kg (110 lb) 45.5 kg (100 lb 5 oz) 47.6 kg (104 lb 15 oz)    Exam:   General:  Alert. Does not speak  Cardiovascular: Regular rate rhythm without murmurs gallops rubs  Respiratory: Clear to auscultation bilaterally without wheezes rhonchi or rales  Abdomen: Soft nontender nondistended  Ext: Bilateral amputations.  Neurologic:  Nonverbal. Right side weakness is chronic.  Basic Metabolic Panel:  Recent Labs Lab 10/14/13 1828 10/14/13 2230 10/15/13 1110  NA 137   --  144  K 5.4*  --  4.4  CL 101  --  109  CO2 19  --  23  GLUCOSE 497*  --  369*  BUN 29*  --  22  CREATININE 1.05 0.99 0.92  CALCIUM 9.4  --  8.7   Liver Function Tests:  Recent Labs Lab 10/14/13 1828  AST 29  ALT 63*  ALKPHOS 5*  BILITOT 0.7  PROT 7.5  ALBUMIN 0.2*   No results found for this basename: LIPASE, AMYLASE,  in the last 168 hours No results found for this basename: AMMONIA,  in the last 168 hours CBC:  Recent Labs Lab 10/14/13 1855 10/14/13 2230  WBC 11.3* 9.9  HGB 16.3 15.2  HCT 47.2 45.7  MCV 88.6 89.6  PLT 203 193   Cardiac Enzymes: No results found for this basename: CKTOTAL, CKMB, CKMBINDEX, TROPONINI,  in the last 168 hours BNP (last 3 results) No results found for this basename: PROBNP,  in the last 8760 hours CBG:  Recent Labs Lab 10/15/13 1623 10/15/13 2027 10/16/13 0023 10/16/13 0426 10/16/13 0631  GLUCAP 109* 197* 168* 160* 153*    Recent Results (from the past 240 hour(s))  URINE CULTURE     Status: None   Collection Time    10/14/13  7:13 PM      Result Value Ref Range Status   Specimen Description URINE, RANDOM   Final   Special Requests ADDED 0320 10/15/13   Final   Culture  Setup Time  Final   Value: 10/15/2013 03:24     Performed at [facility]   Colony Count     Final   Value: NO GROWTH     [facility]formed at [facility]   Culture     Final   Value: NO GROWTH     Performed at [facility]   Report Status 10/16/2013 FINAL   Final     Studies: X-ray Chest Pa And Lateral   10/15/2013   CLINICAL DATA:  Decreased appetite and failure to thrive.  EXAM: CHEST - 2 VIEW  COMPARISON:  10/14/2013  FINDINGS: Stable COPD. There is no evidence of pulmonary edema, consolidation, pneumothorax, nodule or pleural fluid. Stable mild cardiomegaly. The bony thorax is unremarkable.  IMPRESSION: Stable COPD and mild cardiomegaly.   Electronically Signed   By: Irish LackGlenn  Yamagata M.D.   On: 10/15/2013 09:00   Ct Head  Wo Contrast  10/14/2013   CLINICAL DATA:  Decreased oral intake for several days.  , weakness  EXAM: CT HEAD WITHOUT CONTRAST  TECHNIQUE: Contiguous axial images were obtained from the base of the skull through the vertex without intravenous contrast.  COMPARISON:  CT HEAD W/O CM dated 12/18/2012  FINDINGS: There is a large area of stable encephalomalacia in the left frontal and anterior parietal lobes. There is stable mild-to-moderate diffuse cerebral and cerebellar atrophy with compensatory ventriculomegaly. There is no evidence of an acute intracranial hemorrhage nor of an evolving ischemic infarction. The cerebellum and brainstem exhibit no acute abnormalities.  At bone window settings the observed portions of the paranasal sinuses and mastoid air cells are clear. There is no evidence of an acute skull fracture.  IMPRESSION: 1. There are extensive stable chronic changes in the left frontal and anterior parietal lobes consistent with previous ischemic insult. 2. There is no evidence of an acute ischemic or hemorrhagic event. 3. There is stable mild to moderate diffuse cerebral and cerebellar atrophy with compensatory ventriculomegaly.   Electronically Signed   By: David  [place]azil[place]Jordan   On: 10/14/2013 20:07   Dg Chest Portable 1 View  10/14/2013   CLINICAL DATA:  Cough  EXAM: PORTABLE CHEST - 1 VIEW  COMPARISON:  DG CHEST 2 VIEW dated 12/18/2012  FINDINGS: The lungs are adequately inflated. There is no focal infiltrate. There is no pleural effusion or pneumothorax. The cardiopericardial silhouette is top-normal in size. The pulmonary vascularity is not engorged. The observed portions of the bony thorax appear normal.  IMPRESSION: There is no evidence of CHF nor pneumonia nor other acute cardiopulmonary disease.   Electronically Signed   By: David  [place]   On: 10/14/2013 19:24    Scheduled Meds: . apixaban  5 mg Oral BID  . digoxin  0.125 mg Oral q morning - 10a  . feeding supplement (GLUCERNA SHAKE)  237 mL  Oral TID BM  . insulin aspart  0-20 Units Subcutaneous TID WC  . insulin aspart  0-5 Units Subcutaneous QHS  . insulin glargine  10 Units Subcutaneous QHS  . lisinopril  20 mg Oral q morning - 10a  . metoprolol tartrate  25 mg Oral BID  . simvastatin  10 mg Oral q1800  . sodium chloride  3 mL Intravenous Q12H  . Vitamin D (Ergocalciferol)  50,000 Units Oral Q7 days   Continuous Infusions: . sodium chloride 50 mL/hr (10/16/13 0057)    Time spent: 25 minutes  Joseph ArtJessica U Vann  [facility] Pager [phone]. If 7PM-7AM, please contact night-coverage at [email or web address],  password [email or web address] 10/16/2013, 10:59 AM  LOS: 2 days

## 2013-10-17 DIAGNOSIS — I10 Essential (primary) hypertension: Secondary | ICD-10-CM

## 2013-10-17 DIAGNOSIS — R1310 Dysphagia, unspecified: Secondary | ICD-10-CM

## 2013-10-17 LAB — GLUCOSE, CAPILLARY
GLUCOSE-CAPILLARY: 141 mg/dL — AB (ref 70–99)
GLUCOSE-CAPILLARY: 139 mg/dL — AB (ref 70–99)

## 2013-10-17 MED ORDER — INSULIN GLARGINE 100 UNIT/ML ~~LOC~~ SOLN: 10.0000 [IU] | Freq: Every day | SUBCUTANEOUS | Status: DC

## 2013-10-17 MED ORDER — GLUCERNA SHAKE PO LIQD: 237.0000 mL | Freq: Three times a day (TID) | ORAL | Status: AC

## 2013-10-17 MED ORDER — INSULIN ASPART 100 UNIT/ML ~~LOC~~ SOLN
0.0000 [IU] | Freq: Three times a day (TID) | SUBCUTANEOUS | Status: DC
  Administered 2013-10-17: 1 [IU] via SUBCUTANEOUS

## 2013-10-17 MED ORDER — INSULIN ASPART 100 UNIT/ML ~~LOC~~ SOLN: 0.0000 [IU] | Freq: Every day | SUBCUTANEOUS | Status: DC

## 2013-10-17 MED ORDER — INSULIN ASPART 100 UNIT/ML ~~LOC~~ SOLN: SUBCUTANEOUS | Status: DC

## 2013-10-17 MED ORDER — RESOURCE THICKENUP CLEAR PO POWD: ORAL | Status: AC

## 2013-10-17 MED ORDER — ACETAMINOPHEN 325 MG PO TABS
650.0000 mg | ORAL_TABLET | Freq: Four times a day (QID) | ORAL | Status: DC | PRN
  Administered 2013-10-17: 650 mg via ORAL
  Filled 2013-10-17: qty 2

## 2013-10-17 MED ORDER — INSULIN STARTER KIT- SYRINGES (ENGLISH)
1.0000 | Freq: Once | Status: AC
  Administered 2013-10-17: 1
  Filled 2013-10-17: qty 1

## 2013-10-17 NOTE — Progress Notes (Signed)
Reinforced teaching with patient's sister regarding Novolog correction scale before meals and at bedtime.  She seemed confused about giving Lantus at bedtime and also Novolog.  Explained that the Novolog was only to be given if CBG's are greater than 201 mg/dL at bedtime however Lantus is given regardless of CBG.  Explained several times and gave examples, however still not clear if she really understands.  RN states that she attempted to explain several times also.  At the end of our conversation, she stated that she gets it.  Expect fair compliance.  Encouraged her to write down CBG's and the amount of insulin given also.  Thanks, Beryl Meager, RN, BC-ADM Inpatient Diabetes Coordinator Pager [phone]

## 2013-10-17 NOTE — Progress Notes (Signed)
Patient D/C'd to home with sister via ambulance. IV and telemetry D/C'd. Discharge paperwork and medications reviewed with sister. Diabetes educator did further review of insulin regimen with patient. Sister states no questions about discharge instructions. Prescriptions handed to sister.

## 2013-10-17 NOTE — Progress Notes (Addendum)
Will see patient and hopefully his sister today.  RD has seen and given basic education I would recommend at discharge to order Lantus 10 units and use the sensitive to moderate correction scale at home [facility]) tidwc.  Thank you, Lenor Coffin, RN, CNS, Diabetes Coordinator [phone]) AD. Sister is still not here. Have left a msg with sister Olegario Messier again. RN states family will be here in approx 30 mins. RN can instruct on correction scale and/or can page me with asistance if needed [facility]

## 2013-10-17 NOTE — Discharge Summary (Signed)
Physician Discharge Summary  [name] RUE:[id number] DOB: 05/14/1951 DOA: 10/14/2013  PCP: Reather Converse, PA-C  Admit date: 10/14/2013 Discharge date: 10/17/2013  Time spent: 35 minutes  Recommendations for Outpatient Follow-up:  1. 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 year old male with multiple medical problems including prior history aphasia status post massive CVA approximately 8 years ago, insulin-dependent diabetes, failure to thrive,atrial fibrillation, dysphasia presenting with decreased appetite. Patient's sister and health care power of attorney states the patient has had decreased appetite over the past 2-3 days which is wall for . Sister states the patient normally eats a good portion of 3 meals per day. Denies any fevers or chills. No recent falls. No episodes of vomiting or diarrhea. Sugars at home have been in the 100s.  Present to the ER today with an initial glucose of 497. Bicarbonate 19. Potassium 5.4. White blood cell count 11.3. Hemoglobin 16.3.head CT showed chronic changes consistent with previous show with no evidence of acute event.chest x-ray negative for CHF or pneumonia.urinalysis with greater than 1000 glucose but no leukocytosis   Hospital Course:  DM (diabetes mellitus), type 2, uncontrolled/hyperosmolar nonketotic state:  sliding scale and  Lantus.  hemoglobin A1c 10   Atrial fibrillation: On Eliquis   History of 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: Cared for at home by family members  Unilateral AKA, right  Unilateral complete BKA, left  Protein calorie malnutrition: Health shakes  Dehydration: resolved  Fair to thrive likely related to dehydration and HONK  Chronic systolic heart failure: Ejection fraction in 2013 25-30%  Hyperkalemia resolved.   Procedures:  MBS  Consultations:  Speech  diabetes  Discharge Exam: Filed Vitals:   10/17/13 0509  BP: 151/84  Pulse: 58  Temp: 98 F (36.7 C)  Resp: 16      Discharge Instructions You were cared for by a hospitalist during your hospital stay. If you have any questions about your discharge medications or the care you received while you were in the hospital after you are discharged, you can call the unit and asked to speak with the hospitalist on call if the hospitalist that took care of you is not available. Once you are discharged, your primary care physician will handle any further medical issues. Please note that NO REFILLS for any discharge medications will be authorized once you are discharged, as it is imperative that you return to your primary care physician (or establish a relationship with a primary care physician if you do not have one) for your aftercare needs so that they can reassess your need for medications and monitor your lab values.  Discharge Orders   Future Orders Complete By Expires   Discharge instructions  As directed        Medication List    STOP taking these medications  glipiZIDE 5 MG tablet  Commonly known as:  GLUCOTROL      TAKE these medications       apixaban 5 MG Tabs tablet  Commonly known as:  ELIQUIS  Take 1 tablet (5 mg total) by mouth 2 (two) times daily.     digoxin 0.125 MG tablet  Commonly known as:  LANOXIN  Take 0.125 mg by mouth every morning.     feeding  supplement (GLUCERNA SHAKE) Liqd  Take 237 mLs by mouth 3 (three) times daily between meals.     insulin aspart 100 UNIT/ML injection  Commonly known as:  novoLOG  - CBG 70 - 120: 0 units  - CBG 121 - 150: 0 units  - CBG 151 - 200: 0 units  - CBG 201 - 250: 2 units  - CBG 251 - 300: 3 units  - CBG 301 - 350: 4 units  - CBG 351 - 400: 5 units  - QHS     insulin aspart 100 UNIT/ML injection  Commonly known as:  novoLOG  - CBG 70 - 120: 0 units  - CBG 121 - 150: 1 unit  - CBG 151 - 200: 2 units  - CBG 201 - 250: 3 units  - CBG 251 - 300: 5 units  - CBG 301 - 350: 7 units  - CBG 351 - 400: 9 units  - TID WITH MEALS     insulin glargine 100 UNIT/ML injection  Commonly known as:  LANTUS  Inject 0.1 mLs (10 Units total) into the skin at bedtime.     lisinopril 20 MG tablet  Commonly known as:  PRINIVIL,ZESTRIL  Take 20 mg by mouth every morning.     metoprolol tartrate 25 MG tablet  Commonly known as:  LOPRESSOR  Take 25 mg by mouth 2 (two) times daily.     pravastatin 20 MG tablet  Commonly known as:  PRAVACHOL  Take 20 mg by mouth every morning.     RESOURCE THICKENUP CLEAR Powd  As needed     Vitamin D (Ergocalciferol) 50000 UNITS Caps capsule  Commonly known as:  DRISDOL  Take 50,000 Units by mouth every 7 (seven) days.       No Known Allergies    The results of significant diagnostics from this hospitalization (including imaging, microbiology, ancillary and laboratory) are listed below for reference.    Significant Diagnostic Studies: X-ray Chest Pa And Lateral   10/15/2013   CLINICAL DATA:  Decreased appetite and failure to thrive.  EXAM: CHEST - 2 VIEW  COMPARISON:  10/14/2013  FINDINGS: Stable COPD. There is no evidence of pulmonary edema, consolidation, pneumothorax, nodule or pleural fluid. Stable mild cardiomegaly. The bony thorax is unremarkable.  IMPRESSION: Stable COPD and mild cardiomegaly.   Electronically Signed   By: Irish LackGlenn  Yamagata  M.D.   On: 10/15/2013 09:00   Ct Head Wo Contrast  10/14/2013   CLINICAL DATA:  Decreased oral intake for several days.  , weakness  EXAM: CT HEAD WITHOUT CONTRAST  TECHNIQUE: Contiguous axial images were obtained from the base of the skull through the vertex without intravenous contrast.  COMPARISON:  CT HEAD W/O CM dated 12/18/2012  FINDINGS: There is a large area of stable encephalomalacia in the left frontal and anterior parietal lobes. There is stable mild-to-moderate diffuse cerebral and cerebellar atrophy with compensatory ventriculomegaly. There is no evidence of an acute intracranial hemorrhage nor of an evolving ischemic infarction. The cerebellum and brainstem exhibit no acute abnormalities.  At bone window settings the observed portions of the paranasal sinuses and mastoid air cells are clear. There is no evidence of an acute skull fracture.  IMPRESSION: 1. There are extensive stable chronic changes in the left frontal and anterior parietal lobes consistent with previous ischemic insult. 2. There is no evidence of an acute ischemic or hemorrhagic event. 3. There is stable mild to moderate diffuse cerebral and cerebellar atrophy with compensatory ventriculomegaly.   Electronically Signed   By: David  [place]   On: 10/14/2013 20:07   Dg Chest Portable 1 View  10/14/2013   CLINICAL DATA:  Cough  EXAM: PORTABLE CHEST - 1 VIEW  COMPARISON:  DG CHEST 2 VIEW dated 12/18/2012  FINDINGS: The lungs are adequately inflated. There is no focal infiltrate. There is no pleural effusion or pneumothorax. The cardiopericardial silhouette is top-normal in size. The pulmonary vascularity is not engorged. The observed portions of the bony thorax appear normal.  IMPRESSION: There is no evidence of CHF nor pneumonia nor other acute cardiopulmonary disease.   Electronically Signed   By: David  [place]   On: 10/14/2013 19:24   Dg Swallowing Func-speech Pathology  10/16/2013   Riley Nearing Deblois, CCC-SLP     10/16/2013  12:53 PM Objective Swallowing Evaluation: Modified Barium Swallowing Study   Patient Details  Name: DMONTE MAHER MRN: [id number] Date of Birth: February 21, 1951  Today's Date: 10/16/2013 Time: 4098-1191 SLP Time Calculation (min): 25 min  Past Medical History:  Past Medical History  Diagnosis Date  . Active smoker   . Alcohol abuse, episodic   . Difficult intubation   . Hypertension   . Dysrhythmia   . COPD (chronic obstructive pulmonary disease)   . Shortness of breath   . Diabetes mellitus   . Peripheral vascular disease   . CHF (congestive heart failure)   . 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 Vascular surgery    . Leg amputation above knee      right  . Leg amputation below knee      left  . Laryngoscopy  12/27/2011    Procedure: LARYNGOSCOPY;  Surgeon: Christia Reading, MD;  Location:  [facility] OR;  Service: ENT;  Laterality: N/A;  direct video  laryngoscopy with evaluation of upper airway.reintubation.   HPI:  This is a 63 y.o. year old male with multiple medical problems  including prior history of dysphagia and aphasia status post  massive CVA approximately 8 years ago, insulin-dependent  diabetes, failure to thrive,atrial fibrillation, dysphasia  presenting with decreased appetite. Patient's sister and health  care power of attorney states the patient has had decreased  appetite over the past 2-3 days which is wall for . Sister states  the patient normally eats a good portion of 3 meals per day.  Denies any fevers or chills. No recent falls. No episodes of  vomiting or diarrhea. Sugars at home have been in the 100s. Pt  with MBS following cervical injury in 2014 with severe  oropharyngeal dysphagia, recommended to be NPO.      Assessment / Plan / Recommendation Clinical Impression  Dysphagia Diagnosis: Mild oral phase dysphagia;Moderate  pharyngeal phase dysphagia Clinical impression: Pt demonstrates a moderate oropharyngeal   dysphagia with a significnalty delayed swallow response and  moderate base of tongue and pharyngeal weakness. Pt silently  aspirates thin liquids pooled in pyriforms as swallow is  initiated.  Nectar thick liquids are only significantly penetrated  with overlarge, impulsive boluses. There are moderate vallecular  and pharyngeal residuals present post swallow that mostly clear  with independent second swallow. Pt is unable to follow cueing  for compensatory strategies due to language impairment. He also  does not have a reliable cough response or sensation of penetrate  (he did have a late cough unrelated to aspiration or esophageal  function). Recommend Dys 2 diet with nectar thick liquids with  moderate risk of aspiration with any POs due to residuals. SLP to  f/u for tolerance.     Treatment Recommendation  Therapy as outlined in treatment plan below    Diet Recommendation Dysphagia 2 (Fine chop);Nectar-thick liquid   Liquid Administration via: Cup;Spoon;Straw (supervision/assist  for small sips) Medication Administration: Whole meds with puree Supervision: Staff to assist with self feeding Compensations: Slow rate;Small sips/bites;Check for pocketing Postural Changes and/or Swallow Maneuvers: Seated upright 90  degrees;Upright 30-60 min after meal    Other  Recommendations Oral Care Recommendations: Oral care BID Other Recommendations: Order thickener from pharmacy   Follow Up Recommendations  Home health SLP    Frequency and Duration min 2x/week  2 weeks   Pertinent Vitals/Pain NA    SLP Swallow Goals     General HPI: This is a 63 y.o. year old male with multiple  medical problems including prior history of dysphagia and aphasia  status post massive CVA approximately 8 years ago,  insulin-dependent diabetes, failure to thrive,atrial  fibrillation, dysphasia presenting with decreased appetite.  Patient's sister and health care power of attorney states the  patient has had decreased appetite over the past 2-3 days  which  is wall for . Sister states the patient normally eats a good  portion of 3 meals per day. Denies any fevers or chills. No  recent falls. No episodes of vomiting or diarrhea. Sugars at home  have been in the 100s. Pt with MBS following cervical injury in  2014 with severe oropharyngeal dysphagia, recommended to be NPO.  Type of Study: Modified Barium Swallowing Study Reason for Referral: Objectively evaluate swallowing function Previous Swallow Assessment: see HPI Diet Prior to this Study: Dysphagia 2 (chopped);Nectar-thick  liquids Temperature Spikes Noted: No Respiratory Status: Room air History of Recent Intubation: No Behavior/Cognition: Alert;Doesn't follow directions Oral Cavity - Dentition: Poor condition Oral Motor / Sensory Function: Impaired motor;Impaired sensory Oral impairment: Right lingual;Right facial;Right labial Self-Feeding Abilities: Able to feed self;Needs assist Patient Positioning: Upright in chair Baseline Vocal Quality:  (unable to vocalize on command) Volitional Cough: Cognitively unable to elicit Volitional Swallow: Unable to elicit Anatomy: Within functional limits Pharyngeal Secretions: Not observed secondary MBS    Reason for Referral Objectively evaluate swallowing function   Oral Phase Oral Preparation/Oral Phase Oral Phase: Impaired Oral - Nectar Oral - Nectar Teaspoon: Delayed oral transit Oral - Nectar Cup: Delayed oral transit Oral - Nectar Straw: Delayed oral transit Oral - Thin Oral - Thin Cup: Delayed oral transit Oral - Solids Oral - Puree: Delayed oral transit Oral - Mechanical Soft: Delayed oral transit   Pharyngeal Phase Pharyngeal Phase Pharyngeal Phase: Impaired Pharyngeal - Nectar Pharyngeal - Nectar Teaspoon: Delayed swallow initiation;Reduced  pharyngeal peristalsis;Reduced epiglottic inversion;Reduced  anterior laryngeal mobility;Reduced tongue base  retraction;Pharyngeal residue - valleculae;Pharyngeal residue -  pyriform sinuses Pharyngeal - Nectar Cup:  Delayed swallow initiation;Reduced  pharyngeal peristalsis;Reduced epiglottic inversion;Reduced  anterior laryngeal mobility;Reduced tongue base  retraction;Pharyngeal residue - valleculae;Pharyngeal residue -  pyriform sinuses;Penetration/Aspiration during  swallow;Penetration/Aspiration  after swallow Penetration/Aspiration details (nectar cup): Material enters  airway, CONTACTS cords and not ejected out;Material enters  airway, remains ABOVE vocal cords then ejected out;Material does  not enter airway Pharyngeal - Nectar Straw: Delayed swallow initiation;Reduced  pharyngeal peristalsis;Reduced epiglottic inversion;Reduced  anterior laryngeal mobility;Reduced tongue base  retraction;Pharyngeal residue - valleculae;Pharyngeal residue -  pyriform sinuses Pharyngeal - Thin Pharyngeal - Thin Cup: Delayed swallow initiation;Reduced  pharyngeal peristalsis;Reduced epiglottic inversion;Reduced  anterior laryngeal mobility;Reduced tongue base  retraction;Pharyngeal residue - valleculae;Pharyngeal residue -  pyriform sinuses;Penetration/Aspiration during swallow Penetration/Aspiration details (thin cup): Material enters  airway, passes BELOW cords without attempt by patient to eject  out (silent aspiration) Pharyngeal - Solids Pharyngeal - Puree: Delayed swallow initiation;Reduced pharyngeal  peristalsis;Reduced epiglottic inversion;Reduced anterior  laryngeal mobility;Reduced tongue base retraction;Pharyngeal  residue - valleculae;Pharyngeal residue - pyriform sinuses Pharyngeal - Mechanical Soft: Delayed swallow initiation;Reduced  pharyngeal peristalsis;Reduced epiglottic inversion;Reduced  anterior laryngeal mobility;Reduced tongue base  retraction;Pharyngeal residue - valleculae;Pharyngeal residue -  pyriform sinuses  Cervical Esophageal Phase    GO             Harlon Ditty, MA CCC-SLP [phone]  Riley Nearing Deblois 10/16/2013, 12:51 PM     Microbiology: Recent Results (from the past 240 hour(s))  URINE  CULTURE     Status: None   Collection Time    10/14/13  7:13 PM      Result Value Ref Range Status   Specimen Description URINE, RANDOM   Final   Special Requests ADDED 0320 10/15/13   Final   Culture  Setup Time     Final   Value: 10/15/2013 03:24     Performed at [facility]   Colony Count     Final   Value: NO GROWTH     Performed at [facility]   Culture     Final   Value: NO GROWTH     Performed at [facility]   Report Status 10/16/2013 FINAL   Final     Labs: Basic Metabolic Panel:  Recent Labs Lab 10/14/13 1828 10/14/13 2230 10/15/13 1110  NA 137  --  144  K 5.4*  --  4.4  CL 101  --  109  CO2 19  --  23  GLUCOSE 497*  --  369*  BUN 29*  --  22  CREATININE 1.05 0.99 0.92  CALCIUM 9.4  --  8.7   Liver Function Tests:  Recent Labs Lab 10/14/13 1828  AST 29  ALT 63*  ALKPHOS 5*  BILITOT 0.7  PROT 7.5  ALBUMIN 0.2*   No results found for this basename: LIPASE, AMYLASE,  in the last 168 hours No results found for this basename: AMMONIA,  in the last 168 hours CBC:  Recent Labs Lab 10/14/13 1855 10/14/13 2230  WBC 11.3* 9.9  HGB 16.3 15.2  HCT 47.2 45.7  MCV 88.6 89.6  PLT 203 193   Cardiac Enzymes: No results found for this basename: CKTOTAL, CKMB, CKMBINDEX, TROPONINI,  in the last 168 hours BNP: BNP (last 3 results) No results found for this basename: PROBNP,  in the last 8760 hours CBG:  Recent Labs Lab 10/16/13 0631 10/16/13 1236 10/16/13 1641 10/16/13 2117 10/17/13 0643  GLUCAP 153* 175* 93 129* 141*       Signed:  Joseph Art  [facility] 10/17/2013, 9:24 AM

## 2014-07-03 DIAGNOSIS — I6789 Other cerebrovascular disease: Secondary | ICD-10-CM | POA: Diagnosis not present

## 2014-08-22 DIAGNOSIS — E119 Type 2 diabetes mellitus without complications: Secondary | ICD-10-CM | POA: Diagnosis not present

## 2014-09-08 DIAGNOSIS — I6789 Other cerebrovascular disease: Secondary | ICD-10-CM | POA: Diagnosis not present

## 2014-09-11 ENCOUNTER — Emergency Department (HOSPITAL_COMMUNITY): Payer: Medicare Other

## 2014-09-11 ENCOUNTER — Encounter (HOSPITAL_COMMUNITY): Payer: Self-pay | Admitting: Emergency Medicine

## 2014-09-11 ENCOUNTER — Inpatient Hospital Stay (HOSPITAL_COMMUNITY)
Admission: EM | Admit: 2014-09-11 | Discharge: 2014-09-17 | DRG: 280 | Disposition: A | Discharge status: Home / Self Care | Payer: Medicare Other | Attending: Internal Medicine | Admitting: Internal Medicine

## 2014-09-11 DIAGNOSIS — Z66 Do not resuscitate: Secondary | ICD-10-CM | POA: Diagnosis not present

## 2014-09-11 DIAGNOSIS — I5043 Acute on chronic combined systolic (congestive) and diastolic (congestive) heart failure: Secondary | ICD-10-CM | POA: Diagnosis not present

## 2014-09-11 DIAGNOSIS — I255 Ischemic cardiomyopathy: Secondary | ICD-10-CM | POA: Diagnosis present

## 2014-09-11 DIAGNOSIS — J449 Chronic obstructive pulmonary disease, unspecified: Secondary | ICD-10-CM | POA: Diagnosis not present

## 2014-09-11 DIAGNOSIS — E0590 Thyrotoxicosis, unspecified without thyrotoxic crisis or storm: Secondary | ICD-10-CM | POA: Diagnosis present

## 2014-09-11 DIAGNOSIS — I482 Chronic atrial fibrillation: Secondary | ICD-10-CM | POA: Diagnosis present

## 2014-09-11 DIAGNOSIS — Z8619 Personal history of other infectious and parasitic diseases: Secondary | ICD-10-CM | POA: Diagnosis not present

## 2014-09-11 DIAGNOSIS — I10 Essential (primary) hypertension: Secondary | ICD-10-CM | POA: Diagnosis present

## 2014-09-11 DIAGNOSIS — N179 Acute kidney failure, unspecified: Secondary | ICD-10-CM | POA: Diagnosis present

## 2014-09-11 DIAGNOSIS — E119 Type 2 diabetes mellitus without complications: Secondary | ICD-10-CM

## 2014-09-11 DIAGNOSIS — Z89512 Acquired absence of left leg below knee: Secondary | ICD-10-CM

## 2014-09-11 DIAGNOSIS — M2180 Other specified acquired deformities of unspecified limb: Secondary | ICD-10-CM | POA: Diagnosis not present

## 2014-09-11 DIAGNOSIS — I69351 Hemiplegia and hemiparesis following cerebral infarction affecting right dominant side: Secondary | ICD-10-CM

## 2014-09-11 DIAGNOSIS — N183 Chronic kidney disease, stage 3 (moderate): Secondary | ICD-10-CM | POA: Diagnosis not present

## 2014-09-11 DIAGNOSIS — I2510 Atherosclerotic heart disease of native coronary artery without angina pectoris: Secondary | ICD-10-CM | POA: Diagnosis present

## 2014-09-11 DIAGNOSIS — R000 Tachycardia, unspecified: Secondary | ICD-10-CM | POA: Diagnosis not present

## 2014-09-11 DIAGNOSIS — I5023 Acute on chronic systolic (congestive) heart failure: Secondary | ICD-10-CM | POA: Diagnosis not present

## 2014-09-11 DIAGNOSIS — Z7901 Long term (current) use of anticoagulants: Secondary | ICD-10-CM | POA: Diagnosis not present

## 2014-09-11 DIAGNOSIS — D682 Hereditary deficiency of other clotting factors: Secondary | ICD-10-CM | POA: Diagnosis not present

## 2014-09-11 DIAGNOSIS — R0600 Dyspnea, unspecified: Secondary | ICD-10-CM | POA: Diagnosis not present

## 2014-09-11 DIAGNOSIS — I130 Hypertensive heart and chronic kidney disease with heart failure and stage 1 through stage 4 chronic kidney disease, or unspecified chronic kidney disease: Secondary | ICD-10-CM | POA: Diagnosis present

## 2014-09-11 DIAGNOSIS — J811 Chronic pulmonary edema: Secondary | ICD-10-CM | POA: Diagnosis not present

## 2014-09-11 DIAGNOSIS — I4891 Unspecified atrial fibrillation: Secondary | ICD-10-CM | POA: Diagnosis not present

## 2014-09-11 DIAGNOSIS — E1165 Type 2 diabetes mellitus with hyperglycemia: Secondary | ICD-10-CM | POA: Diagnosis present

## 2014-09-11 DIAGNOSIS — I69920 Aphasia following unspecified cerebrovascular disease: Secondary | ICD-10-CM | POA: Diagnosis not present

## 2014-09-11 DIAGNOSIS — I6789 Other cerebrovascular disease: Secondary | ICD-10-CM | POA: Diagnosis not present

## 2014-09-11 DIAGNOSIS — I252 Old myocardial infarction: Secondary | ICD-10-CM

## 2014-09-11 DIAGNOSIS — E1122 Type 2 diabetes mellitus with diabetic chronic kidney disease: Secondary | ICD-10-CM | POA: Diagnosis present

## 2014-09-11 DIAGNOSIS — I129 Hypertensive chronic kidney disease with stage 1 through stage 4 chronic kidney disease, or unspecified chronic kidney disease: Secondary | ICD-10-CM | POA: Diagnosis present

## 2014-09-11 DIAGNOSIS — Z87891 Personal history of nicotine dependence: Secondary | ICD-10-CM | POA: Diagnosis not present

## 2014-09-11 DIAGNOSIS — I5021 Acute systolic (congestive) heart failure: Secondary | ICD-10-CM | POA: Diagnosis not present

## 2014-09-11 DIAGNOSIS — Z899 Acquired absence of limb, unspecified: Secondary | ICD-10-CM | POA: Diagnosis present

## 2014-09-11 DIAGNOSIS — I214 Non-ST elevation (NSTEMI) myocardial infarction: Secondary | ICD-10-CM | POA: Diagnosis not present

## 2014-09-11 DIAGNOSIS — R6511 Systemic inflammatory response syndrome (SIRS) of non-infectious origin with acute organ dysfunction: Secondary | ICD-10-CM | POA: Diagnosis present

## 2014-09-11 DIAGNOSIS — I69951 Hemiplegia and hemiparesis following unspecified cerebrovascular disease affecting right dominant side: Secondary | ICD-10-CM

## 2014-09-11 DIAGNOSIS — I739 Peripheral vascular disease, unspecified: Secondary | ICD-10-CM | POA: Diagnosis present

## 2014-09-11 DIAGNOSIS — Z79899 Other long term (current) drug therapy: Secondary | ICD-10-CM

## 2014-09-11 DIAGNOSIS — I110 Hypertensive heart disease with heart failure: Secondary | ICD-10-CM | POA: Diagnosis present

## 2014-09-11 DIAGNOSIS — E1151 Type 2 diabetes mellitus with diabetic peripheral angiopathy without gangrene: Secondary | ICD-10-CM | POA: Diagnosis present

## 2014-09-11 DIAGNOSIS — J90 Pleural effusion, not elsewhere classified: Secondary | ICD-10-CM | POA: Diagnosis not present

## 2014-09-11 DIAGNOSIS — Z89611 Acquired absence of right leg above knee: Secondary | ICD-10-CM | POA: Diagnosis not present

## 2014-09-11 DIAGNOSIS — E785 Hyperlipidemia, unspecified: Secondary | ICD-10-CM | POA: Diagnosis not present

## 2014-09-11 DIAGNOSIS — I69320 Aphasia following cerebral infarction: Secondary | ICD-10-CM

## 2014-09-11 DIAGNOSIS — J439 Emphysema, unspecified: Secondary | ICD-10-CM | POA: Diagnosis not present

## 2014-09-11 DIAGNOSIS — I5030 Unspecified diastolic (congestive) heart failure: Secondary | ICD-10-CM

## 2014-09-11 DIAGNOSIS — Z7982 Long term (current) use of aspirin: Secondary | ICD-10-CM

## 2014-09-11 DIAGNOSIS — R531 Weakness: Secondary | ICD-10-CM | POA: Diagnosis not present

## 2014-09-11 DIAGNOSIS — I509 Heart failure, unspecified: Secondary | ICD-10-CM | POA: Diagnosis not present

## 2014-09-11 DIAGNOSIS — Z794 Long term (current) use of insulin: Secondary | ICD-10-CM

## 2014-09-11 DIAGNOSIS — J9811 Atelectasis: Secondary | ICD-10-CM | POA: Diagnosis not present

## 2014-09-11 HISTORY — DX: Acute myocardial infarction, unspecified: I21.9

## 2014-09-11 HISTORY — DX: Type 2 diabetes mellitus without complications: E11.9

## 2014-09-11 LAB — CBC WITH DIFFERENTIAL/PLATELET
BASOS ABS: 0 10*3/uL (ref 0.0–0.1)
Basophils Relative: 0 % (ref 0–1)
Eosinophils Absolute: 0 10*3/uL (ref 0.0–0.7)
Eosinophils Relative: 0 % (ref 0–5)
HEMATOCRIT: 44.6 % (ref 39.0–52.0)
Hemoglobin: 15.1 g/dL (ref 13.0–17.0)
LYMPHS PCT: 12 % (ref 12–46)
Lymphs Abs: 1.4 10*3/uL (ref 0.7–4.0)
MCH: 29.4 pg (ref 26.0–34.0)
MCHC: 33.9 g/dL (ref 30.0–36.0)
MCV: 86.8 fL (ref 78.0–100.0)
MONO ABS: 0.9 10*3/uL (ref 0.1–1.0)
Monocytes Relative: 8 % (ref 3–12)
Neutro Abs: 10.1 10*3/uL — ABNORMAL HIGH (ref 1.7–7.7)
Neutrophils Relative %: 80 % — ABNORMAL HIGH (ref 43–77)
Platelets: 214 10*3/uL (ref 150–400)
RBC: 5.14 MIL/uL (ref 4.22–5.81)
RDW: 14.3 % (ref 11.5–15.5)
WBC: 12.5 10*3/uL — AB (ref 4.0–10.5)

## 2014-09-11 LAB — BASIC METABOLIC PANEL
ANION GAP: 13 (ref 5–15)
BUN: 37 mg/dL — ABNORMAL HIGH (ref 6–23)
CHLORIDE: 108 mmol/L (ref 96–112)
CO2: 17 mmol/L — ABNORMAL LOW (ref 19–32)
CREATININE: 1.62 mg/dL — AB (ref 0.50–1.35)
Calcium: 9.2 mg/dL (ref 8.4–10.5)
GFR calc non Af Amer: 43 mL/min — ABNORMAL LOW (ref >90)
GFR, EST AFRICAN AMERICAN: 50 mL/min — AB (ref >90)
Glucose, Bld: 192 mg/dL — ABNORMAL HIGH (ref 70–99)
POTASSIUM: 4.3 mmol/L (ref 3.5–5.1)
Sodium: 138 mmol/L (ref 135–145)

## 2014-09-11 LAB — LIPID PANEL
Cholesterol: 145 mg/dL (ref 0–200)
HDL: 29 mg/dL — AB (ref >39)
LDL Cholesterol: 96 mg/dL (ref 0–99)
Total CHOL/HDL Ratio: 5 RATIO
Triglycerides: 98 mg/dL (ref <150)
VLDL: 20 mg/dL (ref 0–40)

## 2014-09-11 LAB — URINALYSIS, ROUTINE W REFLEX MICROSCOPIC
Glucose, UA: 100 mg/dL — AB
Ketones, ur: 15 mg/dL — AB
Leukocytes, UA: NEGATIVE
Nitrite: NEGATIVE
PH: 5 (ref 5.0–8.0)
Protein, ur: >300 mg/dL — AB
SPECIFIC GRAVITY, URINE: 1.028 (ref 1.005–1.030)
UROBILINOGEN UA: 1 mg/dL (ref 0.0–1.0)

## 2014-09-11 LAB — HEPATIC FUNCTION PANEL
ALT: 18 U/L (ref 0–53)
AST: 27 U/L (ref 0–37)
Albumin: 3.1 g/dL — ABNORMAL LOW (ref 3.5–5.2)
Alkaline Phosphatase: 81 U/L (ref 39–117)
Bilirubin, Direct: 0.3 mg/dL (ref 0.0–0.5)
Indirect Bilirubin: 1 mg/dL — ABNORMAL HIGH (ref 0.3–0.9)
TOTAL PROTEIN: 7 g/dL (ref 6.0–8.3)
Total Bilirubin: 1.3 mg/dL — ABNORMAL HIGH (ref 0.3–1.2)

## 2014-09-11 LAB — I-STAT TROPONIN, ED: Troponin i, poc: 0.79 ng/mL (ref 0.00–0.08)

## 2014-09-11 LAB — PROTEIN / CREATININE RATIO, URINE
CREATININE, URINE: 13.86 mg/dL
Protein Creatinine Ratio: 3.1 — ABNORMAL HIGH (ref 0.00–0.15)
Total Protein, Urine: 43 mg/dL

## 2014-09-11 LAB — BRAIN NATRIURETIC PEPTIDE: B NATRIURETIC PEPTIDE 5: 2342.7 pg/mL — AB (ref 0.0–100.0)

## 2014-09-11 LAB — GLUCOSE, CAPILLARY
GLUCOSE-CAPILLARY: 164 mg/dL — AB (ref 70–99)
Glucose-Capillary: 199 mg/dL — ABNORMAL HIGH (ref 70–99)

## 2014-09-11 LAB — MRSA PCR SCREENING: MRSA by PCR: NEGATIVE

## 2014-09-11 LAB — URINE MICROSCOPIC-ADD ON

## 2014-09-11 LAB — SODIUM, URINE, RANDOM: SODIUM UR: 129 mmol/L

## 2014-09-11 LAB — TROPONIN I: Troponin I: 1.78 ng/mL (ref <0.031)

## 2014-09-11 LAB — TSH: TSH: 0.252 u[IU]/mL — ABNORMAL LOW (ref 0.350–4.500)

## 2014-09-11 LAB — CREATININE, URINE, RANDOM: CREATININE, URINE: 13.81 mg/dL

## 2014-09-11 LAB — PHOSPHORUS: PHOSPHORUS: 4 mg/dL (ref 2.3–4.6)

## 2014-09-11 LAB — MAGNESIUM: Magnesium: 1.7 mg/dL (ref 1.5–2.5)

## 2014-09-11 LAB — DIGOXIN LEVEL: Digoxin Level: 0.2 ng/mL — ABNORMAL LOW (ref 0.8–2.0)

## 2014-09-11 MED ORDER — INSULIN ASPART 100 UNIT/ML ~~LOC~~ SOLN
0.0000 [IU] | Freq: Three times a day (TID) | SUBCUTANEOUS | Status: DC
  Administered 2014-09-12: 3 [IU] via SUBCUTANEOUS
  Administered 2014-09-12: 5 [IU] via SUBCUTANEOUS
  Administered 2014-09-12 – 2014-09-13 (×2): 2 [IU] via SUBCUTANEOUS
  Administered 2014-09-13: 5 [IU] via SUBCUTANEOUS
  Administered 2014-09-13 – 2014-09-14 (×3): 2 [IU] via SUBCUTANEOUS
  Administered 2014-09-14: 3 [IU] via SUBCUTANEOUS
  Administered 2014-09-15 (×2): 2 [IU] via SUBCUTANEOUS
  Administered 2014-09-15: 1 [IU] via SUBCUTANEOUS
  Administered 2014-09-16: 7 [IU] via SUBCUTANEOUS
  Administered 2014-09-17: 3 [IU] via SUBCUTANEOUS
  Administered 2014-09-17: 2 [IU] via SUBCUTANEOUS

## 2014-09-11 MED ORDER — DILTIAZEM HCL 100 MG IV SOLR
5.0000 mg/h | Freq: Once | INTRAVENOUS | Status: AC
  Administered 2014-09-11: 5 mg/h via INTRAVENOUS
  Filled 2014-09-11: qty 100

## 2014-09-11 MED ORDER — ACETAMINOPHEN 650 MG RE SUPP: 650.0000 mg | Freq: Four times a day (QID) | RECTAL | Status: DC | PRN

## 2014-09-11 MED ORDER — SODIUM CHLORIDE 0.9 % IV SOLN
INTRAVENOUS | Status: DC
  Administered 2014-09-12: 10:00:00 via INTRAVENOUS

## 2014-09-11 MED ORDER — ASPIRIN 81 MG PO CHEW
324.0000 mg | CHEWABLE_TABLET | Freq: Once | ORAL | Status: AC
  Administered 2014-09-11: 324 mg via ORAL
  Filled 2014-09-11: qty 4

## 2014-09-11 MED ORDER — ALBUTEROL SULFATE (2.5 MG/3ML) 0.083% IN NEBU: 5.0000 mg | INHALATION_SOLUTION | RESPIRATORY_TRACT | Status: DC | PRN

## 2014-09-11 MED ORDER — MAGNESIUM SULFATE 2 GM/50ML IV SOLN
2.0000 g | Freq: Once | INTRAVENOUS | Status: AC
  Administered 2014-09-11: 2 g via INTRAVENOUS
  Filled 2014-09-11: qty 50

## 2014-09-11 MED ORDER — ATORVASTATIN CALCIUM 80 MG PO TABS
80.0000 mg | ORAL_TABLET | Freq: Every day | ORAL | Status: DC
  Administered 2014-09-12 – 2014-09-16 (×5): 80 mg via ORAL
  Filled 2014-09-11 (×6): qty 1

## 2014-09-11 MED ORDER — METOPROLOL TARTRATE 1 MG/ML IV SOLN
5.0000 mg | INTRAVENOUS | Status: DC | PRN
  Administered 2014-09-11: 5 mg via INTRAVENOUS
  Filled 2014-09-11: qty 5

## 2014-09-11 MED ORDER — CALCITRIOL 0.25 MCG PO CAPS
0.2500 ug | ORAL_CAPSULE | Freq: Every day | ORAL | Status: DC
  Administered 2014-09-11 – 2014-09-17 (×7): 0.25 ug via ORAL
  Filled 2014-09-11 (×8): qty 1

## 2014-09-11 MED ORDER — PRAVASTATIN SODIUM 40 MG PO TABS
40.0000 mg | ORAL_TABLET | Freq: Every day | ORAL | Status: DC
  Administered 2014-09-11: 40 mg via ORAL
  Filled 2014-09-11 (×2): qty 1

## 2014-09-11 MED ORDER — METOPROLOL TARTRATE 25 MG PO TABS: 25.0000 mg | ORAL_TABLET | Freq: Two times a day (BID) | ORAL | Status: DC

## 2014-09-11 MED ORDER — ACETAMINOPHEN 325 MG PO TABS
650.0000 mg | ORAL_TABLET | Freq: Four times a day (QID) | ORAL | Status: DC | PRN
  Administered 2014-09-14 – 2014-09-16 (×2): 650 mg via ORAL
  Filled 2014-09-11 (×2): qty 2

## 2014-09-11 MED ORDER — CARVEDILOL 3.125 MG PO TABS
3.1250 mg | ORAL_TABLET | Freq: Two times a day (BID) | ORAL | Status: DC
  Filled 2014-09-11: qty 1

## 2014-09-11 MED ORDER — DIGOXIN 125 MCG PO TABS
0.1250 mg | ORAL_TABLET | Freq: Every day | ORAL | Status: DC
Start: 2014-09-11 — End: 2014-09-14
  Administered 2014-09-11 – 2014-09-14 (×4): 0.125 mg via ORAL
  Filled 2014-09-11 (×5): qty 1

## 2014-09-11 MED ORDER — FUROSEMIDE 10 MG/ML IJ SOLN
40.0000 mg | Freq: Two times a day (BID) | INTRAMUSCULAR | Status: DC
  Administered 2014-09-11 – 2014-09-12 (×3): 40 mg via INTRAVENOUS
  Filled 2014-09-11 (×6): qty 4

## 2014-09-11 MED ORDER — APIXABAN 2.5 MG PO TABS
2.5000 mg | ORAL_TABLET | Freq: Two times a day (BID) | ORAL | Status: DC
  Administered 2014-09-11: 2.5 mg via ORAL
  Filled 2014-09-11 (×3): qty 1

## 2014-09-11 MED ORDER — ENOXAPARIN SODIUM 40 MG/0.4ML ~~LOC~~ SOLN: 40.0000 mg | SUBCUTANEOUS | Status: DC

## 2014-09-11 MED ORDER — CARVEDILOL 6.25 MG PO TABS
6.2500 mg | ORAL_TABLET | Freq: Two times a day (BID) | ORAL | Status: DC
  Administered 2014-09-12 – 2014-09-14 (×6): 6.25 mg via ORAL
  Filled 2014-09-11 (×8): qty 1

## 2014-09-11 MED ORDER — ASPIRIN EC 81 MG PO TBEC
81.0000 mg | DELAYED_RELEASE_TABLET | Freq: Every day | ORAL | Status: DC
  Filled 2014-09-11: qty 1

## 2014-09-11 MED ORDER — FUROSEMIDE 10 MG/ML IJ SOLN
40.0000 mg | Freq: Once | INTRAMUSCULAR | Status: AC
  Administered 2014-09-11: 40 mg via INTRAVENOUS
  Filled 2014-09-11: qty 4

## 2014-09-11 NOTE — H&P (Signed)
Date: 09/11/2014               Patient Name:  [name] MRN: [id number]  DOB: April 20, 1951 Age / Sex: 64 y.o., male   PCP: No primary care provider on file.         Medical Service: Internal Medicine Teaching Service         Attending Physician: Dr. Margarito Liner, MD    First Contact: MS4 Gypsy Lore  Pager: [phone]  Second Contact: Dr. 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   Pt is non-verbal and history consequently obtained from sister who has been his caretaker for past 8 years. She reports that for the past 2 days his blood sugars having been running high and low and has been tachypneic, dyspneic, and coughing/possible choking after eating. She reports he has not been eating much or acting himself for the past few days.  He had echo in 2013 that revealed EF 30-35% and is on lopressor and digoxin at home. He also has afib on lopressor for rate control and eliquis for anti-coagulation. He has not been hospitalized since May of 2015.              Meds:  No current facility-administered medications on file prior to encounter.   No current outpatient prescriptions on file prior to encounter.    Current Facility-Administered Medications  Medication Dose Route Frequency Provider Last Rate Last Dose  . acetaminophen (TYLENOL) tablet 650 mg  650 mg Oral Q6H PRN Otis Brace, MD       Or  . acetaminophen (TYLENOL) suppository 650 mg  650 mg Rectal Q6H PRN Otis Brace, MD      . aspirin EC tablet 81 mg  81 mg Oral Daily Kayleana Waites, MD      . calcitRIOL (ROCALTROL) capsule 0.25 mcg  0.25 mcg Oral Daily Yovanny Coats, MD      . digoxin (LANOXIN) tablet 0.125 mg  0.125 mg Oral Daily Charbel Los, MD      . metoprolol tartrate (LOPRESSOR) tablet 25 mg  25 mg Oral BID Madinah Quarry, MD      . pravastatin (PRAVACHOL) tablet 40 mg  40 mg Oral q1800 Otis Brace, MD        Allergies: Allergies as of 09/11/2014  . (No Known Allergies)   Past Medical History  Diagnosis Date  . Diabetes mellitus without complication   . Stroke   . MI (myocardial infarction)   . Hypertension    Past Surgical History  Procedure Laterality Date  . Below knee leg amputation Left   . Above knee leg amputation Right    No family history on file. History   Social History  . Marital Status: Single    Spouse Name: N/A  . Number of Children: N/A  . Years of Education: N/A   Occupational History  . Not on file.   Social History Main Topics  . Smoking status: Former Games developer  .  Smokeless tobacco: Not on file  . Alcohol Use: Yes     Comment: Quit 8 years ago  . Drug Use: Yes    Special: Cocaine     Comment: Quit 8 years ago  . Sexual Activity: Not on file   Other Topics Concern  . Not on file   Social History Narrative  . No narrative on file    Review of Systems: Review of systems not obtained due to patient factors. Pt is non-verbal.   Physical Exam: Blood pressure 159/116, pulse 60, temperature 98.1 F (36.7 C), temperature source Oral, resp. rate 39, SpO2 99 %.   General: NAD Heart: Irregularly irregular rhythm with tachycardia  Lungs: Tacypniec. Bibasilar crackles with no wheezing or ronchi Abdomen: Soft, non-distended, possibly diffuse tenderness, normal BS Extremities: Left BKA and right AKA Neuro: Right sided flaccidity, no CN deficit    Lab results: Basic Metabolic Panel:  Recent Labs  16/10/96 1329  NA 138  K 4.3  CL 108   CO2 17*  GLUCOSE 192*  BUN 37*  CREATININE 1.62*  CALCIUM 9.2   Liver Function Tests: No results for input(s): AST, ALT, ALKPHOS, BILITOT, PROT, ALBUMIN in the last 72 hours. No results for input(s): LIPASE, AMYLASE in the last 72 hours. No results for input(s): AMMONIA in the last 72 hours. CBC:  Recent Labs  09/11/14 1329  WBC 12.5*  NEUTROABS 10.1*  HGB 15.1  HCT 44.6  MCV 86.8  PLT 214   Cardiac Enzymes: No results for input(s): CKTOTAL, CKMB, CKMBINDEX, TROPONINI in the last 72 hours. BNP: No results for input(s): PROBNP in the last 72 hours. D-Dimer: No results for input(s): DDIMER in the last 72 hours. CBG: No results for input(s): GLUCAP in the last 72 hours. Hemoglobin A1C: No results for input(s): HGBA1C in the last 72 hours. Fasting Lipid Panel: No results for input(s): CHOL, HDL, LDLCALC, TRIG, CHOLHDL, LDLDIRECT in the last 72 hours. Thyroid Function Tests: No results for input(s): TSH, T4TOTAL, FREET4, T3FREE, THYROIDAB in the last 72 hours. Anemia Panel: No results for input(s): VITAMINB12, FOLATE, FERRITIN, TIBC, IRON, RETICCTPCT in the last 72 hours. Coagulation: No results for input(s): LABPROT, INR in the last 72 hours. Urine Drug Screen: Drugs of Abuse  No results found for: LABOPIA, COCAINSCRNUR, LABBENZ, AMPHETMU, THCU, LABBARB  Alcohol Level: No results for input(s): ETH in the last 72 hours. Urinalysis:  Recent Labs  09/11/14 1406  COLORURINE AMBER*  LABSPEC 1.028  PHURINE 5.0  GLUCOSEU 100*  HGBUR MODERATE*  BILIRUBINUR SMALL*  KETONESUR 15*  PROTEINUR >300*  UROBILINOGEN 1.0  NITRITE NEGATIVE  LEUKOCYTESUR NEGATIVE    Imaging results:  Dg Chest Portable 1 View  09/11/2014   CLINICAL DATA:  History of CVA, diabetes and previous MI, current hyperglycemia, previous history of tobacco use  EXAM: PORTABLE CHEST - 1 VIEW  COMPARISON:  None.  FINDINGS: Multiple portable films reveal the lungs to be hyperinflated. There are pleural  effusions layering posteriorly, bilaterally. The cardiac silhouette is enlarged. The pulmonary vascularity is engorged. The mediastinum is normal in width.  IMPRESSION: COPD with superimposed CHF with mild interstitial edema. There is no alveolar pneumonia.   Electronically Signed   By: David  [place]   On: 09/11/2014 13:24    Other results:  Ventricular Rate: 121 PR Interval:  QRS Duration: 99 QT Interval: 313 QTC Calculation: 444 R Axis: 81 Text Interpretation: Atrial fibrillation Ventricular premature complex  Probable anterior infarct, age indeterminate Abnormal T, consider  ischemia, inferior  leads Lateral leads are also involved No previous ECGs  available     Assessment & Plan by Problem: Principal Problem:   Atrial fibrillation with rapid ventricular response Active Problems:   Hypertension   COPD (chronic obstructive pulmonary disease)   T2DM (type 2 diabetes mellitus)   PAD (peripheral artery disease)   Hemiparesis affecting right side as late effect of cerebrovascular accident   Amputee of extremity   Aphasia as late effect of cerebrovascular accident   CHF (congestive heart failure)   Atrial Fibrillation with RVR - Pt with HR in 130's on admission and placed on IV diltiazem in ED with improvement of HR. Pt in chronic afib at home on lopressor for rate control and eliquis for [facility] therapy. Unclear trigger, possibly AECHF vs ACS in setting of elevated troponin and EKG with t-wave inversions.  -Discontinue diltiazem drip  -Start carvedilol 3.125 mg BID (pt at home on lopressor 25 mg BID) -Obtain cardiology consult  -Eliquis per pharmacy, discontinue home aspirin 81 mg daily  -Cycle troponins  -Obtain TSH and magnesium level  -Repeat 12-lead EKG in AM  Acute on Chronic Systolic CHF in setting of Ischemic Cardiomyopathy - Pt with last 2D-echo September 2013 EF 25-30%. Pt with chest xray on admission with pulmonary vascular congestion  and received IV lasix 40  mg x 1 in ED.  -Obtain cardiology consult -Obtain BNP level  -Start IV lasix 40 mg BID  -Start carvedilol 3.125 mg BID (pt at home on lopressor 25 mg BID) -Continue digoxin 0.125 mg daily  -Hold home lisinopril 40 mg daily in setting of AKI -Repeat CXR in AM   -Obtain 2D-echo   Non-oliguric AKI - Cr up to 1.62 from last Cr 0.92 of May 2015. UA with proteinuria. Etiology possibly cardiorenal syndrome in setting of reduced systolic EF.  -Monitor daily weights and strict I & O's  -Monitor BMP   -Obtain urine Na, urine Cr, and protein:Cr ratio   Hypertension - Elevated in 150-160's. Pt at home on lopressor 25 mg BID.  -Start carvedilol 3.125 mg BID  -Hold home lisinopril 40 mg daily in setting of AKI  Insulin-dependent Type 2 DM - Last A1c 10 on 5//11/15. Pt at home on Lantus 0-5 U daily and SSI before meals. -Hold home glipizide 5 mg BID and Lantus  -Sensitive SSI with meals -Monitor glucose  History of CVA with right sided hemiparesis and expressive aphasia - Currently at baseline neurological status. -Discontinue home aspirin 81 mg daily in setting of eliquis  -Continue home pravastatin 40 mg daily   COPD - No current exacerbation. Pt not on home inhalers. No PFT's on file. -Albuterol nebulizer Q 4 Hr PRN  Hyperlipidemia - No lipid panel on file.  Pt at home on pravastatin daily.  -Obtain lipid panel -Continue home pravastatin 40 mg daily   History of Hepatitis C infection -Per pt's sister had hepatitis C infection in past.  -Obtain acute hepatitis panel   -Obtain HIV AB  Diet: Heart DVT Ppx: Eliquis  Code: DNR (confirmed by sister and form in chart)  Dispo: Disposition is deferred at this time, awaiting improvement of current medical problems. Anticipated discharge in approximately 2-4 day(s).   The patient does have a current PCP (No primary care provider on file.) and does need an [facility] hospital follow-up appointment after discharge.  The patient does have  transportation limitations that hinder transportation to clinic appointments.  Signed: Otis Brace, MD 09/11/2014, 5:26 PM

## 2014-09-11 NOTE — H&P (Signed)
Date: 09/11/2014               Patient Name:  [name] MRN: [id number]  DOB: [birth year] Age / Sex: 64 y.o., male   PCP: No primary care provider on file.         Medical Service: Internal Medicine Teaching Service         Attending Physician: Dr. Margarito Liner, MD    First Contact: Gypsy Lore, MS4 Pager: [phone]  Second Contact: Dr. Otis Brace Pager: [phone]       After Hours (After 5p/  First Contact Pager: [phone]  weekends / holidays): Second Contact Pager: [phone]   Chief Complaint: Shortness of breath  History of Present Illness: The patient has expressive aphasia and unable to provide history. History obtained from sister [facility]) and patient chart.  Patient had been having variable blood sugars for last few days, with decreased appetite/PO intake beginning on 4/6. Sister also reports episodes of coughing on food for which sister was concerned for aspiration. She notes that he was breathing fast the last two nights, with worsening SOB. Also believes patient was slightly more confused than his baseline. Sister was concerned for possible infectious etiology and called EMS when he had an elevated blood glucose today. On arrival to the patient's home, EMS identified a BG of 150 and  Afib was noted on EMS monitor. Patient was brought to ED by EMS.   Other than coughing, unstable BG, and worsening dyspnea, no complaints of pain, change in bowel movements, dysuria, or chest pain. Denies nausea or vomiting. Denies episode of aspiration.     Meds: Current Facility-Administered Medications  Medication Dose Route Frequency Provider Last Rate Last Dose  . furosemide (LASIX) injection 40 mg  40 mg Intravenous Once Zadie Rhine, MD       Current Outpatient Prescriptions  Medication Sig Dispense Refill  . aspirin EC 81 MG tablet Take 81 mg by mouth daily.    . calcitRIOL (ROCALTROL) 0.25 MCG capsule Take 0.25 mcg by mouth daily.    . digoxin (LANOXIN) 0.125 MG tablet Take  0.125 mg by mouth daily.    Marland Kitchen glipiZIDE (GLUCOTROL) 5 MG tablet Take 5 mg by mouth 2 (two) times daily before a meal.    . insulin aspart (NOVOLOG) 100 UNIT/ML injection Inject 0-9 Units into the skin 3 (three) times daily before meals. If 70-120 give 0units, if 121-150 give 1 units, 151-200 give 2 units, 201-250 give 3 units, 251-300 give 5 units, 301-350 give 7 units, 351-400 give 9 units    . insulin glargine (LANTUS) 100 UNIT/ML injection Inject 0-5 Units into the skin at bedtime. If BS is 70-200 give 0units, 201-250 give 2 units, 251-300 give 3 units, 301-350 give 4 units, 351-400 give 5 units.    Marland Kitchen lisinopril (PRINIVIL,ZESTRIL) 40 MG tablet Take 40 mg by mouth daily.    . metoprolol tartrate (LOPRESSOR) 25 MG tablet Take 25 mg by mouth 2 (two) times daily.    . pravastatin (PRAVACHOL) 20 MG tablet Take 40 mg by mouth daily.      Allergies: Allergies as of 09/11/2014  . (No Known Allergies)   Past Medical History  Diagnosis Date  . Diabetes mellitus without complication   . Stroke   . MI (myocardial infarction)   . Hypertension    Past Surgical History  Procedure Laterality Date  . Below knee leg amputation Left   . Above knee leg amputation Right    No family  history on file. History   Social History  . Marital Status: Single    Spouse Name: N/A  . Number of Children: N/A  . Years of Education: N/A   Occupational History  . Not on file.   Social History Main Topics  . Smoking status: Former Games developer  . Smokeless tobacco: Not on file  . Alcohol Use: 0.0 oz/week    0 Standard drinks or equivalent per week     Comment: Quit 8 years ago  . Drug Use: Yes    Special: Cocaine, IV     Comment: Quit 8 years ago  . Sexual Activity: Not on file   Other Topics Concern  . Not on file   Social History Narrative   Patient lives with sister [name])    Review of Systems: Per sister, patient has experienced coughing, has not complained of headache, chest pain, nausea, belly  pain.  Physical Exam: Blood pressure 153/133, pulse 124, temperature 99.8 F (37.7 C), temperature source Rectal, resp. rate 43, SpO2 98 %. CONSTITUTIONAL: frail, ill appearing, in mild distress HEAD: Normocephalic/atraumatic EYES: EOMI ENMT: Mucous membranes dry, poor dentition NECK: supple no meningeal signs CV: borderline tachycardic, irregular LUNGS: tachypnic, fine crackles LLL ABDOMEN: soft, mildly TTP, no rebound or guarding, bowel sounds noted throughout abdomen NEURO: Pt is awake/alert, he is nonverbal but he can follow commands  EXTREMITIES: s/p left BKA, s/p right AKA, no visible trauma SKIN: warm, color normal PSYCH: unable to assess  Lab results: Basic Metabolic Panel:  Recent Labs  16/10/96 1329  NA 138  K 4.3  CL 108  CO2 17*  GLUCOSE 192*  BUN 37*  CREATININE 1.62*  CALCIUM 9.2   CBC:  Recent Labs  09/11/14 1329  WBC 12.5*  NEUTROABS 10.1*  HGB 15.1  HCT 44.6  MCV 86.8  PLT 214   Cardiac Enzymes: Troponin: 0.79  Urinalysis:  Recent Labs  09/11/14 1406  COLORURINE AMBER*  LABSPEC 1.028  PHURINE 5.0  GLUCOSEU 100*  HGBUR MODERATE*  BILIRUBINUR SMALL*  KETONESUR 15*  PROTEINUR >300*  UROBILINOGEN 1.0  NITRITE NEGATIVE  LEUKOCYTESUR NEGATIVE   Imaging results:  Dg Chest Portable 1 View  09/11/2014   CLINICAL DATA:  History of CVA, diabetes and previous MI, current hyperglycemia, previous history of tobacco use  EXAM: PORTABLE CHEST - 1 VIEW  COMPARISON:  None.  FINDINGS: Multiple portable films reveal the lungs to be hyperinflated. There are pleural effusions layering posteriorly, bilaterally. The cardiac silhouette is enlarged. The pulmonary vascularity is engorged. The mediastinum is normal in width.  IMPRESSION: COPD with superimposed CHF with mild interstitial edema. There is no alveolar pneumonia.   Electronically Signed   By: David  [place]   On: 09/11/2014 13:24   2-D Echo 02/27/2012: Impressions:  - Normal LV size with mild  LV hypertrophy. EF 25-30% with global hypokinesis. Normal RV size with mildly decreased systolic function. No significant valvular abnormalities.  Other results: EKG: Atrial fibrillation with RVR, PVC, probable anterior infract, age indeterminate, Anormal T-waves, opssible ischemia in inferior and lateral leads. QTc 444.  Assessment & Plan by Problem: Principal Problem:   Atrial fibrillation with rapid ventricular response Active Problems:   Hypertension   COPD (chronic obstructive pulmonary disease)   T2DM (type 2 diabetes mellitus)   PAD (peripheral artery disease)   Hemiparesis affecting right side as late effect of cerebrovascular accident   Amputee of extremity   Aphasia as late effect of cerebrovascular accident   CHF (congestive heart failure)  AKI (acute kidney injury)   Atrial fibrillation: left ventricular dysfunction and a history of previous stroke, at extremely high risk for recurrent embolic events ([id number] 6 a/w 9.7% annual risk of stroke). Patient takes apixaban per cardiology recommendations. Diltiazam drip /hr x4 hours. A fib likely contributing to presenting CHF symptoms (or vice versa). Could consider antiarrythmic on d/c. - digoxin PO 0.125 Qdaily - TSH - continue telemetry - continue apixaban 2.5mg  BID, renal dose reduction from home dose of  - repeat EKG for CP  CHF: Presenting with 2-day h/o worsening SOB concerning for CHF vs COPD, more likely CHF in setting of comorbid afib that may predispose to CHF exacerbation. Alternatively, CHF increases risk of afib. Received one dose furosemide in ED, diuresed . Patient does not take furosemide at home. Elevated troponin (0.79) on admission. May consider home lasix on d/c. - cycle troponin x3 q6hrs - con't digoxin PO 0.125 Qdaily - c/s cardiology - Furosemide IV  at 6pm - continue Furosemide IV  BID - 2-D Echo in AM  SIRS: Patient meets 3/4 SIRS criteria (tachycardic, tachypnic, WBC  12.5), no obvious source of infection. Skin intact, no pressure ulcers, UA significant for moderate hgb but no signs of infection, denies diarrhea, CXR on admission significant for mild interstitial edema, no alveolar pneumonia appreciated. Aspiration pneumonia possible in setting of reported choking episodes but less likely based on CXR findings - repeat CXR in AM - repeat CBC in AM - monitor VS   COPD: patient is former smoker, hyperinflation appreciated on CXR. Episodes of coughing and interstial edema appreciated on CXR could be 2/2 CHF or COPD exacerbation, more likely CHF in setting of a fib with RVR. - nebulizer prn  HTN: Hypertensive to 200/170s during evaluation. Resolved to 160/135 on recheck using doppler on right arm, 158/112 on right arm.  - d/c home metoprolol  BID - start carvedilol 3.125 BID, per cardiology recs in 2015 as indicated in setting of low EF, consider titrating up - hold lisinopril in setting of AKI  AKI: Creatinine 1.62 on admission, no h/o renal dysfunction. Holding fluids in setting of concern for CHF exacerbation 2/2 a fib.  - morning BMP - hold lisinopril - avoid NSAIDs  T2DM: Last A1C May 2015, 10. Sister reports unstable BG for past few days with reduced PO intake.  - Q4 BG check - sensitive sliding scale insulin - hold home oral medications -A1C  Cardiomyopathy: Patient was started on carvedilol by cardiologist in 06/2013. Previously labeled as nonischemic cardiomyopathy but, per Dr. Royann Shivers (cardiologist) likely underlying CAD.   Diet: carb and hear diet DVT Ppx: patient on home apixaban Code: DNR, confirmed with sister (POA)  Dispo: Disposition is deferred at this time, awaiting improvement of current medical problems. Anticipated discharge in approximately 1 day(s).   The patient does not have a current PCP (No primary care provider on file.) and does need an [facility] hospital follow-up appointment after discharge.  The patient does not have  transportation limitations that hinder transportation to clinic appointments.  Signed: Leveda Anna, Med Student 09/11/2014, 3:32 PM

## 2014-09-11 NOTE — ED Notes (Signed)
Phlebotomy at bedside.

## 2014-09-11 NOTE — ED Notes (Addendum)
Pt's wife and sister are at bedside at this time

## 2014-09-11 NOTE — Discharge Instructions (Addendum)
We have changed the following medications: They are at your pharmacy at [facility] on [address] [facility].   Please start taking Lasix  every day starting tomorrow, 4/14  Plast start taking Coreg 12.5mg  twice a day  Please start taking digoxin .  (reduced dose from your pre-admission dose)  Please start taking Bidil 20-37.5mg  three times a day  Please STOP taking Lopressor.   Please STOP taking pravastatin and START taking Lipitor 40 mg daily   Please do not take lisinopril until you have been evaluated by your PCP  You are scheduled for a thyroid study on April 26th and 27th - this is a two-day study. Please return to Nuclear Medicine department at [facility] for this test.       Atrial Fibrillation Atrial fibrillation is a type of irregular heart rhythm (arrhythmia). During atrial fibrillation, the upper chambers of the heart (atria) quiver continuously in a chaotic pattern. This causes an irregular and often rapid heart rate.  Atrial fibrillation is the result of the heart becoming overloaded with disorganized signals that tell it to beat. These signals are normally released one at a time by a part of the right atrium called the sinoatrial node. They then travel from the atria to the lower chambers of the heart (ventricles), causing the atria and ventricles to contract and pump blood as they pass. In atrial fibrillation, parts of the atria outside of the sinoatrial node also release these signals. This results in two problems. First, the atria receive so many signals that they do not have time to fully contract. Second, the ventricles, which can only receive one signal at a time, beat irregularly and out of rhythm with the atria.  There are three types of atrial fibrillation:   Paroxysmal. Paroxysmal atrial fibrillation starts suddenly and stops on its own within a week.  Persistent. Persistent atrial fibrillation lasts for more than a week. It may stop on its own or with  treatment.  Permanent. Permanent atrial fibrillation does not go away. Episodes of atrial fibrillation may lead to permanent atrial fibrillation. Atrial fibrillation can prevent your heart from pumping blood normally. It increases your risk of stroke and can lead to heart failure.  CAUSES   Heart conditions, including a heart attack, heart failure, coronary artery disease, and heart valve conditions.   Inflammation of the sac that surrounds the heart (pericarditis).  Blockage of an artery in the lungs (pulmonary embolism).  Pneumonia or other infections.  Chronic lung disease.  Thyroid problems, especially if the thyroid is overactive (hyperthyroidism).  Caffeine, excessive alcohol use, and use of some illegal drugs.   Use of some medicines, including certain decongestants and diet pills.  Heart surgery.   Birth defects.  Sometimes, no cause can be found. When this happens, the atrial fibrillation is called lone atrial fibrillation. The risk of complications from atrial fibrillation increases if you have lone atrial fibrillation and you are age 43 years or older. RISK FACTORS  Heart failure.  Coronary artery disease.  Diabetes mellitus.   High blood pressure (hypertension).   Obesity.   Other arrhythmias.   Increased age. SIGNS AND SYMPTOMS   A feeling that your heart is beating rapidly or irregularly.   A feeling of discomfort or pain in your chest.   Shortness of breath.   Sudden light-headedness or weakness.   Getting tired easily when exercising.   Urinating more often than normal (mainly when atrial fibrillation first begins).  In paroxysmal atrial fibrillation, symptoms may start  and suddenly stop. DIAGNOSIS  Your health care provider may be able to detect atrial fibrillation when taking your pulse. Your health care provider may have you take a test called an ambulatory electrocardiogram (ECG). An ECG records your heartbeat patterns over a  24-hour period. You may also have other tests, such as:  Transthoracic echocardiogram (TTE). During echocardiography, sound waves are used to evaluate how blood flows through your heart.  Transesophageal echocardiogram (TEE).  Stress test. There is more than one type of stress test. If a stress test is needed, ask your health care provider about which type is best for you.  Chest X-ray exam.  Blood tests.  Computed tomography (CT). TREATMENT  Treatment may include:  Treating any underlying conditions. For example, if you have an overactive thyroid, treating the condition may correct atrial fibrillation.  Taking medicine. Medicines may be given to control a rapid heart rate or to prevent blood clots, heart failure, or a stroke.  Having a procedure to correct the rhythm of the heart:  Electrical cardioversion. During electrical cardioversion, a controlled, low-energy shock is delivered to the heart through your skin. If you have chest pain, very low blood pressure, or sudden heart failure, this procedure may need to be done as an emergency.  Catheter ablation. During this procedure, heart tissues that send the signals that cause atrial fibrillation are destroyed.  Surgical ablation. During this surgery, thin lines of heart tissue that carry the abnormal signals are destroyed. This procedure can either be an open-heart surgery or a minimally invasive surgery. With the minimally invasive surgery, small cuts are made to access the heart instead of a large opening.  Pulmonary venous isolation. During this surgery, tissue around the veins that carry blood from the lungs (pulmonary veins) is destroyed. This tissue is thought to carry the abnormal signals. HOME CARE INSTRUCTIONS   Take medicines only as directed by your health care provider. Some medicines can make atrial fibrillation worse or recur.  If blood thinners were prescribed by your health care provider, take them exactly as  directed. Too much blood-thinning medicine can cause bleeding. If you take too little, you will not have the needed protection against stroke and other problems.  Perform blood tests at home if directed by your health care provider. Perform blood tests exactly as directed.  Quit smoking if you smoke.  Do not drink alcohol.  Do not drink caffeinated beverages such as coffee, soda, and some teas. You may drink decaffeinated coffee, soda, or tea.   Maintain a healthy weight.Do not use diet pills unless your health care provider approves. They may make heart problems worse.   Follow diet instructions as directed by your health care provider.  Exercise regularly as directed by your health care provider.  Keep all follow-up visits as directed by your health care provider. This is important. PREVENTION  The following substances can cause atrial fibrillation to recur:   Caffeinated beverages.  Alcohol.  Certain medicines, especially those used for breathing problems.  Certain herbs and herbal medicines, such as those containing ephedra or ginseng.  Illegal drugs, such as cocaine and amphetamines. Sometimes medicines are given to prevent atrial fibrillation from recurring. Proper treatment of any underlying condition is also important in helping prevent recurrence.  SEEK MEDICAL CARE IF:  You notice a change in the rate, rhythm, or strength of your heartbeat.  You suddenly begin urinating more frequently.  You tire more easily when exerting yourself or exercising. SEEK IMMEDIATE MEDICAL CARE IF:  You have chest pain, abdominal pain, sweating, or weakness.  You feel nauseous.  You have shortness of breath.  You suddenly have swollen feet and ankles.  You feel dizzy.  Your face or limbs feel numb or weak.  You have a change in your vision or speech. MAKE SURE YOU:   Understand these instructions.  Will watch your condition.  Will get help right away if you are not  doing well or get worse. Document Released: 05/23/2005 Document Revised: 10/07/2013 Document Reviewed: 07/03/2012 [facility] Patient Information 2015 [place], [place]. This information is not intended to replace advice given to you by your health care provider. Make sure you discuss any questions you have with your health care provider. Information on my medicine - ELIQUIS (apixaban)  This medication education was reviewed with me or my healthcare representative as part of my discharge preparation.  The pharmacist that spoke with me during my hospital stay was:  Severiano Gilbert, [facility]  Why was Eliquis prescribed for you? Eliquis was prescribed for you to reduce the risk of a blood clot forming that can cause a stroke if you have a medical condition called atrial fibrillation (a type of irregular heartbeat).  What do You need to know about Eliquis ? Take your Eliquis TWICE DAILY - one tablet in the morning and one tablet in the evening with or without food. If you have difficulty swallowing the tablet whole please discuss with your pharmacist how to take the medication safely.  Take Eliquis exactly as prescribed by your doctor and DO NOT stop taking Eliquis without talking to the doctor who prescribed the medication.  Stopping may increase your risk of developing a stroke.  Refill your prescription before you run out.  After discharge, you should have regular check-up appointments with your healthcare provider that is prescribing your Eliquis.  In the future your dose may need to be changed if your kidney function or weight changes by a significant amount or as you get older.  What do you do if you miss a dose? If you miss a dose, take it as soon as you remember on the same day and resume taking twice daily.  Do not take more than one dose of ELIQUIS at the same time to make up a missed dose.  Important Safety Information A possible side effect of Eliquis is bleeding. You should call your  healthcare provider right away if you experience any of the following: ? Bleeding from an injury or your nose that does not stop. ? Unusual colored urine (red or dark brown) or unusual colored stools (red or black). ? Unusual bruising for unknown reasons. ? A serious fall or if you hit your head (even if there is no bleeding).  Some medicines may interact with Eliquis and might increase your risk of bleeding or clotting while on Eliquis. To help avoid this, consult your healthcare provider or pharmacist prior to using any new prescription or non-prescription medications, including herbals, vitamins, non-steroidal anti-inflammatory drugs (NSAIDs) and supplements.  This website has more information on Eliquis (apixaban): http://www.eliquis.com/eliquis/home

## 2014-09-11 NOTE — ED Notes (Signed)
Condom cath placed.

## 2014-09-11 NOTE — ED Provider Notes (Signed)
CSN: [id number]     Arrival date & time 09/11/14  1150 History   First MD Initiated Contact with Patient 09/11/14 1211     Chief Complaint  Patient presents with  . Atrial Fibrillation    LEVEL 5 CAVEAT DUE TO ALTERED MENTAL STATUS  Patient is a 64 y.o. male presenting with shortness of breath. The history is provided by the patient.  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 Diabetes mellitus without complication   . Stroke   . MI (myocardial infarction)   . Hypertension    Past Surgical History  Procedure Laterality Date  . Below knee leg amputation Left   . Above knee leg amputation Right    No family history on file. History  Substance Use Topics  . Smoking status: Former Games developer  . Smokeless tobacco: Not on file  . Alcohol Use: Yes     Comment: Quit 8 years ago    Review of Systems  Unable to perform ROS: Mental status change  Constitutional: Negative for fever.  Respiratory: Positive for shortness of breath.   Gastrointestinal: Negative for vomiting.      Allergies  Review of patient's allergies indicates no known allergies.  Home Medications   Prior to Admission medications   Medication Sig Start Date End Date Taking? Authorizing Provider  aspirin EC 81 MG tablet Take 81 mg by mouth daily.   Yes Historical Provider, MD  calcitRIOL (ROCALTROL) 0.25 MCG capsule Take 0.25 mcg by mouth daily.   Yes Historical Provider, MD  digoxin  (LANOXIN) 0.125 MG tablet Take 0.125 mg by mouth daily.   Yes Historical Provider, MD  glipiZIDE (GLUCOTROL) 5 MG tablet Take 5 mg by mouth 2 (two) times daily before a meal.   Yes Historical Provider, MD  insulin aspart (NOVOLOG) 100 UNIT/ML injection Inject 0-9 Units into the skin 3 (three) times daily before meals. If 70-120 give 0units, if 121-150 give 1 units, 151-200 give 2 units, 201-250 give 3 units, 251-300 give 5 units, 301-350 give 7 units, 351-400 give 9 units   Yes Historical Provider, MD  insulin glargine (LANTUS) 100 UNIT/ML injection Inject 0-5 Units into the skin at bedtime. If BS is 70-200 give 0units, 201-250 give 2 units, 251-300 give 3 units, 301-350 give 4 units, 351-400 give 5 units.   Yes Historical Provider, MD  lisinopril (PRINIVIL,ZESTRIL) 40 MG tablet Take 40 mg by mouth daily.   Yes Historical Provider, MD  metoprolol tartrate (LOPRESSOR) 25 MG tablet Take 25 mg by mouth 2 (two) times daily.   Yes Historical Provider, MD  pravastatin (PRAVACHOL) 20 MG tablet Take 40 mg by mouth daily.   Yes Historical Provider, MD   BP 153/133 mmHg  Pulse 124  Temp(Src) 99.8 F (37.7 C) (Rectal)  Resp 43  SpO2 98% Physical Exam CONSTITUTIONAL: frail, ill appearing HEAD: Normocephalic/atraumatic EYES: EOMI ENMT: Mucous membranes dry NECK: supple no meningeal signs SPINE/BACK:entire spine  nontender CV: tachycardic, irregular LUNGS: tachypneic and decreased BS bilaterally ABDOMEN: soft, nontender, no rebound or guarding, bowel sounds noted throughout abdomen NEURO: Pt is awake/alert, he is nonverbal but he can follow commands   EXTREMITIES: s/p left BKA, s/p right AKA, no visible trauma SKIN: warm, color normal PSYCH: unable to assess  ED Course  Procedures  CRITICAL CARE Performed by: Joya Gaskins Total critical care time: 40 Critical care time was exclusive of separately billable procedures and treating other patients. Critical care was necessary to treat or prevent  imminent or life-threatening deterioration. Critical care was time spent personally by me on the following activities: development of treatment plan with patient and/or surrogate as well as nursing, discussions with consultants, evaluation of patient's response to treatment, examination of patient, obtaining history from patient or surrogate, ordering and performing treatments and interventions, ordering and review of laboratory studies, ordering and review of radiographic studies, pulse oximetry and re-evaluation of patient's condition. PATIENT WITH ATRIAL FIBRILLATION WITH HR>120, PT ALSO WITH NON-STEMI AND HE HAS CHF   2:15 PM Pt with atrial fibrillation requiring IV cardizem He has nonstemi likely due to demand ischemia He also has CHF Will admit 3:24 PM PT STABILIZED IN THE ED D/W OUTPATIENT CLINICS WILL ADMIT TO STEPDOWN PT IS A DNR  Labs Review Labs Reviewed  BASIC METABOLIC PANEL - Abnormal; Notable for the following:    CO2 17 (*)    Glucose, Bld 192 (*)    BUN 37 (*)    Creatinine, Ser 1.62 (*)    GFR calc non Af Amer 43 (*)    GFR calc Af Amer 50 (*)    All other components within normal limits  CBC WITH DIFFERENTIAL/PLATELET - Abnormal; Notable for the following:    WBC 12.5 (*)    Neutrophils Relative % 80 (*)    Neutro Abs 10.1 (*)    All other components within normal limits  URINALYSIS, ROUTINE W REFLEX MICROSCOPIC - Abnormal; Notable for the following:    Color, Urine AMBER (*)    APPearance CLOUDY (*)    Glucose, UA 100 (*)    Hgb urine dipstick MODERATE (*)    Bilirubin Urine SMALL (*)    Ketones, ur 15 (*)    Protein, ur >300 (*)    All other components within normal limits  DIGOXIN LEVEL - Abnormal; Notable for the following:    Digoxin Level 0.2 (*)    All other components within normal limits  URINE MICROSCOPIC-ADD ON - Abnormal; Notable for the following:    Squamous Epithelial / LPF FEW (*)    Bacteria, UA FEW (*)    All other components within  normal limits  I-STAT TROPOININ, ED - Abnormal; Notable for the following:    Troponin i, poc 0.79 (*)    All other components within normal limits  URINE CULTURE    Imaging Review Dg Chest Portable 1 View  09/11/2014   CLINICAL DATA:  History of CVA, diabetes and previous MI, current hyperglycemia, previous history of tobacco use  EXAM: PORTABLE CHEST - 1 VIEW  COMPARISON:  None.  FINDINGS: Multiple portable films reveal the lungs to be hyperinflated. There are pleural effusions layering posteriorly, bilaterally. The cardiac silhouette is enlarged. The pulmonary vascularity is engorged. The mediastinum is normal in width.  IMPRESSION: COPD with superimposed CHF with mild interstitial edema. There is no alveolar pneumonia.   Electronically Signed   By: David  [place]   On: 09/11/2014 13:24  EKG Interpretation   Date/Time:  Thursday September 11 2014 12:08:33 EDT Ventricular Rate:  121 PR Interval:    QRS Duration: 99 QT Interval:  313 QTC Calculation: 444 R Axis:   81 Text Interpretation:  Atrial fibrillation Ventricular premature complex  Probable anterior infarct, age indeterminate Abnormal T, consider  ischemia, inferior leads Lateral leads are also involved No previous ECGs  available Confirmed by Bebe Shaggy  MD, Raphael Fitzpatrick ([id number]) on 09/11/2014 12:12:56  PM     Medications  furosemide (LASIX) injection 40 mg (not administered)  diltiazem (CARDIZEM) 100 mg in dextrose 5 % 100 mL (1 mg/mL) infusion (7.5 mg/hr Intravenous Rate/Dose Change 09/11/14 1457)    MDM   Final diagnoses:  Atrial fibrillation with rapid ventricular response  Acute systolic congestive heart failure  AKI (acute kidney injury)  Non-STEMI (non-ST elevated myocardial infarction)    Nursing notes including past medical history and social history reviewed and considered in documentation xrays/imaging reviewed by myself and considered during evaluation Labs/vital reviewed myself and considered during  evaluation     Zadie Rhine, MD 09/11/14 1525

## 2014-09-11 NOTE — ED Notes (Addendum)
Spoke with pt POA and sister Olegario Messier [phone] that pt is incontinent and if given lasix would prefer condom cath instead of indwelling foley.

## 2014-09-11 NOTE — Progress Notes (Signed)
ANTICOAGULATION CONSULT NOTE - Initial Consult  Pharmacy Consult for apixaban  Indication: atrial fibrillation  No Known Allergies  Patient Measurements:    Vital Signs: Temp: 98.1 F (36.7 C) (04/07 1706) Temp Source: Oral (04/07 1706) BP: 206/159 mmHg (04/07 1745) Pulse Rate: 82 (04/07 1745)  Labs:  Recent Labs  09/11/14 1329  HGB 15.1  HCT 44.6  PLT 214  CREATININE 1.62*    CrCl cannot be calculated (Unknown ideal weight.).   Medical History: Past Medical History  Diagnosis Date  . Diabetes mellitus without complication   . Stroke   . MI (myocardial infarction)   . 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   Pharmacy consulted to begin apixaban for afib. Patient meets 2 of the 3 requirements for dose adjustment (wt 45kg, scr>1.5)  This patients CHA2DS2-VASc Score and unadjusted Ischemic Stroke Rate (% per year) is equal to 9.7 % stroke rate/year from a score of 6  Above score calculated as 1 point each if present [CHF, HTN, DM, Vascular=MI/PAD/Aortic Plaque, Age if 65, or Male] Above score calculated as 2 points each if present [Age > 65, or Stroke/TIA/TE]   Goal of Therapy:  Appropriate anticoagulation Monitor platelets by anticoagulation protocol: Yes   Plan:  Apixaban 2.5mg  bid Patient's caregiver educated  Sheppard Coil PharmD., BCPS Clinical Pharmacist Pager [phone] 09/11/2014 6:14 PM

## 2014-09-11 NOTE — Progress Notes (Signed)
Pt's sister, who is also the POA for pt, called and asked to speak to me about making him a "XXX" in the system. I explained to her that that should have been discussed down in admitting before pt was brought up to [place]. She said she did mention it to "the lady downstairs". Pt's sister does not want other family members to know why patient is in the hospital. I told her, for tonight, I could set up a password so only those with the password could find out information; those without would have to call the sister. She was ok with this and a password was set up. Will pass along this information to the next oncoming nurse.

## 2014-09-11 NOTE — ED Notes (Signed)
Pt physically moved into room A9 at this time; pt placed on monitor, continuous pulse oximetry and blood pressure cuff

## 2014-09-11 NOTE — Consult Note (Signed)
CARDIOLOGY CONSULT NOTE   Patient ID: [name] MRN: [id number], DOB/AGE: 1950-11-16   Admit date: 09/11/2014 Date of Consult: 09/11/2014   Primary Physician: No primary care provider on file. Primary Cardiologist: Thurmon Fair, MD  THIS ADMISSION WAS PLACED IN A DUPLICATE MEDICAL RECORD. THE PATIENT'S FIRST MRN IS [id number]  Pt. Profile  64M smoker with DM, HTN, remove CVA c/b expressive aphasia and right hemiparesis, PAD s/p  R AKA and L BKA, COPD, permanent AF on apixaban, and LV systolic dysfunction (EF 25-30% in 2013) with no prior caths or ICD due to co-morbidities and functional status who presents with CHF, AF with RVR, and NSTEMI.   Problem List  Past Medical History  Diagnosis Date  . Diabetes mellitus without complication   . Stroke   . MI (myocardial infarction)   . Hypertension     Past Surgical History  Procedure Laterality Date  . Below knee leg amputation Left 2008  . Above knee leg amputation Right 2008     Allergies  No Known Allergies  HPI   64M smoker with DM, HTN, remove CVA c/b expressive aphasia and right hemiparesis, PAD s/p  R AKA and L BKA, COPD, permanent AF on apixaban, and LV systolic dysfunction (EF 25-30% in 2013) with no prior caths or ICD due to co-morbidities and functional status who presents with CHF, AF with RVR, and NSTEMI.   History obtained via a combination of primary team and limited communication with patient. The patient has had a 2 day history of progressive dyspnea, tachypnea, cough, and choking after eating. He has had frequent blood sugar fluctuations and and has not been acting himself. For those reasons, he was brought to the ER for evaluation. He was then admitted to the medical service.   Initial evaluation Labs were notable for Mg 1.7, BNP 2342, TnI 1.78, TSH 0.252 (LLN 0.350), Cr 1.62 (0.92 in July).  He has been very hypertensive with occasional excursions to 180s-200s. CXR demonstrated hyperinflated lungs, bilateral  effusions, and pulmonary edema.  HRs have been in the 100-130bpm range.    On my evaluation, the patient indorsed dyspnea and chest pain (based on nodding and gesturing). He did not report chest pain to any other provider. It was difficult to tell if there is current chest pain.   Of note, the patient's sister is power of attorney   Inpatient Medications  . apixaban  2.5 mg Oral BID  . calcitRIOL  0.25 mcg Oral Daily  . [START ON 09/12/2014] carvedilol  3.125 mg Oral BID WC  . digoxin  0.125 mg Oral Daily  . furosemide  40 mg Intravenous BID  . [START ON 09/12/2014] insulin aspart  0-9 Units Subcutaneous TID WC  . pravastatin  40 mg Oral q1800    Family History No family history on file.   Social History History   Social History  . Marital Status: Single    Spouse Name: N/A  . Number of Children: N/A  . Years of Education: N/A   Occupational History  . Not on file.   Social History Main Topics  . Smoking status: Former Games developer  . Smokeless tobacco: Not on file  . Alcohol Use: 0.0 oz/week    0 Standard drinks or equivalent per week     Comment: Quit 8 years ago  . Drug Use: Yes    Special: Cocaine, IV     Comment: Quit 8 years ago  . Sexual Activity: Not on file  Other Topics Concern  . Not on file   Social History Narrative   Patient lives with sister [name])     Review of Systems  Unable to obtain.  Physical Exam  Blood pressure 154/98, pulse 90, temperature 97.3 F (36.3 C), temperature source Oral, resp. rate 27, SpO2 100 %.  General: Pleasant, NAD, occasional dry cough Psych: Pleasant Neuro: Alert. RUE paresis. Extreme expressive aphasia. Able to gesture with left hand. HEENT: Normal  Neck: Supple without bruits. JVP 12-13cmH20.  Lungs:  Resp regular and unlabored, CTA. Heart: Tachy, irregularly irregular, 3/6 holosystolic murmur at the base Abdomen: Soft, non-tender, non-distended, BS + x 4.  Extremities: No clubbing, cyanosis or edema. S/p R AKA and  L BKA. 1+ radials  Labs   Recent Labs  09/11/14 1938  TROPONINI 1.78*   Lab Results  Component Value Date   WBC 12.5* 09/11/2014   HGB 15.1 09/11/2014   HCT 44.6 09/11/2014   MCV 86.8 09/11/2014   PLT 214 09/11/2014    Recent Labs Lab 09/11/14 1329 09/11/14 1938  NA 138  --   K 4.3  --   CL 108  --   CO2 17*  --   BUN 37*  --   CREATININE 1.62*  --   CALCIUM 9.2  --   PROT  --  7.0  BILITOT  --  1.3*  ALKPHOS  --  81  ALT  --  18  AST  --  27  GLUCOSE 192*  --    Lab Results  Component Value Date   CHOL 145 09/11/2014   HDL 29* 09/11/2014   LDLCALC 96 09/11/2014   TRIG 98 09/11/2014   No results found for: DDIMER  Radiology/Studies  Dg Chest Portable 1 View  09/11/2014   CLINICAL DATA:  History of CVA, diabetes and previous MI, current hyperglycemia, previous history of tobacco use  EXAM: PORTABLE CHEST - 1 VIEW  COMPARISON:  None.  FINDINGS: Multiple portable films reveal the lungs to be hyperinflated. There are pleural effusions layering posteriorly, bilaterally. The cardiac silhouette is enlarged. The pulmonary vascularity is engorged. The mediastinum is normal in width.  IMPRESSION: COPD with superimposed CHF with mild interstitial edema. There is no alveolar pneumonia.   Electronically Signed   By: David  [place]   On: 09/11/2014 13:24   02/27/12 TTE - Left ventricle: Wall thickness was increased in a pattern of mild LVH. Systolic function was severely reduced. The estimated ejection fraction was in the range of 25% to 30%. Indeterminant diastolic function, atrial fibrillation. Diffuse hypokinesis. - Aortic valve: There was no stenosis. - Mitral valve: Trivial regurgitation. - Left atrium: The atrium was moderately dilated. - Right ventricle: The cavity size was normal. Systolic function was mildly reduced. - Right atrium: The atrium was moderately dilated. - Pulmonary arteries: No complete TR doppler jet so unable to estimate PA systolic  pressure. - Systemic veins: IVC measured 2.1 cm with some respirophasic variation, suggesting RA pressure 10 mmHg. Impressions:  - Normal LV size with mild LV hypertrophy. EF 25-30% with global hypokinesis. Normal RV size with mildly decreased systolic function. No significant valvular abnormalities.  ECG  10/14/13 AF with RVR, rate 116, LVH, prolonged QT. NSSTTWC.PVC 09/11/14 @ 12:08: AF with RVR, rate 121. Inferior STD and TWI, lateral TWI. 1mm STE in aVR. 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 Evaluation has also yielded evidence of mild AKI, borderline hyperthyroidism, and poorly controlled HTN.   Acute on chronic systolic dysfunction. History of LV systolic dysfunction. Cath and ICD deferred given functional status and co-morbidities. Has not had much in the way of HF exacerbation in the past by chart review. Possible precipitants include ischemia, hyperthyroidism, poorly controlled HTN, and AF with RVR. He is warm and wet by exam.  1. Lasix  IV BID 2. Would increase coreg to 6.25mg  BID given hypertension.  3. TTE 4. Hold lisinopril given AKI  NSTEMI Unclear if Type I or Type II in the setting of CHF, poorly controlled HTN, or AF with RVR. Tn trend will be helpful in this determination. Although he may not be a cath candidate, aggressive medical management is reasonable at this juncture.  1. Hold AM apixiban and start heparin (d/w pharmacy) 2. ASA  x1 --> ASA 81 daily 3. Start atorvastatin  daily 4. Coreg as per above 5. TTE 6. Cycle troponins.  7. PRN IV NTG for CP  AF with RVR. CHADSVASC = 6 (CHF, HTN, DM, CVA, PAD) AF is chronic. He is on long term apixaban.  1. Convert  apixaban to heparin 2. Coreg as per above with PRN metoprolol (  IV) for HR >80 3. Check T4 given low TSH  4. Continue digoxin; please check a level given AKI  HTN. Goal in this setting 140-150 1. Coreg as per above. Would uptitrate as needed.  2. IV NTG can be used for BP control (if needed) during coreg uptitration 3. Hold lisinopril given AKI   Signed, Scott Luis, MD 09/11/2014, 10:29 PM

## 2014-09-11 NOTE — ED Notes (Signed)
Per EMS they were sent out to home for high blood sugar. Pt blood sugar on arrival was 150. Pt is non verbal at baseline. Hx stroke, MI, DM. Pt is R & L AKA. Pt noted by EMS to be in Afib on monitor.

## 2014-09-12 ENCOUNTER — Inpatient Hospital Stay (HOSPITAL_COMMUNITY): Payer: Medicare Other

## 2014-09-12 DIAGNOSIS — I255 Ischemic cardiomyopathy: Secondary | ICD-10-CM

## 2014-09-12 DIAGNOSIS — I110 Hypertensive heart disease with heart failure: Secondary | ICD-10-CM

## 2014-09-12 DIAGNOSIS — I5043 Acute on chronic combined systolic (congestive) and diastolic (congestive) heart failure: Secondary | ICD-10-CM

## 2014-09-12 DIAGNOSIS — I5030 Unspecified diastolic (congestive) heart failure: Secondary | ICD-10-CM

## 2014-09-12 DIAGNOSIS — R0600 Dyspnea, unspecified: Secondary | ICD-10-CM

## 2014-09-12 DIAGNOSIS — I214 Non-ST elevation (NSTEMI) myocardial infarction: Secondary | ICD-10-CM | POA: Diagnosis present

## 2014-09-12 LAB — HEPATITIS PANEL, ACUTE
HCV Ab: REACTIVE — AB
HEP B C IGM: NONREACTIVE
HEP B S AG: NEGATIVE
Hep A IgM: NONREACTIVE

## 2014-09-12 LAB — GLUCOSE, CAPILLARY
GLUCOSE-CAPILLARY: 221 mg/dL — AB (ref 70–99)
GLUCOSE-CAPILLARY: 112 mg/dL — AB (ref 70–99)
Glucose-Capillary: 180 mg/dL — ABNORMAL HIGH (ref 70–99)
Glucose-Capillary: 277 mg/dL — ABNORMAL HIGH (ref 70–99)

## 2014-09-12 LAB — DIGOXIN LEVEL: Digoxin Level: 0.3 ng/mL — ABNORMAL LOW (ref 0.8–2.0)

## 2014-09-12 LAB — URINE CULTURE
COLONY COUNT: NO GROWTH
Culture: NO GROWTH

## 2014-09-12 LAB — CBC
HCT: 42 % (ref 39.0–52.0)
HEMOGLOBIN: 13.9 g/dL (ref 13.0–17.0)
MCH: 29.7 pg (ref 26.0–34.0)
MCHC: 33.1 g/dL (ref 30.0–36.0)
MCV: 89.7 fL (ref 78.0–100.0)
Platelets: 190 10*3/uL (ref 150–400)
RBC: 4.68 MIL/uL (ref 4.22–5.81)
RDW: 14.7 % (ref 11.5–15.5)
WBC: 13.7 10*3/uL — ABNORMAL HIGH (ref 4.0–10.5)

## 2014-09-12 LAB — BASIC METABOLIC PANEL
ANION GAP: 11 (ref 5–15)
BUN: 36 mg/dL — AB (ref 6–23)
CO2: 17 mmol/L — ABNORMAL LOW (ref 19–32)
Calcium: 8.8 mg/dL (ref 8.4–10.5)
Chloride: 113 mmol/L — ABNORMAL HIGH (ref 96–112)
Creatinine, Ser: 1.5 mg/dL — ABNORMAL HIGH (ref 0.50–1.35)
GFR calc Af Amer: 55 mL/min — ABNORMAL LOW (ref >90)
GFR, EST NON AFRICAN AMERICAN: 47 mL/min — AB (ref >90)
Glucose, Bld: 181 mg/dL — ABNORMAL HIGH (ref 70–99)
Potassium: 4.7 mmol/L (ref 3.5–5.1)
Sodium: 141 mmol/L (ref 135–145)

## 2014-09-12 LAB — TROPONIN I
Troponin I: 1.66 ng/mL (ref <0.031)
Troponin I: 0.94 ng/mL (ref <0.031)

## 2014-09-12 LAB — APTT: aPTT: 40 seconds — ABNORMAL HIGH (ref 24–37)

## 2014-09-12 LAB — HEPARIN LEVEL (UNFRACTIONATED): Heparin Unfractionated: 0.64 IU/mL (ref 0.30–0.70)

## 2014-09-12 LAB — T4, FREE: Free T4: 1.84 ng/dL — ABNORMAL HIGH (ref 0.80–1.80)

## 2014-09-12 LAB — HIV ANTIBODY (ROUTINE TESTING W REFLEX): HIV SCREEN 4TH GENERATION: NONREACTIVE

## 2014-09-12 MED ORDER — ASPIRIN EC 81 MG PO TBEC
81.0000 mg | DELAYED_RELEASE_TABLET | Freq: Every day | ORAL | Status: DC
  Administered 2014-09-12 – 2014-09-17 (×6): 81 mg via ORAL
  Filled 2014-09-12 (×6): qty 1

## 2014-09-12 MED ORDER — DIGOXIN 250 MCG PO TABS
0.2500 mg | ORAL_TABLET | Freq: Once | ORAL | Status: AC
  Administered 2014-09-12: 0.25 mg via ORAL
  Filled 2014-09-12: qty 1

## 2014-09-12 MED ORDER — PERFLUTREN LIPID MICROSPHERE
1.0000 mL | INTRAVENOUS | Status: DC | PRN
  Administered 2014-09-12: 4 mL via INTRAVENOUS
  Filled 2014-09-12 (×3): qty 10

## 2014-09-12 MED ORDER — HEPARIN (PORCINE) IN NACL 100-0.45 UNIT/ML-% IJ SOLN
650.0000 [IU]/h | INTRAMUSCULAR | Status: DC
  Administered 2014-09-12: 500 [IU]/h via INTRAVENOUS
  Filled 2014-09-12 (×3): qty 250

## 2014-09-12 MED ORDER — NITROGLYCERIN IN D5W 200-5 MCG/ML-% IV SOLN
0.0000 ug/min | INTRAVENOUS | Status: DC
  Administered 2014-09-12: 5 ug/min via INTRAVENOUS
  Filled 2014-09-12: qty 250

## 2014-09-12 NOTE — Progress Notes (Signed)
  Echocardiogram 2D Echocardiogram has been performed.  Janalyn Harder 09/12/2014, 2:51 PM

## 2014-09-12 NOTE — Progress Notes (Signed)
Subjective: Patient looks improved since evaluation yesterday evening, sitting up in bed. No longer requiring [place] O2 and appears to be in less distress. Patient had elevated troponin to 1.78 (from 0.79 on admission) overnight prompting cardiology evaluation. Denying any chest pain or abdominal pain. Dyspnea apparently resolved or improving.   Objective: Vital signs in last 24 hours: Filed Vitals:   09/12/14 1030 09/12/14 1100 09/12/14 1116 09/12/14 1200  BP: 138/83 138/89 141/63 112/84  Pulse: 77 116 81 56  Temp:   97.1 F (36.2 C)   TempSrc:   Oral   Resp: 33 33 25 19  Height:      Weight:      SpO2: 93% 90% 96% 100%   Weight change:   Intake/Output Summary (Last 24 hours) at 09/12/14 1413 Last data filed at 09/12/14 1200  Gross per 24 hour  Intake 478.98 ml  Output   1875 ml  Net -1396.02 ml   General: resting in bed HEENT: PERRL, EOMI, no scleral icterus Cardiac: RRR, no rubs, murmurs or gallops Pulm: poor inspiratory effort, decreased breath sounds on left, right side not assessed Abd: soft, nontender, nondistended, BS present Ext: s/p left BKA, s/p right AKA, no edema, Neuro: awake alert, expressive aphasia, can follow commands  Lab Results: Basic Metabolic Panel:  Recent Labs  16/10/96 1329 09/11/14 1938 09/12/14 0215  NA 138  --  141  K 4.3  --  4.7  CL 108  --  113*  CO2 17*  --  17*  GLUCOSE 192*  --  181*  BUN 37*  --  36*  CREATININE 1.62*  --  1.50*  CALCIUM 9.2  --  8.8  MG  --  1.7  --   PHOS  --  4.0  --    Liver Function Tests:  Recent Labs  09/11/14 1938  AST 27  ALT 18  ALKPHOS 81  BILITOT 1.3*  PROT 7.0  ALBUMIN 3.1*   CBC:  Recent Labs  09/11/14 1329 09/12/14 0215  WBC 12.5* 13.7*  NEUTROABS 10.1*  --   HGB 15.1 13.9  HCT 44.6 42.0  MCV 86.8 89.7  PLT 214 190   Cardiac Enzymes:  Recent Labs  09/11/14 1938 09/11/14 2325 09/12/14 0958  TROPONINI 1.78* 1.66* 0.94*   CBG:  Recent Labs  09/11/14 1755  09/11/14 2129 09/12/14 0745  GLUCAP 164* 199* 180*  Fasting Lipid Panel:  Recent Labs  09/11/14 1938  CHOL 145  HDL 29*  LDLCALC 96  TRIG 98  CHOLHDL 5.0   Thyroid Function Tests:  Recent Labs  09/11/14 1938  TSH 0.252*   Urinalysis:  Recent Labs  09/11/14 1406  COLORURINE AMBER*  LABSPEC 1.028  PHURINE 5.0  GLUCOSEU 100*  HGBUR MODERATE*  BILIRUBINUR SMALL*  KETONESUR 15*  PROTEINUR >300*  UROBILINOGEN 1.0  NITRITE NEGATIVE  LEUKOCYTESUR NEGATIVE   Micro Results: Recent Results (from the past 240 hour(s))  Urine culture     Status: None   Collection Time: 09/11/14  2:06 PM  Result Value Ref Range Status   Specimen Description URINE, CATHETERIZED  Final   Special Requests NONE  Final   Colony Count NO GROWTH Performed at [facility]   Final   Culture NO GROWTH Performed at [facility]   Final   Report Status 09/12/2014 FINAL  Final  MRSA PCR Screening     Status: None   Collection Time: 09/11/14  4:56 PM  Result Value Ref Range Status  MRSA by PCR NEGATIVE NEGATIVE Final    Comment:        The GeneXpert MRSA Assay (FDA approved for NASAL specimens only), is one component of a comprehensive MRSA colonization surveillance program. It is not intended to diagnose MRSA infection nor to guide or monitor treatment for MRSA infections.    Studies/Results: Dg Chest 2 View  09/12/2014   CLINICAL DATA:  Acute on chronic congestive heart failure. Atrial fibrillation.  EXAM: CHEST  2 VIEW  COMPARISON:  Single view of the chest 09/11/2014.  FINDINGS: There is cardiomegaly without evidence pulmonary edema. Small to moderate pleural effusions persist, larger on the right. The lungs appear emphysematous.  IMPRESSION: Small to moderate pleural effusions and basilar atelectasis, worse on the right.  Cardiomegaly without edema.  Emphysema.   Electronically Signed   By: Drusilla Kanner M.D.   On: 09/12/2014 07:38   Dg Chest Portable 1  View  09/11/2014   CLINICAL DATA:  History of CVA, diabetes and previous MI, current hyperglycemia, previous history of tobacco use  EXAM: PORTABLE CHEST - 1 VIEW  COMPARISON:  None.  FINDINGS: Multiple portable films reveal the lungs to be hyperinflated. There are pleural effusions layering posteriorly, bilaterally. The cardiac silhouette is enlarged. The pulmonary vascularity is engorged. The mediastinum is normal in width.  IMPRESSION: COPD with superimposed CHF with mild interstitial edema. There is no alveolar pneumonia.   Electronically Signed   By: David  [place]   On: 09/11/2014 13:24   Medications: I have reviewed the patient's current medications. Scheduled Meds: . aspirin EC  81 mg Oral Daily  . atorvastatin  80 mg Oral q1800  . calcitRIOL  0.25 mcg Oral Daily  . carvedilol  6.25 mg Oral BID WC  . digoxin  0.125 mg Oral Daily  . furosemide  40 mg Intravenous BID  . insulin aspart  0-9 Units Subcutaneous TID WC   Continuous Infusions: . sodium chloride 10 mL/hr at 09/12/14 1011  . heparin 500 Units/hr (09/12/14 1008)  . nitroGLYCERIN 20 mcg/min (09/12/14 1042)   PRN Meds:.acetaminophen **OR** acetaminophen, albuterol, metoprolol Assessment/Plan: Principal Problem:   Atrial fibrillation with rapid ventricular response Active Problems:   Hypertension   COPD (chronic obstructive pulmonary disease)   T2DM (type 2 diabetes mellitus)   PAD (peripheral artery disease)   Hemiparesis affecting right side as late effect of cerebrovascular accident   Amputee of extremity   Aphasia as late effect of cerebrovascular accident   AKI (acute kidney injury)   Cardiomyopathy, ischemic   Acute on chronic combined systolic and diastolic heart failure - secondary to accelerated hypertension and A. fib RVR   Accelerated hypertension with diastolic congestive heart failure, NYHA class 3   Non-STEMI (non-ST elevated myocardial infarction)  Atrial fibrillation w/ RVR:  CHADSVASC=6 (CHF, HTN, DM,  CVA, PAD), w/ chronic Afib. Patient takes apixaban at home. TSH low (0.25) - follow-up T4 in setting of low TSH - per cardiology recs, convert apixaban to heparin in setting of ACS - coreg 6.25mg  BID, PRN metorolol (5mg  IV) for HR>80 - continue digoxin - continue tele - check digoxin level in setting of AKI  Acute on chronic combined systolic and diastolic HF: Exacerbation possible precipitated by ischemia, hyperthyroidism (TSH 0.35), poorly controlled HTN, or Afib with RVR. Dyspnea resolving. Per CXR on 4/8, no evidence of pulmonary edema, small to moderate pleural effusions and basilar atelectasis, worse on the right. Patient diuresed since admission. - con't furosemide 40mg  IV BID, will reassess fluid  status on 4/9  - follow-up TTE for 4/8 - increase coreg to 6.25mg  BID in setting of HTN - per cardiology recs, restart lisinopril upon resolution of AKI - IV nitroglycerin, titrate for BP  NSTEMI: Troponins cycled, 0.79 at admission, 1.78 at 19:38, 1.66 at 23:25, most recent check 0.94. Type I (spontaneous) related to atherosclerotic plaque rupture vs Type II (secondary to ischemic imbalance) 2/2 demand mismatch in setting of a fib w/ RVR, poorly controlled HTN, CHF. Troponin trend may be more c/w ACS, though can also see in setting of increased demand. Per previous evaluations, not a candidate for catheterization, pursuing aggressive medical management. Per cardiology, ASA  x1, no need to continue trending troponins in setting of down-trending. - conversion to IV heparin from apixiban for 48-72 hours pending resolution of symptoms - continue ASA 81 mg - start atorvastatin  daily, follow-up on d/c statin  - TTE - IV NTG for CP - titrate for CP/BP  HTN: Per cardiology recs, goal SBP is 140-150. Patient hypertensive o/n but pressures more reassuring on 4/8, in 130s.  - d/c home metoprolol  BID - carvedilol 6.25 BID, per cardiology recs, consider titrating up prn - hold  lisinopril in setting of AKI - restart with resolution of AKI in coordination with NTG wean  AKI: Creatinine 1.62 on admission, 1.5 on 4/8. no h/o renal dysfunction. Holding fluids in setting of concern for CHF exacerbation 2/2 a fib.  - morning BMP - hold lisinopril - avoid NSAIDs  COPD w/ emphysema: patient is former smoker, hyperinflation appreciated on CXR. Episodes of coughing and interstial edema appreciated on CXR could be 2/2 CHF or COPD exacerbation, more likely CHF in setting of a fib with RVR. - nebulizer prn  T2DM: Last A1C May 2015, 10. Sister reports unstable BG for past few days with reduced PO intake.  - sensitive sliding scale insulin with meals - hold home oral medications - follow-up A1C - monitor BG  Cardiomyopathy: Patient was started on carvedilol by cardiologist in 06/2013. Previously labeled as nonischemic cardiomyopathy but, per Dr. Royann Shivers (cardiologist) likely underlying CAD.   Diet: carb and hear diet DVT Ppx: patient on home apixaban Code: DNR, confirmed with sister (POA)  Dispo: Disposition is deferred at this time, awaiting improvement of current medical problems. Anticipated discharge in approximately 1 day(s).   The patient does not have a current PCP (No primary care provider on file.) and does need an [facility] hospital follow-up appointment after discharge.  The patient does not have transportation limitations that hinder transportation to clinic appointments. .Services Needed at time of discharge: Y = Yes, Blank = No PT:   OT:   RN:   Equipment:   Other:     LOS: 1 day   Leveda Anna, Med Student 09/12/2014, 2:13 PM

## 2014-09-12 NOTE — Progress Notes (Signed)
Internal Medicine Attending  Date: 09/12/2014  Patient name: [name] Medical record number: [id number] Date of birth: [birth year] Age: 64 y.o. Gender: male  I saw and evaluated the patient, and discussed his care with resident on A.M rounds; I reviewed the resident's note by Dr. Johna Roles and I agree with the resident's findings and plans as documented in her note.

## 2014-09-12 NOTE — Progress Notes (Signed)
Utilization Review Completed.  

## 2014-09-12 NOTE — Progress Notes (Signed)
ANTICOAGULATION CONSULT NOTE - Initial Consult  Pharmacy Consult for heparin Indication: chest pain/ACS  No Known Allergies  Patient Measurements: Height: 5\' 6"  (167.6 cm) Weight: 97 lb (44 kg) IBW/kg (Calculated) : 63.8 Heparin Dosing Weight: 44 kg  Vital Signs: Temp: 97.3 F (36.3 C) (04/08 0747) Temp Source: Oral (04/08 0747) BP: 162/96 mmHg (04/08 0800) Pulse Rate: 39 (04/08 0800)  Labs:  Recent Labs  09/11/14 1329 09/11/14 1938 09/11/14 2325 09/12/14 0215  HGB 15.1  --   --  13.9  HCT 44.6  --   --  42.0  PLT 214  --   --  190  CREATININE 1.62*  --   --  1.50*  TROPONINI  --  1.78* 1.66*  --     Estimated Creatinine Clearance: 31 mL/min (by C-G formula based on Cr of 1.5).   Medical History: Past Medical History  Diagnosis Date  . Diabetes mellitus without complication   . Stroke   . MI (myocardial infarction)   . Hypertension    Assessment:  80 YOM admitted w/ CP s/p asystole/PEA arrest. Not deemed candidate for cath d/t prior CVA w/ R sided hemiparesis and expressive aphasia. Per MD note, pt on apixaban PTA for permanent afib, last dose 4/7 @ 1944. H/H 13.9/42. Plt 190. No bleeding noted. Pharmacy consulted to dose heparin for ACS.   Goal of Therapy:  Heparin level 0.3-0.7 units/ml Monitor platelets by anticoagulation protocol: Yes   Plan:  Start heparin infusion at 500 units/hr.  F/u heparin level @ 1600.  Discontinue apixaban.  F/u daily heparin level & CBC.  Monitor for any s/sx bleeding.   Patrina Levering 09/12/2014,9:06 AM

## 2014-09-12 NOTE — Progress Notes (Addendum)
Subjective:  Pt seen and examined in AM. Overnight pt was evaluated by cardiology and placed on nitro and heparin drip. Pt heart rate and blood pressure improved.     Objective: Vital signs in last 24 hours: Filed Vitals:   09/12/14 1030 09/12/14 1100 09/12/14 1116 09/12/14 1200  BP: 138/83 138/89 141/63 112/84  Pulse: 77 116 81 56  Temp:   97.1 F (36.2 C)   TempSrc:   Oral   Resp: 33 33 25 19  Height:      Weight:      SpO2: 93% 90% 96% 100%   Weight change:   Intake/Output Summary (Last 24 hours) at 09/12/14 1302 Last data filed at 09/12/14 1200  Gross per 24 hour  Intake 478.98 ml  Output   1875 ml  Net -1396.02 ml   PHYSICAL EXAMINATION:   General: NAD Heart: Irregularly irregular rhythm with normal heart rate.  Lungs: Decreased BS at bases with no wheezing, rales, or ronchi Abdomen: Soft, non-distended, with normal BS Extremities: Left BKA and right AKA Neuro: Right sided flaccidity, no CN deficit    Lab Results: Basic Metabolic Panel:  Recent Labs Lab 09/11/14 1329 09/11/14 1938 09/12/14 0215  NA 138  --  141  K 4.3  --  4.7  CL 108  --  113*  CO2 17*  --  17*  GLUCOSE 192*  --  181*  BUN 37*  --  36*  CREATININE 1.62*  --  1.50*  CALCIUM 9.2  --  8.8  MG  --  1.7  --   PHOS  --  4.0  --    Liver Function Tests:  Recent Labs Lab 09/11/14 1938  AST 27  ALT 18  ALKPHOS 81  BILITOT 1.3*  PROT 7.0  ALBUMIN 3.1*   No results for input(s): LIPASE, AMYLASE in the last 168 hours. No results for input(s): AMMONIA in the last 168 hours. CBC:  Recent Labs Lab 09/11/14 1329 09/12/14 0215  WBC 12.5* 13.7*  NEUTROABS 10.1*  --   HGB 15.1 13.9  HCT 44.6 42.0  MCV 86.8 89.7  PLT 214 190   Cardiac Enzymes:  Recent Labs Lab 09/11/14 1938 09/11/14 2325 09/12/14 0958  TROPONINI 1.78* 1.66* 0.94*   BNP: No results for input(s): PROBNP in the last 168 hours. D-Dimer: No results for input(s): DDIMER in the last 168  hours. CBG:  Recent Labs Lab 09/11/14 1755 09/11/14 2129 09/12/14 0745  GLUCAP 164* 199* 180*   Hemoglobin A1C: No results for input(s): HGBA1C in the last 168 hours. Fasting Lipid Panel:  Recent Labs Lab 09/11/14 1938  CHOL 145  HDL 29*  LDLCALC 96  TRIG 98  CHOLHDL 5.0   Thyroid Function Tests:  Recent Labs Lab 09/11/14 1938  TSH 0.252*   Coagulation: No results for input(s): LABPROT, INR in the last 168 hours. Anemia Panel: No results for input(s): VITAMINB12, FOLATE, FERRITIN, TIBC, IRON, RETICCTPCT in the last 168 hours. Urine Drug Screen: Drugs of Abuse  No results found for: LABOPIA, COCAINSCRNUR, LABBENZ, AMPHETMU, THCU, LABBARB  Alcohol Level: No results for input(s): ETH in the last 168 hours. Urinalysis:  Recent Labs Lab 09/11/14 1406  COLORURINE AMBER*  LABSPEC 1.028  PHURINE 5.0  GLUCOSEU 100*  HGBUR MODERATE*  BILIRUBINUR SMALL*  KETONESUR 15*  PROTEINUR >300*  UROBILINOGEN 1.0  NITRITE NEGATIVE  LEUKOCYTESUR NEGATIVE    Micro Results: Recent Results (from the past 240 hour(s))  Urine culture  Status: None   Collection Time: 09/11/14  2:06 PM  Result Value Ref Range Status   Specimen Description URINE, CATHETERIZED  Final   Special Requests NONE  Final   Colony Count NO GROWTH Performed at [facility]   Final   Culture NO GROWTH Performed at [facility]   Final   Report Status 09/12/2014 FINAL  Final  MRSA PCR Screening     Status: None   Collection Time: 09/11/14  4:56 PM  Result Value Ref Range Status   MRSA by PCR NEGATIVE NEGATIVE Final    Comment:        The GeneXpert MRSA Assay (FDA approved for NASAL specimens only), is one component of a comprehensive MRSA colonization surveillance program. It is not intended to diagnose MRSA infection nor to guide or monitor treatment for MRSA infections.    Studies/Results: Dg Chest 2 View  09/12/2014   CLINICAL DATA:  Acute on chronic congestive heart  failure. Atrial fibrillation.  EXAM: CHEST  2 VIEW  COMPARISON:  Single view of the chest 09/11/2014.  FINDINGS: There is cardiomegaly without evidence pulmonary edema. Small to moderate pleural effusions persist, larger on the right. The lungs appear emphysematous.  IMPRESSION: Small to moderate pleural effusions and basilar atelectasis, worse on the right.  Cardiomegaly without edema.  Emphysema.   Electronically Signed   By: Drusilla Kanner M.D.   On: 09/12/2014 07:38   Dg Chest Portable 1 View  09/11/2014   CLINICAL DATA:  History of CVA, diabetes and previous MI, current hyperglycemia, previous history of tobacco use  EXAM: PORTABLE CHEST - 1 VIEW  COMPARISON:  None.  FINDINGS: Multiple portable films reveal the lungs to be hyperinflated. There are pleural effusions layering posteriorly, bilaterally. The cardiac silhouette is enlarged. The pulmonary vascularity is engorged. The mediastinum is normal in width.  IMPRESSION: COPD with superimposed CHF with mild interstitial edema. There is no alveolar pneumonia.   Electronically Signed   By: David  [place]   On: 09/11/2014 13:24   Medications: I have reviewed the patient's current medications. Scheduled Meds: . aspirin EC  81 mg Oral Daily  . atorvastatin  80 mg Oral q1800  . calcitRIOL  0.25 mcg Oral Daily  . carvedilol  6.25 mg Oral BID WC  . digoxin  0.125 mg Oral Daily  . furosemide  40 mg Intravenous BID  . insulin aspart  0-9 Units Subcutaneous TID WC   Continuous Infusions: . sodium chloride 10 mL/hr at 09/12/14 1011  . heparin 500 Units/hr (09/12/14 1008)  . nitroGLYCERIN 20 mcg/min (09/12/14 1042)   PRN Meds:.acetaminophen **OR** acetaminophen, albuterol, metoprolol Assessment/Plan: Principal Problem:   Atrial fibrillation with rapid ventricular response Active Problems:   Hypertension   COPD (chronic obstructive pulmonary disease)   T2DM (type 2 diabetes mellitus)   PAD (peripheral artery disease)   Hemiparesis affecting right  side as late effect of cerebrovascular accident   Amputee of extremity   Aphasia as late effect of cerebrovascular accident   CHF (congestive heart failure)   AKI (acute kidney injury)   Cardiomyopathy, ischemic   Atrial Fibrillation with RVR - Pt in permanent afib with improved rate. Pt was at home on lopressor for rate control and eliquis for [facility] therapy. Unclear trigger, possibly due to hyperthyroidism vs AECHF vs ACS vs uncontrolled hypertension.  -Continue carvedilol 6.25 mg BID -Continue heparin drip per pharmacy  -Consider starting methimazole 5 mg TID for hyperthyroidism   Primary Hyperthyroidism - TSH 0.252 with  free T4 1.84. Pt with permanent afib with RVR on admission.  -Consider starting methimazole 5 mg TID  -Obtain T3 level and TSI level   Acute on Chronic Systolic CHF in setting of Ischemic Cardiomyopathy - Pt with last 2D-echo September 2013 with EF 25-30%, now reduced to 20-25%. BNP elevated at 2342. Chest xray with bilateral pleural effusions. -Appreciate cardiology consult -Continue IV lasix 40 mg BID  -Continue carvedilol 6.25 mg BID  -Continue digoxin 0.125 mg daily (given loading dose today of  0.25 mg x 1 due to low digoxin level) -Hold home lisinopril 40 mg daily in setting of AKI -Pt not candidate for ICD  Elevated troponin - Troponin peaked at 1.78. Etiology likely demand ischemia in setting of Afib with RVR vs NSTEMI. -Appreciate cardiology recommendations  -Continue IV heparin per pharmacy -Continue aspirin 81 mg daily  -Pt not candidate for invasive cardiac testing  Non-oliguric AKI - Cr down to 1.50 from 1.62 with last Cr 0.92 of May 2015. UA with proteinuria (3.1 mg). Etiology possibly cardiorenal syndrome in setting of reduced systolic EF.  -Monitor daily weights and strict I & O's  -Monitor BMP  -Hold home lisinopril 40 mg daily in setting of AKI  Hypertension - Currently mildly hypertensive. Pt was at home on lopressor 25 mg BID.  -Continue  nitro drip per cardiology  -Continue carvedilol 6.25 mg BID  -Hold home lisinopril 40 mg daily in setting of AKI  Insulin-dependent Type 2 DM - Last A1c 10 on 10/14/13. Pt at home on Lantus 0-5 U daily and SSI before meals. 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 -Continue home aspirin 81 mg daily  -Continue atorvastatin 80 mg daily   COPD - No current exacerbation. Pt not on home inhalers. No PFT's on file. -Albuterol nebulizer Q 4 Hr PRN  Hyperlipidemia -Lipid panel with LDL 96. Pt at home on pravastatin daily.  -Continue atorvastatin 80 mg daily    History of Hepatitis C infection -Per pt's sister had hepatitis C infection in past.  -Awaiting HCV VL -HIV Ab negative  Diet: Heart DVT Ppx: IV heparin  Code: DNR (confirmed by sister and form in chart)    Dispo: Disposition is deferred at this time, awaiting improvement of current medical problems.  Anticipated discharge in approximately 2-3 day(s).   The patient does have a current PCP (No primary care provider on file.) and does need an [facility] hospital follow-up appointment after discharge.  The patient does have transportation limitations that hinder transportation to clinic appointments.  .Services Needed at time of discharge: Y = Yes, Blank = No PT:   OT:   RN:   Equipment:   Other:     LOS: 1 day   Otis Brace, MD 09/12/2014, 1:02 PM

## 2014-09-12 NOTE — Progress Notes (Signed)
73M smoker with DM, HTN, remove CVA c/b expressive aphasia and right hemiparesis, PAD s/p R AKA and L BKA, COPD, permanent AF on apixaban, and LV systolic dysfunction (EF 25-30% in 2013) with no prior caths or ICD due to co-morbidities and functional status who presents with CHF, AF with RVR, and NSTEMI.  Has essentially been lost to follow-up for his cardiac evaluation.  Last seen by Dr. Royann Shivers in 2015. Before that was seen by Dr. Excell Seltzer in 2013. He hasn't long-standing cardiomyopathy that has not truly been diagnosed as ischemic versus nonischemic but hasn't had any ischemic evaluation --thought to be a very poor invasive procedure candidate in the past.  Seen by cardiology fellow overnight - recommended converting from apixaban to heparin for treatment of possible non-STEMI with medical management. Statin started. Beta blocker initiated. The patient was treated with IV Lasix - essentially net out roughly 1 L plus overnight..  With combination of beta blocker and digoxin, rate has now been controlled. Never was started on diltiazem drip.  Subjective:  No acute distress this morning. He seems to indicate that he may be having some chest discomfort, however he is not a good source of information.  Objective:  Vital Signs in the last 24 hours: Temp:  [97.1 F (36.2 C)-99.8 F (37.7 C)] 97.1 F (36.2 C) (04/08 1116) Pulse Rate:  [39-130] 56 (04/08 1200) Resp:  [19-43] 19 (04/08 1200) BP: (112-206)/(53-159) 112/84 mmHg (04/08 1200) SpO2:  [67 %-100 %] 100 % (04/08 1200) Weight:  [97 lb (44 kg)] 97 lb (44 kg) (04/08 0800)  Intake/Output from previous day: 04/07 0701 - 04/08 0700 In: 186.9 [I.V.:136.9; IV Piggyback:50] Out: 1150 [Urine:1150] Intake/Output from this shift: Total I/O In: 292.1 [P.O.:240; I.V.:52.1] Out: 725 [Urine:725]  Physical Exam: General: Pleasant, NAD, - appears to understand, but unclear. He has right hemiplegia Psych: Pleasant - to the extent that this can  be determined.B, nonlabored Neuro: Alert. RUE paresis/plegia. Extreme expressive aphasia. Able to gesture with left hand. HEENT: Normal Neck: Supple without bruits. JVP 12-13cmH20.  Lungs: Resp regular and unlabored, mild basal rales, but poor effort.Marland Kitchen Heart: Controlled rate, irregularly irregular, 3/6 HSM @ base Abdomen: Soft, non-tender, non-distended, BS + x 4.  Extremities: No clubbing, cyanosis or edema. S/p R AKA and L BKA. 1+ radials  Lab Results:  Recent Labs  09/11/14 1329 09/12/14 0215  WBC 12.5* 13.7*  HGB 15.1 13.9  PLT 214 190    Recent Labs  09/11/14 1329 09/12/14 0215  NA 138 141  K 4.3 4.7  CL 108 113*  CO2 17* 17*  GLUCOSE 192* 181*  BUN 37* 36*  CREATININE 1.62* 1.50*    Recent Labs  09/11/14 2325 09/12/14 0958  TROPONINI 1.66* 0.94*   Hepatic Function Panel  Recent Labs  09/11/14 1938  PROT 7.0  ALBUMIN 3.1*  AST 27  ALT 18  ALKPHOS 81  BILITOT 1.3*  BILIDIR 0.3  IBILI 1.0*    Recent Labs  09/11/14 1938  CHOL 145   No results for input(s): PROTIME in the last 72 hours.  Imaging: Imaging results have been reviewed  Personally reviewed Chest x-ray this morning revealed cardiomegaly with no evidence of pulmonary edema and small moderate pleural effusions R>L.  baseline emphysematous changes.  Cardiac Studies: EKG: 1. Admission EKG 4/7: A. fib with RVR, rate 121 bpm. Normal axis and intervals. Significant inferior ST depression with T-wave inversions consistent more likely with LVH than ischemia. Cannot exclude ischemia. One PVC noted.  Also repolarization changes noted in the lateral leads. - Repolarization changes are not new. Clearly LVH criteria. 2. A. M. of 4/8: Atrial fibrillation with controlled rate, 83 BPM. Poor R-wave progression with evidence of LVH. Repolarization changes and less pronounced. Lateral T-wave inversions remain present.  Assessment/Plan:  Principal Problem:   Atrial fibrillation with rapid  ventricular response Active Problems:   Cardiomyopathy, ischemic   Acute on chronic combined systolic and diastolic heart failure - secondary to accelerated hypertension and A. fib RVR   Accelerated hypertension with diastolic congestive heart failure, NYHA class 3   COPD (chronic obstructive pulmonary disease)   PAD (peripheral artery disease)   Hemiparesis affecting right side as late effect of cerebrovascular accident   Aphasia as late effect of cerebrovascular accident   Hypertension   T2DM (type 2 diabetes mellitus)   Amputee of extremity   AKI (acute kidney injury)   Very unfortunate man with significant comorbidities as noted above. Has not had much in the way of HF exacerbation in the past by chart review.  History of LV systolic dysfunction (therefore by definition diastolic dysfunction). In the past, Cath and ICD deferred given functional status and co-morbidities - I agree with this decision and feel that optimizing medical therapy is the best option. However cannot totally discount the possibility of catheterization depending on the family's wishes..   My suspicion is that we may never understand the true initial etiology of his symptoms. Clearly he had hypertensive crisis/accelerated hypertension with combined systolic and diastolic heart failure as he has known combined long-standing systolic and diastolic disease. His presentation was then further complicated by A. fib with RVR. In this setting he had positive troponins. It is difficult to tell whether the positive troponins related to demand ischemia with demand infarction versus true ACS related non-STEMI. Based on previous reports that patient has not been considered to be a PCI candidate. Would not consider chronic catheterization today as he has been on apixaban.  Acute on chronic combined systolic and diastolic heart failure.: Wet and warm. Possible precipitants include ischemia, hyperthyroidism, poorly controlled HTN, and  AF with RVR.  1. For now, would continue Lasix  IV BID for today. Reassess tomorrow. He does not appear to be nearly is volume overloaded this morning. 2. Agree with coreg to 6.25mg  BID given hypertension. I suspect that as he is diuresed his blood pressures will improve. 3. TTE - ordered today to get a good assessment of EF as well as diastolic filling pressures. 4. Currently lisinopril has been on hold secondary to AKI - would restart as soon his renal function stabilizes. We are starting nitroglycerin infusion as he has been noticing some chest discomfort; this should help with afterload reduction.  NSTEMI Unclear if Type I or Type II in the setting of CHF, poorly controlled HTN, or AF with RVR. Tn trend is potentially more consistent with ACS, however we can never really be sure as he is quickly improved to hypertensive and A. fib RVR standpoint.. Although he may not be a cath candidate, aggressive medical management is reasonable at this juncture.  1. Agree with converting apixiban to IV heparin (d/w pharmacy) - for 48-72 hours based on symptoms 2. ASA  x1 --> ASA 81 daily 3. Start atorvastatin  daily 4. Coreg as per above 5. TTE 6. Troponins are now trending down. He can stop checking  7. Will start IV NTG for CP -and titrate for chest pain/blood pressure.  AF with RVR. CHADSVASC = 6 (  CHF, HTN, DM, CVA, PAD) AF is chronic. He is on long term apixaban.  1. Convert apixaban to heparin to treat ACS 2. Coreg as per above with PRN metoprolol (  IV) for HR >80; rate now. To be well controlled. 3. Check T4 given low TSH  4. Continue digoxin; please check a level given AKI  HTN. Goal in this setting 140-150 1. Coreg as per above. Would uptitrate as needed.  2. IV NTG has been initiated 3. Hold lisinopril given AKI - restart as renal function stabilizes and we wean off nitroglycerin.    LOS: 1 day    HARDING, DAVID W 09/12/2014, 1:34 PM

## 2014-09-12 NOTE — Progress Notes (Signed)
ANTICOAGULATION CONSULT NOTE - Initial Consult  Pharmacy Consult for heparin Indication: chest pain/ACS  No Known Allergies  Patient Measurements: Height: 5\' 6"  (167.6 cm) Weight: 97 lb (44 kg) IBW/kg (Calculated) : 63.8 Heparin Dosing Weight: 44 kg  Vital Signs: Temp: 97 F (36.1 C) (04/08 1600) Temp Source: Oral (04/08 1600) BP: 142/87 mmHg (04/08 1700) Pulse Rate: 77 (04/08 1700)  Labs:  Recent Labs  09/11/14 1329 09/11/14 1938 09/11/14 2325 09/12/14 0215 09/12/14 0958 09/12/14 1550 09/12/14 1600  HGB 15.1  --   --  13.9  --   --   --   HCT 44.6  --   --  42.0  --   --   --   PLT 214  --   --  190  --   --   --   APTT  --   --   --   --   --  40*  --   HEPARINUNFRC  --   --   --   --   --   --  0.64  CREATININE 1.62*  --   --  1.50*  --   --   --   TROPONINI  --  1.78* 1.66*  --  0.94*  --   --     Estimated Creatinine Clearance: 31 mL/min (by C-G formula based on Cr of 1.5).   Medical History: Past Medical History  Diagnosis Date  . Diabetes mellitus without complication   . Stroke   . MI (myocardial infarction)   . Hypertension    Assessment:  83 YOM admitted w/ CP s/p asystole/PEA arrest. Not deemed candidate for cath d/t prior CVA w/ R sided hemiparesis and expressive aphasia. Per MD note, pt on apixaban PTA for permanent afib, last dose 4/7 @ 1944. H/H 13.9/42. Plt 190. No bleeding noted. Pharmacy consulted to dose heparin for ACS.   PM: Hep level came back at 0.64. PTT came back at 40 so HL is correct. A digoxin level was also ordered and it came back at 0.3. It's low for afib or HF so will try to give her a small load only since the current dose should be adequate for her wt (44kg)  Goal of Therapy:  Heparin level 0.3-0.7 units/ml  Monitor platelets by anticoagulation protocol: Yes     Plan:   Cont heparin at 500 units/hr Repeat let in 6 hr  Digoxin 0.25mg  x1 cont digoxin 0.125mg  qday  Ulyses Southward, PharmD Pager: [phone] 09/12/2014 5:57  PM

## 2014-09-13 DIAGNOSIS — B1920 Unspecified viral hepatitis C without hepatic coma: Secondary | ICD-10-CM

## 2014-09-13 DIAGNOSIS — I69920 Aphasia following unspecified cerebrovascular disease: Secondary | ICD-10-CM

## 2014-09-13 DIAGNOSIS — R740 Nonspecific elevation of levels of transaminase and lactic acid dehydrogenase [LDH]: Secondary | ICD-10-CM

## 2014-09-13 DIAGNOSIS — E785 Hyperlipidemia, unspecified: Secondary | ICD-10-CM

## 2014-09-13 DIAGNOSIS — R000 Tachycardia, unspecified: Secondary | ICD-10-CM

## 2014-09-13 DIAGNOSIS — I5023 Acute on chronic systolic (congestive) heart failure: Secondary | ICD-10-CM

## 2014-09-13 DIAGNOSIS — M2180 Other specified acquired deformities of unspecified limb: Secondary | ICD-10-CM

## 2014-09-13 DIAGNOSIS — E119 Type 2 diabetes mellitus without complications: Secondary | ICD-10-CM

## 2014-09-13 DIAGNOSIS — I429 Cardiomyopathy, unspecified: Secondary | ICD-10-CM

## 2014-09-13 DIAGNOSIS — J449 Chronic obstructive pulmonary disease, unspecified: Secondary | ICD-10-CM

## 2014-09-13 DIAGNOSIS — E0590 Thyrotoxicosis, unspecified without thyrotoxic crisis or storm: Secondary | ICD-10-CM

## 2014-09-13 LAB — BASIC METABOLIC PANEL
Anion gap: 15 (ref 5–15)
BUN: 42 mg/dL — ABNORMAL HIGH (ref 6–23)
CHLORIDE: 106 mmol/L (ref 96–112)
CO2: 17 mmol/L — AB (ref 19–32)
Calcium: 8.9 mg/dL (ref 8.4–10.5)
Creatinine, Ser: 1.59 mg/dL — ABNORMAL HIGH (ref 0.50–1.35)
GFR calc Af Amer: 51 mL/min — ABNORMAL LOW (ref >90)
GFR, EST NON AFRICAN AMERICAN: 44 mL/min — AB (ref >90)
Glucose, Bld: 134 mg/dL — ABNORMAL HIGH (ref 70–99)
Potassium: 3.5 mmol/L (ref 3.5–5.1)
Sodium: 138 mmol/L (ref 135–145)

## 2014-09-13 LAB — CBC
HEMATOCRIT: 40.7 % (ref 39.0–52.0)
Hemoglobin: 13.6 g/dL (ref 13.0–17.0)
MCH: 29.3 pg (ref 26.0–34.0)
MCHC: 33.4 g/dL (ref 30.0–36.0)
MCV: 87.7 fL (ref 78.0–100.0)
Platelets: 201 10*3/uL (ref 150–400)
RBC: 4.64 MIL/uL (ref 4.22–5.81)
RDW: 14.5 % (ref 11.5–15.5)
WBC: 13.4 10*3/uL — AB (ref 4.0–10.5)

## 2014-09-13 LAB — GLUCOSE, CAPILLARY
GLUCOSE-CAPILLARY: 181 mg/dL — AB (ref 70–99)
GLUCOSE-CAPILLARY: 194 mg/dL — AB (ref 70–99)
GLUCOSE-CAPILLARY: 261 mg/dL — AB (ref 70–99)
Glucose-Capillary: 167 mg/dL — ABNORMAL HIGH (ref 70–99)

## 2014-09-13 LAB — HEMOGLOBIN A1C
Hgb A1c MFr Bld: 7.3 % — ABNORMAL HIGH (ref 4.8–5.6)
MEAN PLASMA GLUCOSE: 163 mg/dL

## 2014-09-13 LAB — HEPARIN LEVEL (UNFRACTIONATED): Heparin Unfractionated: 0.4 IU/mL (ref 0.30–0.70)

## 2014-09-13 MED ORDER — RESOURCE THICKENUP CLEAR PO POWD
ORAL | Status: DC | PRN
  Filled 2014-09-13 (×2): qty 125

## 2014-09-13 MED ORDER — ISOSORB DINITRATE-HYDRALAZINE 20-37.5 MG PO TABS
0.5000 | ORAL_TABLET | Freq: Three times a day (TID) | ORAL | Status: DC
  Administered 2014-09-13 – 2014-09-14 (×4): 0.5 via ORAL
  Filled 2014-09-13 (×7): qty 0.5

## 2014-09-13 MED ORDER — POTASSIUM CHLORIDE CRYS ER 20 MEQ PO TBCR
40.0000 meq | EXTENDED_RELEASE_TABLET | Freq: Once | ORAL | Status: AC
  Administered 2014-09-13: 40 meq via ORAL
  Filled 2014-09-13: qty 2

## 2014-09-13 MED ORDER — FUROSEMIDE 40 MG PO TABS
40.0000 mg | ORAL_TABLET | Freq: Every day | ORAL | Status: DC
  Administered 2014-09-13 – 2014-09-15 (×3): 40 mg via ORAL
  Filled 2014-09-13 (×4): qty 1

## 2014-09-13 NOTE — Progress Notes (Signed)
Patient ID: [name], male   DOB: 08-Nov-1950, 64 y.o.   MRN: [id number]    SUBJECTIVE: Difficult to communicate due to aphasia.  Seems to deny chest pain and dyspnea.  Appears stable.   Scheduled Meds: . aspirin EC  81 mg Oral Daily  . atorvastatin  80 mg Oral q1800  . calcitRIOL  0.25 mcg Oral Daily  . carvedilol  6.25 mg Oral BID WC  . digoxin  0.125 mg Oral Daily  . furosemide  40 mg Oral Daily  . insulin aspart  0-9 Units Subcutaneous TID WC  . isosorbide-hydrALAZINE  0.5 tablet Oral TID  . potassium chloride  40 mEq Oral Once   Continuous Infusions: . sodium chloride 10 mL/hr at 09/13/14 0800  . heparin 500 Units/hr (09/13/14 0800)   PRN Meds:.acetaminophen **OR** acetaminophen, albuterol, metoprolol, RESOURCE THICKENUP CLEAR    Filed Vitals:   09/13/14 0800 09/13/14 0813 09/13/14 0900 09/13/14 1000  BP: 138/83  138/79 111/78  Pulse:   76 65  Temp:  98.3 F (36.8 C)    TempSrc:  Oral    Resp: Height:      Weight:      SpO2:   100% 98%    Intake/Output Summary (Last 24 hours) at 09/13/14 1048 Last data filed at 09/13/14 1000  Gross per 24 hour  Intake 1121.49 ml  Output   1775 ml  Net -653.51 ml    LABS: Basic Metabolic Panel:  Recent Labs  40/98/11 1938 09/12/14 0215 09/13/14 0117  NA  --  141 138  K  --  4.7 3.5  CL  --  113* 106  CO2  --  17* 17*  GLUCOSE  --  181* 134*  BUN  --  36* 42*  CREATININE  --  1.50* 1.59*  CALCIUM  --  8.8 8.9  MG 1.7  --   --   PHOS 4.0  --   --    Liver Function Tests:  Recent Labs  09/11/14 1938  AST 27  ALT 18  ALKPHOS 81  BILITOT 1.3*  PROT 7.0  ALBUMIN 3.1*   No results for input(s): LIPASE, AMYLASE in the last 72 hours. CBC:  Recent Labs  09/11/14 1329 09/12/14 0215 09/13/14 0117  WBC 12.5* 13.7* 13.4*  NEUTROABS 10.1*  --   --   HGB 15.1 13.9 13.6  HCT 44.6 42.0 40.7  MCV 86.8 89.7 87.7  PLT 214 190 201   Cardiac Enzymes:  Recent Labs  09/11/14 1938 09/11/14 2325  09/12/14 0958  TROPONINI 1.78* 1.66* 0.94*   BNP: Invalid input(s): POCBNP D-Dimer: No results for input(s): DDIMER in the last 72 hours. Hemoglobin A1C:  Recent Labs  09/11/14 1938  HGBA1C 7.3*   Fasting Lipid Panel:  Recent Labs  09/11/14 1938  CHOL 145  HDL 29*  LDLCALC 96  TRIG 98  CHOLHDL 5.0   Thyroid Function Tests:  Recent Labs  09/11/14 1938  TSH 0.252*   Anemia Panel: No results for input(s): VITAMINB12, FOLATE, FERRITIN, TIBC, IRON, RETICCTPCT in the last 72 hours.  RADIOLOGY: Dg Chest 2 View  09/12/2014   CLINICAL DATA:  Acute on chronic congestive heart failure. Atrial fibrillation.  EXAM: CHEST  2 VIEW  COMPARISON:  Single view of the chest 09/11/2014.  FINDINGS: There is cardiomegaly without evidence pulmonary edema. Small to moderate pleural effusions persist, larger on the right. The lungs appear emphysematous.  IMPRESSION: Small to moderate pleural effusions  and basilar atelectasis, worse on the right.  Cardiomegaly without edema.  Emphysema.   Electronically Signed   By: Drusilla Kanner M.D.   On: 09/12/2014 07:38   Dg Chest Portable 1 View  09/11/2014   CLINICAL DATA:  History of CVA, diabetes and previous MI, current hyperglycemia, previous history of tobacco use  EXAM: PORTABLE CHEST - 1 VIEW  COMPARISON:  None.  FINDINGS: Multiple portable films reveal the lungs to be hyperinflated. There are pleural effusions layering posteriorly, bilaterally. The cardiac silhouette is enlarged. The pulmonary vascularity is engorged. The mediastinum is normal in width.  IMPRESSION: COPD with superimposed CHF with mild interstitial edema. There is no alveolar pneumonia.   Electronically Signed   By: David  [place]   On: 09/11/2014 13:24    PHYSICAL EXAM General: NAD Neck: No JVD, no thyromegaly or thyroid nodule.  Lungs: Clear to auscultation bilaterally with normal respiratory effort. CV: Nondisplaced PMI.  Heart irregular S1/S2, no S3/S4, no murmur.  No  peripheral edema.   Abdomen: Soft, nontender, no hepatosplenomegaly, no distention.  Neurologic: Alert and oriented x 3.  Psych: Normal affect. 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  Troponin was noted to be mildly elevated and he was volume overloaded.  1. CAD: Troponin up to 1.7 then trended down.  Possibly some chest pain though communication difficult.  Has a known cardiomyopathy, had not had catheterization in the past given functional status and comorbidities.  He was thought to be a poor candidate for invasive evaluation.   I agree with this.  Possible true NSTEMI but also possible demand ischemia with volume overload and afib/RVR.  No chest pain today.  - As above, would hold off on invasive evaluation.  - Treat medically: continue ASA and statin, heparin until tomorrow then resume Eliquis.  Would not add Plavix as he will need Eliquis.  2. CHF: Acute on chronic systolic CHF.  Cardiomyopathy known from the past, never had ischemic workup.  Strong possibility he has ischemic CMP. EF 20-25% by echo.  He has been diuresed and looks euvolemic, JVP not elevated.  - Continue po Lasix.  - Continue current Coreg - Would stop NTG gtt and start Bidil 1/2 tab tid.  - No ACEI yet with elevated creatinine.  3. CKD: Creatinine stably elevated, follow.  4. Atrial fibrillation: Chronic, rate-controlled on Coreg.  As above, stop heparin gtt and restart apixaban tomorrow.   Marca Ancona 09/13/2014 10:58 AM

## 2014-09-13 NOTE — Progress Notes (Addendum)
Subjective:  Pt seen and examined in AM. No acute events overnight. His heart rate and blood pressure have improved. Her is tolerating PO intake. He is non-verbal with no family at bedside.     Objective: Vital signs in last 24 hours: Filed Vitals:   09/13/14 0800 09/13/14 0813 09/13/14 0900 09/13/14 1000  BP: 138/83  138/79 111/78  Pulse:   76 65  Temp:  98.3 F (36.8 C)    TempSrc:  Oral    Resp: Height:      Weight:      SpO2:   100% 98%   Weight change:   Intake/Output Summary (Last 24 hours) at 09/13/14 1126 Last data filed at 09/13/14 1000  Gross per 24 hour  Intake 1112.58 ml  Output   1775 ml  Net -662.42 ml   PHYSICAL EXAMINATION:   General: NAD HEENT: Bilateral exophthalmos Heart: Irregularly irregular rhythm with normal heart rate.  Lungs: Anterior lung sounds clear.  Abdomen: Soft, non-distended, with normal BS Extremities: Left BKA and right AKA Neuro: Right sided flaccidity, no CN deficit    Lab Results: Basic Metabolic Panel:  Recent Labs Lab 09/11/14 1938 09/12/14 0215 09/13/14 0117  NA  --  141 138  K  --  4.7 3.5  CL  --  113* 106  CO2  --  17* 17*  GLUCOSE  --  181* 134*  BUN  --  36* 42*  CREATININE  --  1.50* 1.59*  CALCIUM  --  8.8 8.9  MG 1.7  --   --   PHOS 4.0  --   --    Liver Function Tests:  Recent Labs Lab 09/11/14 1938  AST 27  ALT 18  ALKPHOS 81  BILITOT 1.3*  PROT 7.0  ALBUMIN 3.1*   CBC:  Recent Labs Lab 09/11/14 1329 09/12/14 0215 09/13/14 0117  WBC 12.5* 13.7* 13.4*  NEUTROABS 10.1*  --   --   HGB 15.1 13.9 13.6  HCT 44.6 42.0 40.7  MCV 86.8 89.7 87.7  PLT 214 190 201   Cardiac Enzymes:  Recent Labs Lab 09/11/14 1938 09/11/14 2325 09/12/14 0958  TROPONINI 1.78* 1.66* 0.94*   CBG:  Recent Labs Lab 09/11/14 2129 09/12/14 0745 09/12/14 1128 09/12/14 1628 09/12/14 2150 09/13/14 0811  GLUCAP 199* 180* 221* 277* 112* 181*   Hemoglobin A1C:  Recent Labs Lab  09/11/14 1938  HGBA1C 7.3*   Fasting Lipid Panel:  Recent Labs Lab 09/11/14 1938  CHOL 145  HDL 29*  LDLCALC 96  TRIG 98  CHOLHDL 5.0   Thyroid Function Tests:  Recent Labs Lab 09/11/14 1938 09/12/14 0215  TSH 0.252*  --   FREET4  --  1.84*   Urinalysis:  Recent Labs Lab 09/11/14 1406  COLORURINE AMBER*  LABSPEC 1.028  PHURINE 5.0  GLUCOSEU 100*  HGBUR MODERATE*  BILIRUBINUR SMALL*  KETONESUR 15*  PROTEINUR >300*  UROBILINOGEN 1.0  NITRITE NEGATIVE  LEUKOCYTESUR NEGATIVE    Micro Results: Recent Results (from the past 240 hour(s))  Urine culture     Status: None   Collection Time: 09/11/14  2:06 PM  Result Value Ref Range Status   Specimen Description URINE, CATHETERIZED  Final   Special Requests NONE  Final   Colony Count NO GROWTH Performed at [facility]   Final   Culture NO GROWTH Performed at [facility]   Final   Report Status 09/12/2014 FINAL  Final  MRSA PCR Screening     Status: None   Collection Time: 09/11/14  4:56 PM  Result Value Ref Range Status   MRSA by PCR NEGATIVE NEGATIVE Final    Comment:        The GeneXpert MRSA Assay (FDA approved for NASAL specimens only), is one component of a comprehensive MRSA colonization surveillance program. It is not intended to diagnose MRSA infection nor to guide or monitor treatment for MRSA infections.    Studies/Results: Dg Chest 2 View  09/12/2014   CLINICAL DATA:  Acute on chronic congestive heart failure. Atrial fibrillation.  EXAM: CHEST  2 VIEW  COMPARISON:  Single view of the chest 09/11/2014.  FINDINGS: There is cardiomegaly without evidence pulmonary edema. Small to moderate pleural effusions persist, larger on the right. The lungs appear emphysematous.  IMPRESSION: Small to moderate pleural effusions and basilar atelectasis, worse on the right.  Cardiomegaly without edema.  Emphysema.   Electronically Signed   By: Drusilla Kanner M.D.   On: 09/12/2014 07:38   Dg  Chest Portable 1 View  09/11/2014   CLINICAL DATA:  History of CVA, diabetes and previous MI, current hyperglycemia, previous history of tobacco use  EXAM: PORTABLE CHEST - 1 VIEW  COMPARISON:  None.  FINDINGS: Multiple portable films reveal the lungs to be hyperinflated. There are pleural effusions layering posteriorly, bilaterally. The cardiac silhouette is enlarged. The pulmonary vascularity is engorged. The mediastinum is normal in width.  IMPRESSION: COPD with superimposed CHF with mild interstitial edema. There is no alveolar pneumonia.   Electronically Signed   By: David  [place]   On: 09/11/2014 13:24   Medications: I have reviewed the patient's current medications. Scheduled Meds: . aspirin EC  81 mg Oral Daily  . atorvastatin  80 mg Oral q1800  . calcitRIOL  0.25 mcg Oral Daily  . carvedilol  6.25 mg Oral BID WC  . digoxin  0.125 mg Oral Daily  . furosemide  40 mg Oral Daily  . insulin aspart  0-9 Units Subcutaneous TID WC  . isosorbide-hydrALAZINE  0.5 tablet Oral TID  . potassium chloride  40 mEq Oral Once   Continuous Infusions: . sodium chloride 10 mL/hr at 09/13/14 0800  . heparin 500 Units/hr (09/13/14 0800)   PRN Meds:.acetaminophen **OR** acetaminophen, albuterol, metoprolol, RESOURCE THICKENUP CLEAR Assessment/Plan: Principal Problem:   Atrial fibrillation with rapid ventricular response Active Problems:   Hypertension   COPD (chronic obstructive pulmonary disease)   T2DM (type 2 diabetes mellitus)   PAD (peripheral artery disease)   Hemiparesis affecting right side as late effect of cerebrovascular accident   Amputee of extremity   Aphasia as late effect of cerebrovascular accident   AKI (acute kidney injury)   Cardiomyopathy, ischemic   Acute on chronic combined systolic and diastolic heart failure - secondary to accelerated hypertension and A. fib RVR   Accelerated hypertension with diastolic congestive heart failure, NYHA class 3   Non-STEMI (non-ST elevated  myocardial infarction)   Atrial Fibrillation with RVR - Pt in permanent afib with normal. Pt was at home on lopressor for rate control and eliquis for [facility] therapy. Unclear trigger, possibly due to hyperthyroidism vs AECHF vs ACS vs uncontrolled hypertension.  -Continue carvedilol 6.25 mg BID -Continue heparin drip per pharmacy until tomm, then start home eliquis   -Consider starting methimazole 5 mg TID for hyperthyroidism   Primary Hyperthyroidism - TSH 0.252 with free T4 1.84. Pt with permanent afib with RVR on admission.  -Consider starting methimazole  5 mg TID  -Follow-up T3 level and TSI level, and anti-TPO   Acute on Chronic Systolic CHF in setting of Ischemic Cardiomyopathy - Pt with last 2D-echo September 2013 with EF 25-30%, now reduced to 20-25%. BNP elevated at 2342. Chest xray with bilateral pleural effusions. -Appreciate cardiology consult -Transition IV lasix 40 mg BID to PO lasix 40 mg daily as pt is euvolemic  -Continue carvedilol 6.25 mg BID  -Continue digoxin 0.125 mg daily and monitor levels  -Start bidil 0.5 mg TID  -Hold home lisinopril 40 mg daily in setting of AKI -Pt not candidate for ICD  Elevated troponin in setting of probable CAD - Troponin peaked at 1.78. Etiology likely demand ischemia in setting of Afib with RVR vs NSTEMI. -Appreciate cardiology recommendations  -Continue IV heparin per pharmacy until tomm, then transition to home eliquis -Discontinue nitro drip per cardiology and start bidil 0.5 mg TID   -Continue aspirin 81 mg daily, atorvastatin 80 mg daily, and carvedilol 6.25 mg BID  -Hold home lisinopril 40 mg daily in setting of AKI  -Pt is not candidate for invasive cardiac testing  Non-oliguric AKI - Cr up to 1.59 from 1.50  Yesterday with last Cr 0.92 of May 2015. UA with proteinuria (3.1 mg). Etiology possibly cardiorenal syndrome in setting of reduced systolic EF.  -Monitor daily weights and strict I & O's  -Monitor BMP  -Hold home  lisinopril 40 mg daily in setting of AKI  Hypertension - Currently normotensive. Pt was at home on lopressor 25 mg BID.  -Discontinue nitro drip per cardiology and start bidil 0.5 mg TID   -Continue carvedilol 6.25 mg BID  -Hold home lisinopril 40 mg daily in setting of AKI  Insulin-dependent Type 2 DM - CBG 181 this AM. Last A1c 7.3 on 09/11/14. Pt at home on Lantus 0-5 U daily and SSI before meals. -Hold home glipizide 5 mg BID and Lantus  -Sensitive SSI with meals -Monitor glucose before meals and at bedtime   History of CVA with right sided hemiparesis and expressive aphasia - Currently at baseline neurological status. -Continue home aspirin 81 mg daily   COPD - No current exacerbation. Pt not on home inhalers. No PFT's on file. -Albuterol nebulizer Q 4 Hr PRN  Hyperlipidemia -Lipid panel with LDL 96. Pt at home on pravastatin daily.  -Continue atorvastatin 80 mg daily    History of Hepatitis C infection -Per pt's sister had hepatitis C infection in past.  -Awaiting HCV VL -HIV Ab negative  Diet: Heart DVT Ppx: IV heparin  Code: DNR (confirmed by sister and form in chart)    Dispo: Disposition is deferred at this time, awaiting improvement of current medical problems.  Anticipated discharge in approximately 2-3 day(s).   The patient does have a current PCP (No primary care provider on file.) and does need an [facility] hospital follow-up appointment after discharge.  The patient does have transportation limitations that hinder transportation to clinic appointments.  .Services Needed at time of discharge: Y = Yes, Blank = No PT:   OT:   RN:   Equipment:   Other:     LOS: 2 days   Otis Brace, MD 09/13/2014, 11:26 AM

## 2014-09-13 NOTE — Progress Notes (Signed)
Internal Medicine Attending  Date: 09/13/2014  Patient name: [name] Medical record number: [id number] Date of birth: [birth year] Age: 64 y.o. Gender: male  I saw and evaluated the patient, and discussed his care with resident.  I reviewed the resident's note by Dr. Johna Roles and I agree with the resident's findings and plans as documented in her note.

## 2014-09-13 NOTE — Progress Notes (Signed)
ANTICOAGULATION CONSULT NOTE - Follow Up Consult  Pharmacy Consult for heparin Indication: chest pain/ACS   Labs:  Recent Labs  09/11/14 1329 09/11/14 1938 09/11/14 2325 09/12/14 0215 09/12/14 0958 09/12/14 1550 09/12/14 1600 09/13/14 0117  HGB 15.1  --   --  13.9  --   --   --  13.6  HCT 44.6  --   --  42.0  --   --   --  40.7  PLT 214  --   --  190  --   --   --  201  APTT  --   --   --   --   --  40*  --   --   HEPARINUNFRC  --   --   --   --   --   --  0.64 0.40  CREATININE 1.62*  --   --  1.50*  --   --   --  1.59*  TROPONINI  --  1.78* 1.66*  --  0.94*  --   --   --      Assessment/Plan:  64yo male remains therapeutic on heparin though is trending down. Will continue gtt at current rate and confirm stable with am labs.   Vernard Gambles, PharmD, BCPS  09/13/2014,2:43 AM

## 2014-09-14 DIAGNOSIS — I5021 Acute systolic (congestive) heart failure: Secondary | ICD-10-CM | POA: Insufficient documentation

## 2014-09-14 LAB — GLUCOSE, CAPILLARY
GLUCOSE-CAPILLARY: 187 mg/dL — AB (ref 70–99)
GLUCOSE-CAPILLARY: 221 mg/dL — AB (ref 70–99)
Glucose-Capillary: 191 mg/dL — ABNORMAL HIGH (ref 70–99)
Glucose-Capillary: 157 mg/dL — ABNORMAL HIGH (ref 70–99)

## 2014-09-14 LAB — CBC
HEMATOCRIT: 39 % (ref 39.0–52.0)
Hemoglobin: 13 g/dL (ref 13.0–17.0)
MCH: 29.1 pg (ref 26.0–34.0)
MCHC: 33.3 g/dL (ref 30.0–36.0)
MCV: 87.4 fL (ref 78.0–100.0)
Platelets: 221 10*3/uL (ref 150–400)
RBC: 4.46 MIL/uL (ref 4.22–5.81)
RDW: 14.2 % (ref 11.5–15.5)
WBC: 12.1 10*3/uL — AB (ref 4.0–10.5)

## 2014-09-14 LAB — BASIC METABOLIC PANEL
ANION GAP: 16 — AB (ref 5–15)
BUN: 33 mg/dL — ABNORMAL HIGH (ref 6–23)
CHLORIDE: 105 mmol/L (ref 96–112)
CO2: 17 mmol/L — ABNORMAL LOW (ref 19–32)
Calcium: 9 mg/dL (ref 8.4–10.5)
Creatinine, Ser: 1.41 mg/dL — ABNORMAL HIGH (ref 0.50–1.35)
GFR, EST AFRICAN AMERICAN: 59 mL/min — AB (ref >90)
GFR, EST NON AFRICAN AMERICAN: 51 mL/min — AB (ref >90)
Glucose, Bld: 188 mg/dL — ABNORMAL HIGH (ref 70–99)
Potassium: 4 mmol/L (ref 3.5–5.1)
SODIUM: 138 mmol/L (ref 135–145)

## 2014-09-14 LAB — THYROID PEROXIDASE ANTIBODY: THYROID PEROXIDASE ANTIBODY: 14 [IU]/mL (ref 0–34)

## 2014-09-14 LAB — DIGOXIN LEVEL: DIGOXIN LVL: 1.2 ng/mL (ref 0.8–2.0)

## 2014-09-14 LAB — HEPARIN LEVEL (UNFRACTIONATED): Heparin Unfractionated: 0.15 IU/mL — ABNORMAL LOW (ref 0.30–0.70)

## 2014-09-14 LAB — T3, FREE: T3, Free: 2.5 pg/mL (ref 2.0–4.4)

## 2014-09-14 MED ORDER — ISOSORB DINITRATE-HYDRALAZINE 20-37.5 MG PO TABS
1.0000 | ORAL_TABLET | Freq: Three times a day (TID) | ORAL | Status: DC
  Administered 2014-09-14 – 2014-09-17 (×8): 1 via ORAL
  Filled 2014-09-14 (×11): qty 1

## 2014-09-14 MED ORDER — DIGOXIN 0.0625 MG HALF TABLET
0.0625 mg | ORAL_TABLET | Freq: Every day | ORAL | Status: DC
  Administered 2014-09-15 – 2014-09-17 (×3): 0.0625 mg via ORAL
  Filled 2014-09-14 (×3): qty 1

## 2014-09-14 MED ORDER — APIXABAN 2.5 MG PO TABS
2.5000 mg | ORAL_TABLET | Freq: Two times a day (BID) | ORAL | Status: DC
  Administered 2014-09-14 – 2014-09-17 (×7): 2.5 mg via ORAL
  Filled 2014-09-14 (×8): qty 1

## 2014-09-14 MED ORDER — METOPROLOL TARTRATE 1 MG/ML IV SOLN
2.5000 mg | Freq: Once | INTRAVENOUS | Status: AC
  Administered 2014-09-14: 2.5 mg via INTRAVENOUS

## 2014-09-14 MED ORDER — CARVEDILOL 12.5 MG PO TABS
12.5000 mg | ORAL_TABLET | Freq: Two times a day (BID) | ORAL | Status: DC
Start: 2014-09-14 — End: 2014-09-17
  Administered 2014-09-14 – 2014-09-17 (×6): 12.5 mg via ORAL
  Filled 2014-09-14 (×8): qty 1

## 2014-09-14 MED ORDER — HEPARIN BOLUS VIA INFUSION
1000.0000 [IU] | Freq: Once | INTRAVENOUS | Status: AC
  Administered 2014-09-14: 1000 [IU] via INTRAVENOUS
  Filled 2014-09-14: qty 1000

## 2014-09-14 NOTE — Progress Notes (Signed)
ANTICOAGULATION CONSULT NOTE - Initial Consult  Pharmacy Consult for apixaban  Indication: atrial fibrillation  No Known Allergies  Patient Measurements: Height: 5\' 6"  (167.6 cm) Weight: 97 lb (44 kg) IBW/kg (Calculated) : 63.8  Vital Signs: Temp: 97.7 F (36.5 C) (04/10 0735) Temp Source: Oral (04/10 0735) BP: 161/98 mmHg (04/10 0800) Pulse Rate: 76 (04/10 0735)  Labs:  Recent Labs  09/11/14 1938 09/11/14 2325 09/12/14 0215 09/12/14 0958 09/12/14 1550 09/12/14 1600 09/13/14 0117 09/14/14 0245  HGB  --   --  13.9  --   --   --  13.6 13.0  HCT  --   --  42.0  --   --   --  40.7 39.0  PLT  --   --  190  --   --   --  201 221  APTT  --   --   --   --  40*  --   --   --   HEPARINUNFRC  --   --   --   --   --  0.64 0.40 0.15*  CREATININE  --   --  1.50*  --   --   --  1.59* 1.41*  TROPONINI 1.78* 1.66*  --  0.94*  --   --   --   --     Estimated Creatinine Clearance: 32.9 mL/min (by C-G formula based on Cr of 1.41).   Medical History: Past Medical History  Diagnosis Date  . Diabetes mellitus without complication   . Stroke   . MI (myocardial infarction)   . Hypertension     Assessment: 64 year old is a past medical history of ischemic cardiomyopathy with chronic systolic CHF (EF of 25-30%) on ECHO in 2013, insulin-dependent type 2 DM, PAD s/p left BKA and right AKA, COPD, atrial fibrillation, hypertension, hyperlipidemia, NSTEMI, CVA with right sided hemiparesis and expressive aphasia. Hgb and plt stable.  Pharmacy consulted to begin apixaban for afib. Patient borderline for dose adjustment (reduce to 2.5mg  if age >43, wt <60kg, or SCr >1.5) - SCr consistently >1.5 for past 3 days, today dipped below at 1.41. Will start dosing conservatively based on higher SCr trend and monitor for any consistent improvement in SCr for potential dose increase to 5mg  BID.  CHA2DS2-VASc Score = 6 (CHF, HTN, DM, MI, CVA) Unadjusted Ischemic Stroke Rate (% per year) = 9.7%   Goal  of Therapy:  Appropriate anticoagulation Monitor platelets by anticoagulation protocol: Yes   Plan:  Apixaban 2.5mg  po BID Will monitor renal function for any necessary dose adjustments Patient's caregiver has been educated  [place] E. Marsela Kuan, Pharm.D Clinical Pharmacy Resident Pager: [phone] 09/14/2014 11:30 AM

## 2014-09-14 NOTE — Progress Notes (Signed)
Internal Medicine Attending  Date: 09/14/2014  Patient name: [name] Medical record number: [id number] Date of birth: 1950-08-06 Age: 64 y.o. Gender: male  I saw and evaluated the patient, and discussed his care with resident on A.M rounds.  I reviewed the resident's note by Dr. Johna Roles and I agree with the resident's findings and plans as documented in her note, with the following additional comments.  Would discuss methimazole dosing and timing of thyroid scan/uptake with endocrinology tomorrow.

## 2014-09-14 NOTE — Progress Notes (Signed)
ANTICOAGULATION CONSULT NOTE - Follow Up Consult  Pharmacy Consult for heparin Indication: chest pain/ACS and atrial fibrillation  Labs:  Recent Labs  09/11/14 1329 09/11/14 1938 09/11/14 2325 09/12/14 0215 09/12/14 0958 09/12/14 1550 09/12/14 1600 09/13/14 0117 09/14/14 0245  HGB 15.1  --   --  13.9  --   --   --  13.6 13.0  HCT 44.6  --   --  42.0  --   --   --  40.7 39.0  PLT 214  --   --  190  --   --   --  201 221  APTT  --   --   --   --   --  40*  --   --   --   HEPARINUNFRC  --   --   --   --   --   --  0.64 0.40 0.15*  CREATININE 1.62*  --   --  1.50*  --   --   --  1.59*  --   TROPONINI  --  1.78* 1.66*  --  0.94*  --   --   --   --      Assessment: 64yo male subtherapeutic on heparin after two levels at goal though had been trending down quickly and was likely still being affected by apixaban.  Goal of Therapy:  Heparin level 0.3-0.7 units/ml   Plan:  Will give small heparin bolus of 1000 units and increase heparin gtt by 3 units/kg/hr to 650 units/hr and check level in 8hr.  Vernard Gambles, PharmD, BCPS  09/14/2014,3:57 AM

## 2014-09-14 NOTE — Progress Notes (Addendum)
Subjective:  Pt seen and examined in AM. His blood pressure was elevated overnight. He is non-verbal with no family at bedside.     Objective: Vital signs in last 24 hours: Filed Vitals:   09/14/14 0701 09/14/14 0735 09/14/14 0800 09/14/14 1151  BP: 161/98  161/98 136/63  Pulse:  76  77  Temp:  97.7 F (36.5 C)  97.7 F (36.5 C)  TempSrc:  Oral  Axillary  Resp:  11  33  Height:      Weight:      SpO2:  99%  98%   Weight change:   Intake/Output Summary (Last 24 hours) at 09/14/14 1353 Last data filed at 09/14/14 1200  Gross per 24 hour  Intake  654.6 ml  Output   1000 ml  Net -345.4 ml   PHYSICAL EXAMINATION:   General: NAD HEENT: Bilateral exophthalmos Heart: Irregularly irregular rhythm with normal heart rate.  Lungs: Anterior lung sounds clear.  Abdomen: Soft, non-distended, with normal BS Extremities: Left BKA and right AKA Neuro: Right sided flaccidity, no CN deficit    Lab Results: Basic Metabolic Panel:  Recent Labs Lab 09/11/14 1938  09/13/14 0117 09/14/14 0245  NA  --   < > 138 138  K  --   < > 3.5 4.0  CL  --   < > 106 105  CO2  --   < > 17* 17*  GLUCOSE  --   < > 134* 188*  BUN  --   < > 42* 33*  CREATININE  --   < > 1.59* 1.41*  CALCIUM  --   < > 8.9 9.0  MG 1.7  --   --   --   PHOS 4.0  --   --   --   < > = values in this interval not displayed. Liver Function Tests:  Recent Labs Lab 09/11/14 1938  AST 27  ALT 18  ALKPHOS 81  BILITOT 1.3*  PROT 7.0  ALBUMIN 3.1*   CBC:  Recent Labs Lab 09/11/14 1329  09/13/14 0117 09/14/14 0245  WBC 12.5*  < > 13.4* 12.1*  NEUTROABS 10.1*  --   --   --   HGB 15.1  < > 13.6 13.0  HCT 44.6  < > 40.7 39.0  MCV 86.8  < > 87.7 87.4  PLT 214  < > 201 221  < > = values in this interval not displayed. Cardiac Enzymes:  Recent Labs Lab 09/11/14 1938 09/11/14 2325 09/12/14 0958  TROPONINI 1.78* 1.66* 0.94*   CBG:  Recent Labs Lab 09/13/14 0811 09/13/14 1221 09/13/14 1627  09/13/14 2151 09/14/14 0738 09/14/14 1156  GLUCAP 181* 194* 261* 167* 187* 221*   Hemoglobin A1C:  Recent Labs Lab 09/11/14 1938  HGBA1C 7.3*   Fasting Lipid Panel:  Recent Labs Lab 09/11/14 1938  CHOL 145  HDL 29*  LDLCALC 96  TRIG 98  CHOLHDL 5.0   Thyroid Function Tests:  Recent Labs Lab 09/11/14 1938 09/12/14 0215 09/13/14 0117  TSH 0.252*  --   --   FREET4  --  1.84*  --   T3FREE  --   --  2.5   Urinalysis:  Recent Labs Lab 09/11/14 1406  COLORURINE AMBER*  LABSPEC 1.028  PHURINE 5.0  GLUCOSEU 100*  HGBUR MODERATE*  BILIRUBINUR SMALL*  KETONESUR 15*  PROTEINUR >300*  UROBILINOGEN 1.0  NITRITE NEGATIVE  LEUKOCYTESUR NEGATIVE    Micro Results: Recent Results (from the past  240 hour(s))  Urine culture     Status: None   Collection Time: 09/11/14  2:06 PM  Result Value Ref Range Status   Specimen Description URINE, CATHETERIZED  Final   Special Requests NONE  Final   Colony Count NO GROWTH Performed at [facility]   Final   Culture NO GROWTH Performed at [facility]   Final   Report Status 09/12/2014 FINAL  Final  MRSA PCR Screening     Status: None   Collection Time: 09/11/14  4:56 PM  Result Value Ref Range Status   MRSA by PCR NEGATIVE NEGATIVE Final    Comment:        The GeneXpert MRSA Assay (FDA approved for NASAL specimens only), is one component of a comprehensive MRSA colonization surveillance program. It is not intended to diagnose MRSA infection nor to guide or monitor treatment for MRSA infections.    Studies/Results: No results found. Medications: I have reviewed the patient's current medications. Scheduled Meds: . apixaban  2.5 mg Oral BID  . aspirin EC  81 mg Oral Daily  . atorvastatin  80 mg Oral q1800  . calcitRIOL  0.25 mcg Oral Daily  . carvedilol  12.5 mg Oral BID WC  . [START ON 09/15/2014] digoxin  0.0625 mg Oral Daily  . furosemide  40 mg Oral Daily  . insulin aspart  0-9 Units  Subcutaneous TID WC  . isosorbide-hydrALAZINE  1 tablet Oral TID   Continuous Infusions: . sodium chloride 10 mL/hr (09/14/14 0700)   PRN Meds:.acetaminophen **OR** acetaminophen, albuterol, metoprolol, RESOURCE THICKENUP CLEAR Assessment/Plan: Principal Problem:   Atrial fibrillation with rapid ventricular response Active Problems:   Hypertension   COPD (chronic obstructive pulmonary disease)   T2DM (type 2 diabetes mellitus)   PAD (peripheral artery disease)   Hemiparesis affecting right side as late effect of cerebrovascular accident   Amputee of extremity   Aphasia as late effect of cerebrovascular accident   AKI (acute kidney injury)   Cardiomyopathy, ischemic   Acute on chronic combined systolic and diastolic heart failure - secondary to accelerated hypertension and A. fib RVR   Accelerated hypertension with diastolic congestive heart failure, NYHA class 3   Non-STEMI (non-ST elevated myocardial infarction)  Hypertension - Currently normotensive but was hypertensive to 160-170's overnight. Pt was at home on lopressor 25 mg BID.  -Increase bidil 20-37.5 mg to TID   -Per cardiology increase carvedilol 6.25 mg BID to 12.5 mg BID  -Hold home lisinopril 40 mg daily in setting of AKI  Atrial Fibrillation with RVR - Pt in permanent afib with normal heart rate. Pt was at home on lopressor for rate control and eliquis for [facility] therapy. Unclear trigger, possibly due to hyperthyroidism vs AECHF vs ACS vs uncontrolled hypertension.  -Per cardiology increase carvedilol 6.25 mg BID to 12.5 mg BID  -Heparin drip discontinued and home eliquis restarted   -Consider starting methimazole 5 mg TID for hyperthyroidism  Primary Hyperthyroidism - TSH 0.252 with free T4 1.84. Free T3 normal and negative anti-TPO. Pt with evidence of Graves disease. Pt with permanent afib with RVR on admission. -Consider starting methimazole 5 mg TID  -Follow-up TSI level   Acute on Chronic Systolic CHF in setting  of Ischemic Cardiomyopathy - Pt with last 2D-echo September 2013 with EF 25-30%, now reduced to 20-25%. BNP elevated at 2342. Chest xray with bilateral pleural effusions. -Appreciate cardiology consult -Continue PO lasix 40 mg daily as pt is euvolemic  -Increase carvedilol 6.25  mg BID to 12.5 mg BID  -Decrease digoxin 0.125 mg daily to 0.065mg  daily -Increase bidil 20-37.5 mg to TID      -Hold home lisinopril 40 mg daily in setting of AKI -Pt not candidate for ICD  Elevated troponin in setting of probable CAD - Troponin peaked at 1.78. Etiology likely demand ischemia in setting of Afib with RVR vs NSTEMI. -Appreciate cardiology recommendations  -Discontinue IV hepatin and start home eliquis -Increase bidil 20-37.5 mg to TID  -Continue aspirin 81 mg daily, atorvastatin 80 mg daily, and increase carvedilol 6.25 mg BID to 12.5 mg BID  -Hold home lisinopril 40 mg daily in setting of AKI  -Pt is not candidate for invasive cardiac testing  Non-oliguric AKI - Cr down to 1.41 from 1.59. Last Cr 0.92 of May 2015. UA with proteinuria (3.1 mg). Etiology possibly cardiorenal syndrome in setting of reduced systolic EF.  -Monitor daily weights and strict I & O's  -Monitor BMP  -Hold home lisinopril 40 mg daily in setting of AKI  Insulin-dependent Type 2 DM - CBG 221 this AM. Last A1c 7.3 on 09/11/14. Pt at home on Lantus 0-5 U daily and SSI before meals. -Hold home glipizide 5 mg BID and Lantus  -Sensitive SSI with meals -Monitor glucose before meals and at bedtime   History of CVA with right sided hemiparesis and expressive aphasia - Currently at baseline neurological status. -Continue home aspirin 81 mg daily   COPD - No current exacerbation. Pt not on home inhalers. No PFT's on file. -Albuterol nebulizer Q 4 Hr PRN  Hyperlipidemia -Lipid panel with LDL 96. Pt at home on pravastatin daily.  -Continue atorvastatin 80 mg daily    History of Hepatitis C infection -Per pt's sister had  hepatitis C infection in past.  -Awaiting HCV VL -HIV Ab negative  Diet: Heart DVT Ppx: Eliquis  Code: DNR (confirmed by sister and form in chart)    Dispo: Disposition is deferred at this time, awaiting improvement of current medical problems.  Anticipated discharge in approximately 2-3 day(s).   The patient does have a current PCP (No primary care provider on file.) and does need an [facility] hospital follow-up appointment after discharge.  The patient does have transportation limitations that hinder transportation to clinic appointments.  .Services Needed at time of discharge: Y = Yes, Blank = No PT:   OT:   RN:   Equipment:   Other:     LOS: 3 days   Otis Brace, MD 09/14/2014, 1:53 PM

## 2014-09-14 NOTE — Progress Notes (Signed)
Patient ID: [name], male   DOB: 1951/03/04, 64 y.o.   MRN: [id number]    SUBJECTIVE: Difficult to communicate due to aphasia.  Does not like his position in the bed.  Seems to deny chest pain and dyspnea.  Appears stable. BP high.   Scheduled Meds: . aspirin EC  81 mg Oral Daily  . atorvastatin  80 mg Oral q1800  . calcitRIOL  0.25 mcg Oral Daily  . carvedilol  12.5 mg Oral BID WC  . [START ON 09/15/2014] digoxin  0.0625 mg Oral Daily  . furosemide  40 mg Oral Daily  . insulin aspart  0-9 Units Subcutaneous TID WC  . isosorbide-hydrALAZINE  1 tablet Oral TID  . metoprolol  2.5 mg Intravenous Once   Continuous Infusions: . sodium chloride 10 mL/hr (09/14/14 0700)   PRN Meds:.acetaminophen **OR** acetaminophen, albuterol, metoprolol, RESOURCE THICKENUP CLEAR    Filed Vitals:   09/14/14 0400 09/14/14 0701 09/14/14 0735 09/14/14 0800  BP: 153/98 161/98  161/98  Pulse: 68  76   Temp: 97.5 F (36.4 C)  97.7 F (36.5 C)   TempSrc: Oral  Oral   Resp: 13  11   Height:      Weight:      SpO2: 99%  99%     Intake/Output Summary (Last 24 hours) at 09/14/14 1045 Last data filed at 09/14/14 0900  Gross per 24 hour  Intake  589.6 ml  Output   1000 ml  Net -410.4 ml    LABS: Basic Metabolic Panel:  Recent Labs  47/82/95 1938  09/13/14 0117 09/14/14 0245  NA  --   < > 138 138  K  --   < > 3.5 4.0  CL  --   < > 106 105  CO2  --   < > 17* 17*  GLUCOSE  --   < > 134* 188*  BUN  --   < > 42* 33*  CREATININE  --   < > 1.59* 1.41*  CALCIUM  --   < > 8.9 9.0  MG 1.7  --   --   --   PHOS 4.0  --   --   --   < > = values in this interval not displayed. Liver Function Tests:  Recent Labs  09/11/14 1938  AST 27  ALT 18  ALKPHOS 81  BILITOT 1.3*  PROT 7.0  ALBUMIN 3.1*   No results for input(s): LIPASE, AMYLASE in the last 72 hours. CBC:  Recent Labs  09/11/14 1329  09/13/14 0117 09/14/14 0245  WBC 12.5*  < > 13.4* 12.1*  NEUTROABS 10.1*  --   --   --     HGB 15.1  < > 13.6 13.0  HCT 44.6  < > 40.7 39.0  MCV 86.8  < > 87.7 87.4  PLT 214  < > 201 221  < > = values in this interval not displayed. Cardiac Enzymes:  Recent Labs  09/11/14 1938 09/11/14 2325 09/12/14 0958  TROPONINI 1.78* 1.66* 0.94*   BNP: Invalid input(s): POCBNP D-Dimer: No results for input(s): DDIMER in the last 72 hours. Hemoglobin A1C:  Recent Labs  09/11/14 1938  HGBA1C 7.3*   Fasting Lipid Panel:  Recent Labs  09/11/14 1938  CHOL 145  HDL 29*  LDLCALC 96  TRIG 98  CHOLHDL 5.0   Thyroid Function Tests:  Recent Labs  09/11/14 1938 09/13/14 0117  TSH 0.252*  --   T3FREE  --  2.5   Anemia Panel: No results for input(s): VITAMINB12, FOLATE, FERRITIN, TIBC, IRON, RETICCTPCT in the last 72 hours.  RADIOLOGY: Dg Chest 2 View  09/12/2014   CLINICAL DATA:  Acute on chronic congestive heart failure. Atrial fibrillation.  EXAM: CHEST  2 VIEW  COMPARISON:  Single view of the chest 09/11/2014.  FINDINGS: There is cardiomegaly without evidence pulmonary edema. Small to moderate pleural effusions persist, larger on the right. The lungs appear emphysematous.  IMPRESSION: Small to moderate pleural effusions and basilar atelectasis, worse on the right.  Cardiomegaly without edema.  Emphysema.   Electronically Signed   By: Drusilla Kanner M.D.   On: 09/12/2014 07:38   Dg Chest Portable 1 View  09/11/2014   CLINICAL DATA:  History of CVA, diabetes and previous MI, current hyperglycemia, previous history of tobacco use  EXAM: PORTABLE CHEST - 1 VIEW  COMPARISON:  None.  FINDINGS: Multiple portable films reveal the lungs to be hyperinflated. There are pleural effusions layering posteriorly, bilaterally. The cardiac silhouette is enlarged. The pulmonary vascularity is engorged. The mediastinum is normal in width.  IMPRESSION: COPD with superimposed CHF with mild interstitial edema. There is no alveolar pneumonia.   Electronically Signed   By: David  [place]   On:  09/11/2014 13:24    PHYSICAL EXAM General: NAD Neck: No JVD, no thyromegaly or thyroid nodule.  Lungs: Clear to auscultation bilaterally with normal respiratory effort. CV: Nondisplaced PMI.  Heart irregular S1/S2, no S3/S4, no murmur.  No peripheral edema.   Abdomen: Soft, nontender, no hepatosplenomegaly, no distention.  Neurologic: Alert and oriented x 3.  Psych: Normal affect. Extremities: s/p BKA & AKA  TELEMETRY: Reviewed telemetry pt in atrial fibrillation, rate 80s-90s  ASSESSMENT AND PLAN: 64 yo with history of CVA with chronic right hemiparesis and aphasia, CKD, PAD, COPD, chronic atrial fibrillation, known cardiomyopathy of uncertain etiology was admitted with dyspnea.  Troponin was noted to be mildly elevated and he was volume overloaded.  1. CAD: Troponin up to 1.7 then trended down.  Does not seem to be having chest pain now.  Need to have family member come in to help with communication.  Has a known cardiomyopathy, had not had catheterization in the past given functional status and comorbidities.  He was thought to be a poor candidate for invasive evaluation.   I agree with this.  Possible true NSTEMI but also possible demand ischemia with volume overload and afib/RVR.   - As above, would hold off on invasive evaluation.  - Treat medically: continue ASA and statin, stop heparin today and resume Eliquis.  Would not add Plavix as he will need Eliquis.  2. CHF: Acute on chronic systolic CHF.  Cardiomyopathy known from the past, never had ischemic workup.  Strong possibility he has ischemic CMP. EF 20-25% by echo.  He has been diuresed and looks euvolemic, JVP not elevated.  - Continue po Lasix.  - Increase Coreg to 12.5 mg bid and Bidil to 1 tab tid with high BP. - No ACEI yet with elevated creatinine.  3. CKD: Creatinine trending down. 4. Atrial fibrillation: Chronic, rate-controlled on Coreg.  As above, stopping heparin and restarting apixaban.   Marca Ancona 09/14/2014 10:45 AM

## 2014-09-15 DIAGNOSIS — I5021 Acute systolic (congestive) heart failure: Secondary | ICD-10-CM

## 2014-09-15 LAB — CBC
HCT: 40.2 % (ref 39.0–52.0)
HEMOGLOBIN: 13.1 g/dL (ref 13.0–17.0)
MCH: 29.2 pg (ref 26.0–34.0)
MCHC: 32.6 g/dL (ref 30.0–36.0)
MCV: 89.5 fL (ref 78.0–100.0)
Platelets: 220 10*3/uL (ref 150–400)
RBC: 4.49 MIL/uL (ref 4.22–5.81)
RDW: 14.9 % (ref 11.5–15.5)
WBC: 8.2 10*3/uL (ref 4.0–10.5)

## 2014-09-15 LAB — GLUCOSE, CAPILLARY
GLUCOSE-CAPILLARY: 178 mg/dL — AB (ref 70–99)
GLUCOSE-CAPILLARY: 169 mg/dL — AB (ref 70–99)
Glucose-Capillary: 145 mg/dL — ABNORMAL HIGH (ref 70–99)
Glucose-Capillary: 185 mg/dL — ABNORMAL HIGH (ref 70–99)

## 2014-09-15 LAB — BASIC METABOLIC PANEL
Anion gap: 10 (ref 5–15)
BUN: 30 mg/dL — AB (ref 6–23)
CHLORIDE: 106 mmol/L (ref 96–112)
CO2: 22 mmol/L (ref 19–32)
Calcium: 9.1 mg/dL (ref 8.4–10.5)
Creatinine, Ser: 1.48 mg/dL — ABNORMAL HIGH (ref 0.50–1.35)
GFR calc non Af Amer: 48 mL/min — ABNORMAL LOW (ref >90)
GFR, EST AFRICAN AMERICAN: 56 mL/min — AB (ref >90)
Glucose, Bld: 176 mg/dL — ABNORMAL HIGH (ref 70–99)
POTASSIUM: 4.2 mmol/L (ref 3.5–5.1)
Sodium: 138 mmol/L (ref 135–145)

## 2014-09-15 NOTE — Progress Notes (Signed)
Called report to [facility] RN [id number], patient is eating lunch will transfer after lunch is complete

## 2014-09-15 NOTE — Progress Notes (Addendum)
Subjective:  Pt seen and examined in AM. No acute events overnight. No family at bedside.    Objective: Vital signs in last 24 hours: Filed Vitals:   09/14/14 2300 09/15/14 0000 09/15/14 0300 09/15/14 0400  BP:  111/70 134/53 134/53  Pulse: 106 82 56 48  Temp: 97.3 F (36.3 C)  97.4 F (36.3 C)   TempSrc: Axillary  Axillary   Resp: Height:      Weight:      SpO2: 99% 99% 99% 98%   Weight change:   Intake/Output Summary (Last 24 hours) at 09/15/14 0746 Last data filed at 09/15/14 0000  Gross per 24 hour  Intake    523 ml  Output    905 ml  Net   -382 ml   PHYSICAL EXAMINATION:   General: NAD HEENT: Bilateral exophthalmos Heart: Irregularly irregular rhythm with normal heart rate.  Lungs: Anterior lung sounds clear.  Abdomen: Soft, non-distended, with normal BS Extremities: Left BKA and right AKA Neuro: Right sided flaccidity, no CN deficit    Lab Results: Basic Metabolic Panel:  Recent Labs Lab 09/11/14 1938  09/14/14 0245 09/15/14 0220  NA  --   < > 138 138  K  --   < > 4.0 4.2  CL  --   < > 105 106  CO2  --   < > 17* 22  GLUCOSE  --   < > 188* 176*  BUN  --   < > 33* 30*  CREATININE  --   < > 1.41* 1.48*  CALCIUM  --   < > 9.0 9.1  MG 1.7  --   --   --   PHOS 4.0  --   --   --   < > = values in this interval not displayed. Liver Function Tests:  Recent Labs Lab 09/11/14 1938  AST 27  ALT 18  ALKPHOS 81  BILITOT 1.3*  PROT 7.0  ALBUMIN 3.1*   CBC:  Recent Labs Lab 09/11/14 1329  09/14/14 0245 09/15/14 0220  WBC 12.5*  < > 12.1* 8.2  NEUTROABS 10.1*  --   --   --   HGB 15.1  < > 13.0 13.1  HCT 44.6  < > 39.0 40.2  MCV 86.8  < > 87.4 89.5  PLT 214  < > 221 220  < > = values in this interval not displayed. Cardiac Enzymes:  Recent Labs Lab 09/11/14 1938 09/11/14 2325 09/12/14 0958  TROPONINI 1.78* 1.66* 0.94*   CBG:  Recent Labs Lab 09/13/14 1627 09/13/14 2151 09/14/14 0738 09/14/14 1156  09/14/14 1713 09/14/14 2111  GLUCAP 261* 167* 187* 221* 191* 157*   Hemoglobin A1C:  Recent Labs Lab 09/11/14 1938  HGBA1C 7.3*   Fasting Lipid Panel:  Recent Labs Lab 09/11/14 1938  CHOL 145  HDL 29*  LDLCALC 96  TRIG 98  CHOLHDL 5.0   Thyroid Function Tests:  Recent Labs Lab 09/11/14 1938 09/12/14 0215 09/13/14 0117  TSH 0.252*  --   --   FREET4  --  1.84*  --   T3FREE  --   --  2.5   Urinalysis:  Recent Labs Lab 09/11/14 1406  COLORURINE AMBER*  LABSPEC 1.028  PHURINE 5.0  GLUCOSEU 100*  HGBUR MODERATE*  BILIRUBINUR SMALL*  KETONESUR 15*  PROTEINUR >300*  UROBILINOGEN 1.0  NITRITE NEGATIVE  LEUKOCYTESUR NEGATIVE    Micro Results: Recent Results (from the past 240 hour(s))  Urine culture     Status: None   Collection Time: 09/11/14  2:06 PM  Result Value Ref Range Status   Specimen Description URINE, CATHETERIZED  Final   Special Requests NONE  Final   Colony Count NO GROWTH Performed at [facility]   Final   Culture NO GROWTH Performed at [facility]   Final   Report Status 09/12/2014 FINAL  Final  MRSA PCR Screening     Status: None   Collection Time: 09/11/14  4:56 PM  Result Value Ref Range Status   MRSA by PCR NEGATIVE NEGATIVE Final    Comment:        The GeneXpert MRSA Assay (FDA approved for NASAL specimens only), is one component of a comprehensive MRSA colonization surveillance program. It is not intended to diagnose MRSA infection nor to guide or monitor treatment for MRSA infections.    Studies/Results: No results found. Medications: I have reviewed the patient's current medications. Scheduled Meds: . apixaban  2.5 mg Oral BID  . aspirin EC  81 mg Oral Daily  . atorvastatin  80 mg Oral q1800  . calcitRIOL  0.25 mcg Oral Daily  . carvedilol  12.5 mg Oral BID WC  . digoxin  0.0625 mg Oral Daily  . furosemide  40 mg Oral Daily  . insulin aspart  0-9 Units Subcutaneous TID WC  .  isosorbide-hydrALAZINE  1 tablet Oral TID   Continuous Infusions: . sodium chloride Stopped (09/14/14 2038)   PRN Meds:.acetaminophen **OR** acetaminophen, albuterol, metoprolol, RESOURCE THICKENUP CLEAR Assessment/Plan:  Hypertension - Currently normotensive. Pt was at home on lopressor 25 mg BID.  -Continue bidil 20-37.5 mg TID, PO lasix 40 mg daily, and carvedilol 12.5 mg BID  -Hold home lisinopril 40 mg daily in setting of AKI  Atrial Fibrillation with RVR - Pt in permanent afib with normal heart rate. Pt was at home on lopressor for rate control and eliquis for [facility] therapy. Unclear trigger, possibly due to hyperthyroidism vs AECHF vs ACS vs uncontrolled hypertension.  -Continue carvedilol 12.5 mg BID and eliquis per pharmacy -Pt to undergo further work-up for hyperthyroidism as outpatient   Primary Hyperthyroidism - TSH 0.252 with free T4 1.84. Free T3 normal and negative anti-TPO. Pt with possible Graves disease. Pt with permanent afib with RVR on admission. -Pt to be scheduled for outpatient radioactive iodine uptake scan  -Follow-up TSI level   Acute on Chronic Systolic CHF in setting of Ischemic Cardiomyopathy - Pt is currently euvolemic. Pt with last 2D-echo September 2013 with EF 25-30%, now reduced to 20-25%. BNP elevated at 2342. Chest xray with bilateral pleural effusions. -Appreciate cardiology consult -Continue PO lasix 40 mg daily, carvedilol 12.5 mg BID, digoxin 0.065mg  daily, and bidil 20-37.5 mg to TID      -Hold home lisinopril 40 mg daily in setting of AKI -Pt not candidate for ICD  Elevated troponin in setting of probable CAD - Troponin peaked at 1.78. Etiology likely demand ischemia in setting of Afib with RVR vs NSTEMI. -Appreciate cardiology recommendations  -Continue home eliquis -Continue aspirin 81 mg daily, atorvastatin 80 mg daily, carvedilol 12.5 mg BID, and bidil 20-37.5 mg TID  -Hold home lisinopril 40 mg daily in setting of AKI  -Pt is not candidate  for invasive cardiac testing  Non-oliguric AKI - Cr down to 1.48 from 1.59. Last Cr 0.92 of May 2015. UA with proteinuria (3.1 mg). Etiology possibly cardiorenal syndrome in setting of reduced systolic EF vs CKD.  -Monitor  daily weights and strict I & O's  -Monitor BMP  -Hold home lisinopril 40 mg daily in setting of AKI  Insulin-dependent Type 2 DM - CBG 145 this AM. Last A1c 7.3 on 09/11/14. Pt at home on Lantus 0-5 U daily and SSI before meals. -Hold home glipizide 5 mg BID and Lantus  -Sensitive SSI with meals -Monitor glucose before meals and at bedtime   History of CVA with right sided hemiparesis and expressive aphasia - Currently at baseline neurological status. -Continue home aspirin 81 mg daily   COPD - No current exacerbation. Pt not on home inhalers. No PFT's on file. -Albuterol nebulizer Q 4 Hr PRN  Hyperlipidemia -Lipid panel with LDL 96. Pt at home on pravastatin daily.  -Continue atorvastatin 80 mg daily    History of Hepatitis C infection -Per pt's sister had hepatitis C infection in past that did not require treatment.  -Follow-up HCV VL -HIV Ab negative  Diet: Heart DVT Ppx: Eliquis  Code: DNR (confirmed by sister and form in chart)    Dispo: Disposition is deferred at this time, awaiting improvement of current medical problems.  Anticipated discharge in approximately 1 day(s).   The patient does have a current PCP (No primary care provider on file.) and does need an [facility] hospital follow-up appointment after discharge.  The patient does have transportation limitations that hinder transportation to clinic appointments.  .Services Needed at time of discharge: Y = Yes, Blank = No PT:   OT:   RN:   Equipment:   Other:     LOS: 4 days   Otis Brace, MD 09/15/2014, 7:46 AM

## 2014-09-15 NOTE — Progress Notes (Signed)
Subjective: Patient had no events overnight. Is alert and resting in bed. Has complete expressive aphasia and does not speak. Does not appear to indicate any pain.   Objective: Vital signs in last 24 hours: Filed Vitals:   09/15/14 0400 09/15/14 0813 09/15/14 0921 09/15/14 1219  BP: 134/53 134/92  124/58  Pulse: 48 56 75   Temp:  97.2 F (36.2 C)    TempSrc:  Axillary    Resp: Height:      Weight:      SpO2: 98% 99%  98%   Weight change:   Intake/Output Summary (Last 24 hours) at 09/15/14 1245 Last data filed at 09/15/14 0900  Gross per 24 hour  Intake    460 ml  Output    605 ml  Net   -145 ml   General: resting in bed HEENT: PERRL, EOMI, minor proptosis, no scleral icterus Cardiac: regular rate, irregular rhythm, no rubs, murmurs or gallops Pulm: auscultation on left clear, moving normal volumes of air, poor inspiratory effort Abd: soft, nontender, nondistended, BS present Ext: L BKA, R AKA, warm and well perfused, no edema  Lab Results: Basic Metabolic Panel:  Recent Labs  16/10/96 0245 09/15/14 0220  NA 138 138  K 4.0 4.2  CL 105 106  CO2 17* 22  GLUCOSE 188* 176*  BUN 33* 30*  CREATININE 1.41* 1.48*  CALCIUM 9.0 9.1   CBC:  Recent Labs  09/14/14 0245 09/15/14 0220  WBC 12.1* 8.2  HGB 13.0 13.1  HCT 39.0 40.2  MCV 87.4 89.5  PLT 221 220   CBG:  Recent Labs  09/14/14 0738 09/14/14 1156 09/14/14 1713 09/14/14 2111 09/15/14 0814 09/15/14 1206  GLUCAP 187* 221* 191* 157* 145* 178*   Thyroid Function Tests:  Recent Labs  09/13/14 0117  T3FREE 2.5   Micro Results: Recent Results (from the past 240 hour(s))  Urine culture     Status: None   Collection Time: 09/11/14  2:06 PM  Result Value Ref Range Status   Specimen Description URINE, CATHETERIZED  Final   Special Requests NONE  Final   Colony Count NO GROWTH Performed at [facility]   Final   Culture NO GROWTH Performed at [facility]   Final   Report Status 09/12/2014 FINAL  Final  MRSA PCR Screening     Status: None   Collection Time: 09/11/14  4:56 PM  Result Value Ref Range Status   MRSA by PCR NEGATIVE NEGATIVE Final    Comment:        The GeneXpert MRSA Assay (FDA approved for NASAL specimens only), is one component of a comprehensive MRSA colonization surveillance program. It is not intended to diagnose MRSA infection nor to guide or monitor treatment for MRSA infections.    Studies/Results: No results found. Medications: I have reviewed the patient's current medications. Scheduled Meds: . apixaban  2.5 mg Oral BID  . aspirin EC  81 mg Oral Daily  . atorvastatin  80 mg Oral q1800  . calcitRIOL  0.25 mcg Oral Daily  . carvedilol  12.5 mg Oral BID WC  . digoxin  0.0625 mg Oral Daily  . furosemide  40 mg Oral Daily  . insulin aspart  0-9 Units Subcutaneous TID WC  . isosorbide-hydrALAZINE  1 tablet Oral TID   Continuous Infusions: . sodium chloride Stopped (09/14/14 2038)   PRN Meds:.acetaminophen **OR** acetaminophen, albuterol, metoprolol, RESOURCE THICKENUP CLEAR Assessment/Plan: Principal Problem:   Atrial  fibrillation with rapid ventricular response Active Problems:   Hypertension   COPD (chronic obstructive pulmonary disease)   T2DM (type 2 diabetes mellitus)   PAD (peripheral artery disease)   Hemiparesis affecting right side as late effect of cerebrovascular accident   Amputee of extremity   Aphasia as late effect of cerebrovascular accident   AKI (acute kidney injury)   Cardiomyopathy, ischemic   Acute on chronic combined systolic and diastolic heart failure - secondary to accelerated hypertension and A. fib RVR   Accelerated hypertension with diastolic congestive heart failure, NYHA class 3   Non-STEMI (non-ST elevated myocardial infarction)   Acute systolic congestive heart failure  Hypertension - Normotensive for last 24 hours. On home lopressor  BID, increased to bidil 37.5mg  TID and  carvedilol 12.5mg  BID (from 6.25).   -Hold home lisinopril 40 mg daily in setting of AKI  Atrial Fibrillation with RVR - Pt in permanent afib with normal heart rate. Pt was at home on lopressor for rate control and eliquis for [facility] therapy. Unclear trigger, possibly due to hyperthyroidism vs AECHF vs ACS vs uncontrolled hypertension. Carvedilol 12.5mg  BID. Switched from heparin drip in setting of ACS to eliquis, renal dosing 2.5mg  -titrate up eliquis as appropriate to home   Primary Hyperthyroidism - TSH 0.252 with free T4 1.84. Free T3 normal (2.5) and negative anti-TPO. Possible evidence of Graves disease with mild proptosis vs hyperthyroid disturbance in setting of critical illness. Pt with permanent afib with RVR on admission. Could consider methimazole or radioactive iodine scan to confirm. -Follow-up TSI level   Acute on Chronic Systolic CHF in setting of Ischemic Cardiomyopathy - Pt with last 2D-echo September 2013 with EF 25-30%, now reduced to 20-25% on 4/8. BNP elevated at 2342. Chest xray with bilateral pleural effusions (small to moderate, w/ basilar atelectasis worse on R) on 09/12/2014. Increased carvedilol 6.25 mg BID to 12.5 mg BID. Increased bidil 20-37.5 mg to TID.Decreased digoxin 0.125 mg daily to 0.065mg  daily -Appreciate cardiology consult -Continue PO lasix 40 mg daily as pt is euvolemic  -Hold home lisinopril 40 mg daily in setting of AKI -Pt not candidate for ICD  Elevated troponin in setting of probable CAD - Troponin peaked at 1.78. Etiology likely demand ischemia in setting of Afib with RVR vs NSTEMI. Patient switched from IV heparin to eliquis at renal dose (2.5mg ) -Appreciate cardiology recommendations  -Continue aspirin 81 mg daily, atorvastatin 80 mg daily, and carvedilol 12.5 mg BID  -Hold home lisinopril 40 mg daily in setting of AKI  -Pt is not candidate for invasive cardiac testing  Non-oliguric AKI - Cr down to 1.48 from 1.59. Last Cr 0.92 of May 2015.  UA with proteinuria (3.1 mg). Etiology possibly cardiorenal syndrome in setting of reduced systolic EF.  -Monitor daily weights and strict I & O's  -Monitor BMP  -Hold home lisinopril 40 mg daily in setting of AKI  Insulin-dependent Type 2 DM - CBG 178 this AM. Last A1c 7.3 on 09/11/14. Pt at home on Lantus 0-5 U daily and SSI before meals. -Hold home glipizide 5 mg BID and Lantus  -Sensitive SSI with meals -Monitor glucose before meals and at bedtime   History of CVA with right sided hemiparesis and expressive aphasia - Currently at baseline neurological status. -Continue home aspirin 81 mg daily   COPD - No current exacerbation. Pt not on home inhalers. No PFT's on file. -Albuterol nebulizer Q 4 Hr PRN  Hyperlipidemia -Lipid panel with LDL 96. Pt at home on  pravastatin daily.  -Continue atorvastatin 80 mg daily   History of Hepatitis C infection -Per pt's sister had hepatitis C infection in past with positive Hep C sAb -Awaiting HCV VL -HIV Ab negative  Diet: Heart DVT Ppx: Eliquis  Code: DNR (confirmed by sister and form in chart)  Dispo: Disposition is deferred at this time, awaiting improvement of current medical problems. Anticipated discharge in approximately 1-2 day(s).   The patient does have a current PCP (No primary care provider on file.) and does need an [facility] hospital follow-up appointment after discharge.  The patient does have transportation limitations that hinder transportation to clinic appointments.  Services Needed at time of discharge: Y = Yes, Blank = No PT:   OT:   RN:   Equipment:   Other:     LOS: 4 days   Leveda Anna, Med Student 09/15/2014, 12:45 PM

## 2014-09-15 NOTE — Progress Notes (Signed)
     SUBJECTIVE: Pt does not speak. Does not indicated chest pain.   BP 134/92 mmHg  Pulse 75  Temp(Src) 97.2 F (36.2 C) (Axillary)  Resp 24  Ht 5\' 6"  (1.676 m)  Wt 97 lb (44 kg)  BMI 15.66 kg/m2  SpO2 99%  Intake/Output Summary (Last 24 hours) at 09/15/14 1049 Last data filed at 09/15/14 0900  Gross per 24 hour  Intake    490 ml  Output    605 ml  Net   -115 ml    PHYSICAL EXAM General: Thin cachectic male. Awake and alert, aphasic at baseline.   Psych:  Good affect, responds appropriately Neck: No JVD. No masses noted.  Lungs: Clear bilaterally with no wheezes or rhonci noted.  Heart: Irregular with no murmurs noted. Abdomen: Bowel sounds are present. Soft, non-tender.  Extremities: Right AKA, left BKA.   LABS: Basic Metabolic Panel:  Recent Labs  62/56/38 0245 09/15/14 0220  NA 138 138  K 4.0 4.2  CL 105 106  CO2 17* 22  GLUCOSE 188* 176*  BUN 33* 30*  CREATININE 1.41* 1.48*  CALCIUM 9.0 9.1   CBC:  Recent Labs  09/14/14 0245 09/15/14 0220  WBC 12.1* 8.2  HGB 13.0 13.1  HCT 39.0 40.2  MCV 87.4 89.5  PLT 221 220   Current Meds: . apixaban  2.5 mg Oral BID  . aspirin EC  81 mg Oral Daily  . atorvastatin  80 mg Oral q1800  . calcitRIOL  0.25 mcg Oral Daily  . carvedilol  12.5 mg Oral BID WC  . digoxin  0.0625 mg Oral Daily  . furosemide  40 mg Oral Daily  . insulin aspart  0-9 Units Subcutaneous TID WC  . isosorbide-hydrALAZINE  1 tablet Oral TID    ASSESSMENT AND PLAN: 64 yo with history of CVA with chronic right hemiparesis and aphasia, CKD, PAD, COPD, chronic atrial fibrillation, known cardiomyopathy of uncertain etiology was admitted with dyspnea. Troponin was noted to be mildly elevated and he was volume overloaded.   1. CAD: Troponin up to 1.7 then trended down. He has no chest pain. He has had a known cardiomyopathy, but has not had catheterization in the past given very poor functional status and comorbidities.He is bed ridden  with bilateral lower leg amputations and aphasia. He is felt to be a poor candidate for invasive evaluation.I agree with this.Possible true ACS/NSTEMI but also possible demand ischemia with volume overload and afib/RVR.I have discussed our plans for conservative management with the patient and his family. He is a DNR.   - No plans for cardiac cath. Will continue medical management with ASA and statin. He is back on Eliquis. Would not add Plavix as he will need Eliquis.   2. Acute on chronic systolic CHF:  Cardiomyopathy known from the past, never had ischemic workup. Strong possibility he has ischemic CMP. EF 20-25% by echo. He has been diuresed and looks euvolemic, JVP not elevated. Continue po Lasix. Will continue Coreg and Bidil. No Ace-inh yet with elevated creatinine.   3. Chronic kidney disease: Creatinine stable, still elevated.   4. Atrial fibrillation, chronic:  Rate-controlled on Coreg and Digoxin. Continue Eliquis.      MCALHANY,CHRISTOPHER  4/11/201610:49 AM

## 2014-09-15 NOTE — Progress Notes (Signed)
INITIAL NUTRITION ASSESSMENT  DOCUMENTATION CODES Per approved criteria  -Severe malnutrition in the context of chronic illness   Pt meets criteria for severe MALNUTRITION in the context of chronic illness as evidenced by severe fat and muscle depletion.  INTERVENTION: -Magic Cup TID with meals  NUTRITION DIAGNOSIS: Malnutrition related to decreased oral intake as evidenced by severe fat and muscle depletion.   Goal: Pt will meet >90% of estimated nutritional needs  Monitor:  PO/supplement intake, labs, weight changes, I/O's  Reason for Assessment: Low braden  64 y.o. male  Admitting Dx: Atrial fibrillation with rapid ventricular response  Patient is a 64 year old man with history of stroke with right hemiparesis, diabetes mellitus, atrial fibrillation, ischemic cardiomyopathy with severe left ventricular dysfunction, COPD, hypertension, and other problems as outlined in the medical history, admitted through the emergency department with complaint of a 2 day history of shortness of breath. Patient has a facial and history was provided by his sister who is his caregiver. He was found to be in atrial fibrillation with a rapid ventricular response in the emergency department.  ASSESSMENT: Pt admitted with a-fib from RVR. He comes from home, where he lives with his sister and caregiver.  Pt unable to participate in interview. No family available at time of visit.  Per meal records, pt consumed 30% of his breakfast this morning. Suspect poor po intake PTA. No wt records currently available, so unable to assess wt changes at this time.  Noted pt with rt AKA and lt BKA upon exam.  Labs reviewed. BUN/Creat: 30/1.48, Glucose: 176, CBGS: 145-191.   Nutrition Focused Physical Exam:  Subcutaneous Fat:  Orbital Region: severe depletion Upper Arm Region: moderate depletion Thoracic and Lumbar Region: severe depletion  Muscle:  Temple Region: severe depletion Clavicle Bone Region:  severe depletion Clavicle and Acromion Bone Region: moderate depletion Scapular Bone Region: moderate depletion Dorsal Hand: moderate depletion Patellar Region: severe depletion Anterior Thigh Region: severe depetion Posterior Calf Region: n/a  Edema: none present  Height: Ht Readings from Last 1 Encounters:  09/12/14 5\' 6"  (1.676 m)    Weight: Wt Readings from Last 1 Encounters:  09/12/14 97 lb (44 kg)    Ideal Body Weight: 121#  % Ideal Body Weight: 80%  Wt Readings from Last 10 Encounters:  09/12/14 97 lb (44 kg)    Usual Body Weight: unknown  % Usual Body Weight: unknown  BMI:  Body mass index is 15.66 kg/(m^2).   Adjusted BMI for amputations: 18.3, meets criteria for underweight.   Estimated Nutritional Needs: Kcal: 1300-1500 Protein: 55-65 grams Fluid: 1.3-1.5 L  Skin: stage I pressure ulcer on coccyx  Diet Order: Diet heart healthy/carb modified Room service appropriate?: Yes; Fluid consistency:: Nectar Thick  EDUCATION NEEDS: -Education not appropriate at this time   Intake/Output Summary (Last 24 hours) at 09/15/14 0936 Last data filed at 09/15/14 0900  Gross per 24 hour  Intake    490 ml  Output    605 ml  Net   -115 ml    Last BM: 09/14/14  Labs:   Recent Labs Lab 09/11/14 1938  09/13/14 0117 09/14/14 0245 09/15/14 0220  NA  --   < > 138 138 138  K  --   < > 3.5 4.0 4.2  CL  --   < > 106 105 106  CO2  --   < > 17* 17* 22  BUN  --   < > 42* 33* 30*  CREATININE  --   < >  1.59* 1.41* 1.48*  CALCIUM  --   < > 8.9 9.0 9.1  MG 1.7  --   --   --   --   PHOS 4.0  --   --   --   --   GLUCOSE  --   < > 134* 188* 176*  < > = values in this interval not displayed.  CBG (last 3)   Recent Labs  09/14/14 1713 09/14/14 2111 09/15/14 0814  GLUCAP 191* 157* 145*    Scheduled Meds: . apixaban  2.5 mg Oral BID  . aspirin EC  81 mg Oral Daily  . atorvastatin  80 mg Oral q1800  . calcitRIOL  0.25 mcg Oral Daily  . carvedilol  12.5 mg  Oral BID WC  . digoxin  0.0625 mg Oral Daily  . furosemide  40 mg Oral Daily  . insulin aspart  0-9 Units Subcutaneous TID WC  . isosorbide-hydrALAZINE  1 tablet Oral TID    Continuous Infusions: . sodium chloride Stopped (09/14/14 2038)    Past Medical History  Diagnosis Date  . Diabetes mellitus without complication   . Stroke   . MI (myocardial infarction)   . Hypertension     Past Surgical History  Procedure Laterality Date  . Below knee leg amputation Left 2008  . Above knee leg amputation Right 2008    [name] A. Mayford Knife, RD, LDN, CDE Pager: [phone] After hours Pager: [phone]

## 2014-09-15 NOTE — Progress Notes (Signed)
Internal Medicine Attending  Date: 09/15/2014  Patient name: [name] Medical record number: [id number] Date of birth: 10/21/1950 Age: 64 y.o. Gender: male  I saw and evaluated the patient, and discussed his care with resident on A.M rounds.  I reviewed the resident's note by Dr. Johna Roles and I agree with the resident's findings and plans as documented in her note.

## 2014-09-15 NOTE — Clinical Documentation Improvement (Signed)
Presents with NSTEMI, Acute on Chronic Systolic CHF, RAF; CKD is documented in 4/9 progress note by Dr. Shirlee Latch.   Black male  GFR's ranging from 59 - 59 this admission  Please clarify the likely Stage of CKD your patient has and document findings in next progress note and include in discharge summary if applicable.  _______CKD Stage I - GFR > OR = 90 _______CKD Stage II - GFR 60-80 _______CKD Stage III - GFR 30-59 _______CKD Stage IV - GFR 15-29 _______CKD Stage V - GFR < 15 _______ESRD (End Stage Renal Disease) _______Other condition_____________ _______Cannot Clinically determine   Thank You, Shellee Milo ,RN Clinical Documentation Specialist:  [phone]  [facility]- Health Information Management

## 2014-09-16 LAB — GLUCOSE, CAPILLARY
GLUCOSE-CAPILLARY: 333 mg/dL — AB (ref 70–99)
GLUCOSE-CAPILLARY: 114 mg/dL — AB (ref 70–99)
Glucose-Capillary: 162 mg/dL — ABNORMAL HIGH (ref 70–99)

## 2014-09-16 LAB — BASIC METABOLIC PANEL
Anion gap: 11 (ref 5–15)
BUN: 31 mg/dL — ABNORMAL HIGH (ref 6–23)
CALCIUM: 9 mg/dL (ref 8.4–10.5)
CHLORIDE: 104 mmol/L (ref 96–112)
CO2: 23 mmol/L (ref 19–32)
Creatinine, Ser: 1.56 mg/dL — ABNORMAL HIGH (ref 0.50–1.35)
GFR calc Af Amer: 52 mL/min — ABNORMAL LOW (ref >90)
GFR, EST NON AFRICAN AMERICAN: 45 mL/min — AB (ref >90)
Glucose, Bld: 185 mg/dL — ABNORMAL HIGH (ref 70–99)
POTASSIUM: 3.7 mmol/L (ref 3.5–5.1)
Sodium: 138 mmol/L (ref 135–145)

## 2014-09-16 MED ORDER — RESOURCE THICKENUP CLEAR PO POWD
ORAL | Status: DC | PRN
  Filled 2014-09-16: qty 125

## 2014-09-16 NOTE — Progress Notes (Signed)
Patient Name: [name] Date of Encounter: 09/16/2014  Principal Problem:   Atrial fibrillation with rapid ventricular response Active Problems:   Hypertension   COPD (chronic obstructive pulmonary disease)   T2DM (type 2 diabetes mellitus)   PAD (peripheral artery disease)   Hemiparesis affecting right side as late effect of cerebrovascular accident   Amputee of extremity   Aphasia as late effect of cerebrovascular accident   AKI (acute kidney injury)   Cardiomyopathy, ischemic   Acute on chronic combined systolic and diastolic heart failure - secondary to accelerated hypertension and A. fib RVR   Accelerated hypertension with diastolic congestive heart failure, NYHA class 3   Non-STEMI (non-ST elevated myocardial infarction)   Acute systolic congestive heart failure   Primary Cardiologist:   Patient Profile: 64 yo with history of CVA with chronic right hemiparesis and aphasia, CKD, PAD, COPD, chronic atrial fibrillation, known cardiomyopathy of uncertain etiology was admitted with dyspnea. Troponin was noted to be mildly elevated and he was volume overloaded.   SUBJECTIVE: Does not communicate well, indicates abdominal pain, ?upper abd  OBJECTIVE Filed Vitals:   09/15/14 1219 09/15/14 1300 09/15/14 2049 09/16/14 0445  BP: 124/58 131/57 117/52 125/66  Pulse:  77 87 71  Temp:  97.4 F (36.3 C) 97.7 F (36.5 C) 98.1 F (36.7 C)  TempSrc:  Axillary Oral Oral  Resp: 12 16 18 16   Height:      Weight:      SpO2: 98% 96% 98% 99%    Intake/Output Summary (Last 24 hours) at 09/16/14 1100 Last data filed at 09/16/14 0800  Gross per 24 hour  Intake    720 ml  Output    600 ml  Net    120 ml   Filed Weights   09/12/14 0800  Weight: 97 lb (44 kg)    PHYSICAL EXAM General: Well developed, well nourished, male in no acute distress. Head: Normocephalic, atraumatic.  Neck: Supple without bruits, JVD 8 cm. Lungs:  Resp regular and unlabored, poor inspiratory  effort, upper lobes clear. Heart: Irreg irreg, S1, S2, no S3, S4, or murmur; no rub. Abdomen: Soft, non-tender, non-distended, BS + x 4.  Extremities: No clubbing, cyanosis, edema. S/p R AKA, L BKA Neuro: Alert and responds to verbal appropriately. Aphasic Moves L arm spontaneously.  LABS: CBC: Recent Labs  09/14/14 0245 09/15/14 0220  WBC 12.1* 8.2  HGB 13.0 13.1  HCT 39.0 40.2  MCV 87.4 89.5  PLT 221 220   Basic Metabolic Panel: Recent Labs  09/15/14 0220 09/16/14 0402  NA 138 138  K 4.2 3.7  CL 106 104  CO2 22 23  GLUCOSE 176* 185*  BUN 30* 31*  CREATININE 1.48* 1.56*  CALCIUM 9.1 9.0   BNP:  B NATRIURETIC PEPTIDE  Date/Time Value Ref Range Status  09/11/2014 07:38 PM 2342.7* 0.0 - 100.0 pg/mL Final    TELE:  Afib, RVR at times, PVCs     Dg Chest 2 View 09/12/2014   CLINICAL DATA:  Acute on chronic congestive heart failure. Atrial fibrillation.  EXAM: CHEST  2 VIEW  COMPARISON:  Single view of the chest 09/11/2014.  FINDINGS: There is cardiomegaly without evidence pulmonary edema. Small to moderate pleural effusions persist, larger on the right. The lungs appear emphysematous.  IMPRESSION: Small to moderate pleural effusions and basilar atelectasis, worse on the right.  Cardiomegaly without edema.  Emphysema.   Electronically Signed   By: Drusilla Kanner M.D.   On:  09/12/2014 07:38    Current Medications:  . apixaban  2.5 mg Oral BID  . aspirin EC  81 mg Oral Daily  . atorvastatin  80 mg Oral q1800  . calcitRIOL  0.25 mcg Oral Daily  . carvedilol  12.5 mg Oral BID WC  . digoxin  0.0625 mg Oral Daily  . insulin aspart  0-9 Units Subcutaneous TID WC  . isosorbide-hydrALAZINE  1 tablet Oral TID   . sodium chloride Stopped (09/14/14 2038)    ASSESSMENT AND PLAN: 1. CAD: Troponin up to 1.7 then trended down. He has no obvious chest pain. He has a known cardiomyopathy, but has not had cath in the past given very poor functional status and comorbidities.He is  bed ridden with bilateral lower leg amputations and aphasia. He is felt to be a poor candidate for invasive evaluation.I agree with this.Possible true ACS/NSTEMI but also possible demand ischemia with volume overload and afib/RVR.I have discussed our plans for conservative management with the patient and his family. He is a DNR.  - No plans for cardiac cath. Will continue medical management with ASA, BB and statin. He is back on Eliquis. Would not add Plavix as he will need Eliquis.   2. Acute on chronic systolic CHF: Cardiomyopathy known from the past, never had ischemic workup. Strong possibility he has ischemic CMP. EF 20-25% by echo. He has been diuresed and looks euvolemic, JVP slightly elevated. Continue po Lasix. Will continue Coreg and Bidil. No Ace/ARB with elevated creatinine. Need daily weights   3. Chronic kidney disease: Creatinine stable, still elevated.   4. Atrial fibrillation, chronic: Rate-controlled on Coreg and Digoxin. Continue Eliquis. - PTA on dig and Lopressor 25 mg bid  Otherwise, per IM Principal Problem:   Atrial fibrillation with rapid ventricular response  Active Problems:   Hypertension   COPD (chronic obstructive pulmonary disease)   T2DM (type 2 diabetes mellitus)   PAD (peripheral artery disease)   Hemiparesis affecting right side as late effect of cerebrovascular accident   Amputee of extremity   Aphasia as late effect of cerebrovascular accident   AKI (acute kidney injury)   Cardiomyopathy, ischemic   Acute on chronic combined systolic and diastolic heart failure - secondary to accelerated hypertension and A. fib RVR   Accelerated hypertension with diastolic congestive heart failure, NYHA class 3   Non-STEMI (non-ST elevated myocardial infarction)   Acute systolic congestive heart failure  Signed, Theodore Demark , PA-C 11:00 AM 09/16/2014   Attending Note:   The patient was seen and examined.  Agree with assessment and plan as  noted above.  Changes made to the above note as needed.  He is stable . Continue current meds. He may follow up with his primary medical doctor. Will sign off.    Vesta Mixer, Montez Hageman., MD, [facility] 09/16/2014, 2:16 PM [address]  [address] Office [phone] Pager [phone]

## 2014-09-16 NOTE — Progress Notes (Signed)
Subjective: Patient is non-verbal. Does not seem to indicate any pain or discomfort.   Objective: Vital signs in last 24 hours: Filed Vitals:   09/15/14 1219 09/15/14 1300 09/15/14 2049 09/16/14 0445  BP: 124/58 131/57 117/52 125/66  Pulse:  77 87 71  Temp:  97.4 F (36.3 C) 97.7 F (36.5 C) 98.1 F (36.7 C)  TempSrc:  Axillary Oral Oral  Resp: 12 16 18 16   Height:      Weight:      SpO2: 98% 96% 98% 99%   Weight change:   Intake/Output Summary (Last 24 hours) at 09/16/14 1011 Last data filed at 09/16/14 0800  Gross per 24 hour  Intake    720 ml  Output    600 ml  Net    120 ml   General: resting in bed HEENT: PERRL, EOMI, no scleral icterus, mild b/l proptosis Cardiac: irregular rhythm, normal rate, no rubs, murmurs or gallops Pulm: poor inspiratory effort, no increased WOB, breath sounds difficult to auscultate, diminished b/l  Abd: soft, nontender, nondistended, BS present Ext: Left BKA and right AKA, non-edematous, some skin breakdown and scabbing Neuro: right sided flacid hemiparesis, gestures and follows commands.   Lab Results: Basic Metabolic Panel:  Recent Labs  69/50/72 0220 09/16/14 0402  NA 138 138  K 4.2 3.7  CL 106 104  CO2 22 23  GLUCOSE 176* 185*  BUN 30* 31*  CREATININE 1.48* 1.56*  CALCIUM 9.1 9.0   CBC:  Recent Labs  09/14/14 0245 09/15/14 0220  WBC 12.1* 8.2  HGB 13.0 13.1  HCT 39.0 40.2  MCV 87.4 89.5  PLT 221 220   CBG:  Recent Labs  09/14/14 1713 09/14/14 2111 09/15/14 0814 09/15/14 1206 09/15/14 1617 09/15/14 2053  GLUCAP 191* 157* 145* 178* 185* 169*   Micro Results: Recent Results (from the past 240 hour(s))  Urine culture     Status: None   Collection Time: 09/11/14  2:06 PM  Result Value Ref Range Status   Specimen Description URINE, CATHETERIZED  Final   Special Requests NONE  Final   Colony Count NO GROWTH Performed at [facility]   Final   Culture NO GROWTH Performed at [facility]   Final   Report Status 09/12/2014 FINAL  Final  MRSA PCR Screening     Status: None   Collection Time: 09/11/14  4:56 PM  Result Value Ref Range Status   MRSA by PCR NEGATIVE NEGATIVE Final    Comment:        The GeneXpert MRSA Assay (FDA approved for NASAL specimens only), is one component of a comprehensive MRSA colonization surveillance program. It is not intended to diagnose MRSA infection nor to guide or monitor treatment for MRSA infections.    Studies/Results: No results found. Medications: I have reviewed the patient's current medications. Scheduled Meds: . apixaban  2.5 mg Oral BID  . aspirin EC  81 mg Oral Daily  . atorvastatin  80 mg Oral q1800  . calcitRIOL  0.25 mcg Oral Daily  . carvedilol  12.5 mg Oral BID WC  . digoxin  0.0625 mg Oral Daily  . insulin aspart  0-9 Units Subcutaneous TID WC  . isosorbide-hydrALAZINE  1 tablet Oral TID   Continuous Infusions: . sodium chloride Stopped (09/14/14 2038)   PRN Meds:.acetaminophen **OR** acetaminophen, albuterol, metoprolol, RESOURCE THICKENUP CLEAR Assessment/Plan: Principal Problem:   Atrial fibrillation with rapid ventricular response Active Problems:   Hypertension   COPD (chronic  obstructive pulmonary disease)   T2DM (type 2 diabetes mellitus)   PAD (peripheral artery disease)   Hemiparesis affecting right side as late effect of cerebrovascular accident   Amputee of extremity   Aphasia as late effect of cerebrovascular accident   AKI (acute kidney injury)   Cardiomyopathy, ischemic   Acute on chronic combined systolic and diastolic heart failure - secondary to accelerated hypertension and A. fib RVR   Accelerated hypertension with diastolic congestive heart failure, NYHA class 3   Non-STEMI (non-ST elevated myocardial infarction)   Acute systolic congestive heart failure  Hypertension - Normotensive for last 24 hours. On home lopressor  BID, bidil 20-37.5mg  TID, and carvedilol 12.5mg   BID   -Hold home lisinopril 40 mg daily in setting of AKI - Holding PO lasix in setting of worsening/nonresolving AKI  Atrial Fibrillation with RVR - Pt in permanent afib with normal heart rate. Pt was at home on lopressor for rate control and eliquis for [facility] therapy. Unclear trigger, possibly due to hyperthyroidism vs AECHF vs ACS vs uncontrolled hypertension. Carvedilol 12.5mg  BID, eliquis 2.5mg  -further work-up for hyperthyroidism as below  Primary Hyperthyroidism - TSH 0.252 with free T4 1.84. Free T3 normal (2.5) and negative anti-TPO. Possible evidence of Graves disease with mild proptosis vs hyperthyroid disturbance in setting of critical illness. Pt with permanent afib with RVR on admission.  -Follow-up TSI level  - schedule RAI for outpatient evaluation; will hold methimazole until after RAI scan/uptake  - Recheck TSH 2-4 weeks to confirm elevation outside of acute setting  Acute on Chronic Systolic CHF in setting of Ischemic Cardiomyopathy - Euvolemic on exam. Pt with last 2D-echo September 2013 with EF 25-30%, now reduced to 20-25% on 4/8. BNP elevated at 2342.  -Appreciate cardiology consult - continue carvedilol 12.5mg  BID, bidil 20-37.5 mg TID, digoxin 0.065mg  qday - hold lasix/home lisinopril in setting of worsening/nonresolving AKI  -Pt not candidate for ICD  Non-oliguric AKI - Cr 1.56 (1.93 on admission). Last Cr 0.92 of May 2015. Etiology initially possibly cardiorenal syndrome in setting of reduced systolic EF, unresolving despite patient being euvolemic, may be 2/2 over diuresis -Monitor daily weights and strict I & O's  -Monitor BMP  -Hold home lisinopril 40 mg daily in setting of AKI - hold PO lasix  Insulin-dependent Type 2 DM - CBG 185 this AM. Last A1c 7.3 on 09/11/14. Pt at home on Lantus 0-5 U daily and SSI before meals. 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 -Continue home aspirin 81 mg daily   Hyperlipidemia -Lipid panel with LDL 96. Pt at home on pravastatin daily.  -Continue atorvastatin 80 mg daily   History of Hepatitis C infection -Per pt's sister had hepatitis C infection in past with positive Hep C sAb. HIV negative -Awaiting HCV VL  Diet: Heart DVT Ppx: Eliquis  Code: DNR (confirmed by sister and form in chart)  Dispo: Disposition is deferred at this time, awaiting improvement of current medical problems. Anticipated discharge in approximately 1-2 day(s).   The patient does have a current PCP (No primary care provider on file.) and does need an [facility] hospital follow-up appointment after discharge.  The patient does have transportation limitations that hinder transportation to clinic appointments. .Services Needed at time of discharge: Y = Yes, Blank = No PT:   OT:  RN:   Equipment:   Other:     LOS: 5 days   Leveda Anna, Med Student 09/16/2014, 10:11 AM

## 2014-09-16 NOTE — Progress Notes (Signed)
Subjective:  [name] was seen and examined at bedside. He was sleeping but arousable. Non-verbal but does follow commands.   Objective: Vital signs in last 24 hours: Filed Vitals:   09/15/14 1219 09/15/14 1300 09/15/14 2049 09/16/14 0445  BP: 124/58 131/57 117/52 125/66  Pulse:  77 87 71  Temp:  97.4 F (36.3 C) 97.7 F (36.5 C) 98.1 F (36.7 C)  TempSrc:  Axillary Oral Oral  Resp: Height:      Weight:      SpO2: 98% 96% 98% 99%   Weight change:   Intake/Output Summary (Last 24 hours) at 09/16/14 0740 Last data filed at 09/16/14 0448  Gross per 24 hour  Intake    680 ml  Output    600 ml  Net     80 ml   PHYSICAL EXAMINATION:   General: lying in bed, does not appear in distress, non-verbal HEENT: Bilateral exophthalmos Heart: Irregularly irregular Lungs: poor effort, difficult to auscultate, clear in upper lobes, diminished at bases Abdomen: Soft, non-distended, BS present, abdominal pulsation ?bruit Extremities: Left BKA and right AKA Neuro: Right sided flaccidity, following commands, makes gestures with hands, smiling, good grip strength with L hand Skin: Dry  Lab Results: Basic Metabolic Panel:  Recent Labs Lab 09/11/14 1938  09/15/14 0220 09/16/14 0402  NA  --   < > 138 138  K  --   < > 4.2 3.7  CL  --   < > 106 104  CO2  --   < > 22 23  GLUCOSE  --   < > 176* 185*  BUN  --   < > 30* 31*  CREATININE  --   < > 1.48* 1.56*  CALCIUM  --   < > 9.1 9.0  MG 1.7  --   --   --   PHOS 4.0  --   --   --   < > = values in this interval not displayed. Liver Function Tests:  Recent Labs Lab 09/11/14 1938  AST 27  ALT 18  ALKPHOS 81  BILITOT 1.3*  PROT 7.0  ALBUMIN 3.1*   CBC:  Recent Labs Lab 09/11/14 1329  09/14/14 0245 09/15/14 0220  WBC 12.5*  < > 12.1* 8.2  NEUTROABS 10.1*  --   --   --   HGB 15.1  < > 13.0 13.1  HCT 44.6  < > 39.0 40.2  MCV 86.8  < > 87.4 89.5  PLT 214  < > 221 220  < > = values in this interval not  displayed. Cardiac Enzymes:  Recent Labs Lab 09/11/14 1938 09/11/14 2325 09/12/14 0958  TROPONINI 1.78* 1.66* 0.94*   CBG:  Recent Labs Lab 09/14/14 1713 09/14/14 2111 09/15/14 0814 09/15/14 1206 09/15/14 1617 09/15/14 2053  GLUCAP 191* 157* 145* 178* 185* 169*   Hemoglobin A1C:  Recent Labs Lab 09/11/14 1938  HGBA1C 7.3*   Fasting Lipid Panel:  Recent Labs Lab 09/11/14 1938  CHOL 145  HDL 29*  LDLCALC 96  TRIG 98  CHOLHDL 5.0   Thyroid Function Tests:  Recent Labs Lab 09/11/14 1938 09/12/14 0215 09/13/14 0117  TSH 0.252*  --   --   FREET4  --  1.84*  --   T3FREE  --   --  2.5   Urinalysis:  Recent Labs Lab 09/11/14 1406  COLORURINE AMBER*  LABSPEC 1.028  PHURINE 5.0  GLUCOSEU 100*  HGBUR MODERATE*  BILIRUBINUR SMALL*  KETONESUR 15*  PROTEINUR >300*  UROBILINOGEN 1.0  NITRITE NEGATIVE  LEUKOCYTESUR NEGATIVE   Micro Results: Recent Results (from the past 240 hour(s))  Urine culture     Status: None   Collection Time: 09/11/14  2:06 PM  Result Value Ref Range Status   Specimen Description URINE, CATHETERIZED  Final   Special Requests NONE  Final   Colony Count NO GROWTH Performed at [facility]   Final   Culture NO GROWTH Performed at [facility]   Final   Report Status 09/12/2014 FINAL  Final  MRSA PCR Screening     Status: None   Collection Time: 09/11/14  4:56 PM  Result Value Ref Range Status   MRSA by PCR NEGATIVE NEGATIVE Final    Comment:        The GeneXpert MRSA Assay (FDA approved for NASAL specimens only), is one component of a comprehensive MRSA colonization surveillance program. It is not intended to diagnose MRSA infection nor to guide or monitor treatment for MRSA infections.    Medications: I have reviewed the patient's current medications. Scheduled Meds: . apixaban  2.5 mg Oral BID  . aspirin EC  81 mg Oral Daily  . atorvastatin  80 mg Oral q1800  . calcitRIOL  0.25 mcg Oral Daily    . carvedilol  12.5 mg Oral BID WC  . digoxin  0.0625 mg Oral Daily  . furosemide  40 mg Oral Daily  . insulin aspart  0-9 Units Subcutaneous TID WC  . isosorbide-hydrALAZINE  1 tablet Oral TID   Continuous Infusions: . sodium chloride Stopped (09/14/14 2038)   PRN Meds:.acetaminophen **OR** acetaminophen, albuterol, metoprolol, RESOURCE THICKENUP CLEAR Assessment/Plan:  [name] is a 64 year old male with PMH of DM2, HTN, prior MI, and CVA admitted for Afib RVR and acute on chronic systolic heart failure.   Hypertension - Normotensive. Pt was at home on lopressor 25 mg BID.  -Continue bidil 20-37.5 mg TID and carvedilol 12.5 mg BID  -Holding home lisinopril 40 mg daily and will hold lasix in setting of AKI  Atrial Fibrillation with RVR - Pt in permanent afib with now controlled heart rate. -Continue carvedilol 12.5 mg BID and eliquis -Pt to undergo further work-up for hyperthyroidism as outpatient   Primary Hyperthyroidism - TSH 0.252 with free T4 1.84. Free T3 normal and negative anti-TPO. Pt with possible Graves disease. Pt with permanent afib with RVR initially on admission, now rate controlled. -Pt to be scheduled for outpatient radioactive iodine uptake scan  -Follow-up TSI level   Acute on Chronic Systolic CHF in setting of Ischemic Cardiomyopathy and NSTEMI - Noted to be euvolemic yesterday. Echo this admission with EF 20-25%, mild LVH, diffuse hypokinesis, PA 35mm Hg, severe global reduction in LV function. BNP elevated at 2342. Chest xray with bilateral pleural effusions. -Appreciate cardiology following--poor candidate for invasive evaluation, continue with conservative management -Continue carvedilol 12.5 mg BID, digoxin 0.065mg  daily, and bidil 20-37.5 mg to TID, ASA, statin, and Eliquis    -Hold home lisinopril 40 mg daily in setting of AKI. Hold lasix today given rising Cr and appears euvolemic  Non-oliguric AKI - Cr trending back up. Net -1.79L since admission.  Unclear baseline, ?cardiorenal syndrome vs. CKD 3. Potentially increasing now given continued diuresis.   Recent Labs Lab 09/12/14 0215 09/13/14 0117 09/14/14 0245 09/15/14 0220 09/16/14 0402  CREATININE 1.50* 1.59* 1.41* 1.48* 1.56*    -No weight since 4/8, will get weight today,  97lb's on admission and continue to monitor I & O's  -Monitor BMP  -Hold home lisinopril 40 mg daily in setting of AKI. Will hold lasix today as well vs. Reduced dose.   Insulin-dependent Type 2 DM - Last A1c 7.3 on 09/11/14. Pt at home on Lantus 0-5 U daily and SSI before meals. -Holding home glipizide 5 mg BID and Lantus  -Sensitive SSI with meals -Monitor CBGs  History of CVA with right sided hemiparesis and expressive aphasia -Continue home aspirin 81 mg daily    COPD -has not required albuterol nebulizer treatments  Hyperlipidemia -Lipid panel with LDL 96. Pt at home on pravastatin daily.  -Continue atorvastatin 80 mg daily    History of Hepatitis C infection -Per pt's sister had hepatitis C infection in past that did not require treatment.  -Follow-up HCV VL -HIV Ab negative  Diet: HH./Carb modified DVT Ppx: Eliquis  Code: DNR (confirmed by sister and form in chart) Dispo: Disposition is deferred at this time, awaiting improvement of current medical problems.  Anticipated discharge in approximately 1 day(s).   The patient does have a current PCP (No primary care provider on file.) and does need an [facility] hospital follow-up appointment after discharge.  The patient does have transportation limitations that hinder transportation to clinic appointments.  Services Needed at time of discharge: Y = Yes, Blank = No PT:   OT:   RN:   Equipment:   Other:     LOS: 5 days   Baltazar Apo, MD 09/16/2014, 7:40 AM

## 2014-09-17 LAB — BASIC METABOLIC PANEL
ANION GAP: 11 (ref 5–15)
BUN: 29 mg/dL — ABNORMAL HIGH (ref 6–23)
CALCIUM: 8.8 mg/dL (ref 8.4–10.5)
CO2: 22 mmol/L (ref 19–32)
Chloride: 105 mmol/L (ref 96–112)
Creatinine, Ser: 1.38 mg/dL — ABNORMAL HIGH (ref 0.50–1.35)
GFR calc Af Amer: 60 mL/min — ABNORMAL LOW (ref >90)
GFR calc non Af Amer: 52 mL/min — ABNORMAL LOW (ref >90)
Glucose, Bld: 173 mg/dL — ABNORMAL HIGH (ref 70–99)
POTASSIUM: 3.6 mmol/L (ref 3.5–5.1)
SODIUM: 138 mmol/L (ref 135–145)

## 2014-09-17 LAB — GLUCOSE, CAPILLARY
Glucose-Capillary: 173 mg/dL — ABNORMAL HIGH (ref 70–99)
Glucose-Capillary: 216 mg/dL — ABNORMAL HIGH (ref 70–99)

## 2014-09-17 MED ORDER — ATORVASTATIN CALCIUM 40 MG PO TABS: 40.0000 mg | ORAL_TABLET | Freq: Every day | ORAL | Status: AC

## 2014-09-17 MED ORDER — ISOSORB DINITRATE-HYDRALAZINE 20-37.5 MG PO TABS: 1.0000 | ORAL_TABLET | Freq: Three times a day (TID) | ORAL | Status: DC

## 2014-09-17 MED ORDER — APIXABAN 2.5 MG PO TABS: 2.5000 mg | ORAL_TABLET | Freq: Two times a day (BID) | ORAL | Status: DC

## 2014-09-17 MED ORDER — ATORVASTATIN CALCIUM 80 MG PO TABS: 80.0000 mg | ORAL_TABLET | Freq: Every day | ORAL | Status: DC

## 2014-09-17 MED ORDER — CARVEDILOL 12.5 MG PO TABS: 12.5000 mg | ORAL_TABLET | Freq: Two times a day (BID) | ORAL | Status: AC

## 2014-09-17 MED ORDER — ATORVASTATIN CALCIUM 80 MG PO TABS: 40.0000 mg | ORAL_TABLET | Freq: Every day | ORAL | Status: DC

## 2014-09-17 MED ORDER — APIXABAN 2.5 MG PO TABS: 2.5000 mg | ORAL_TABLET | Freq: Two times a day (BID) | ORAL | Status: AC

## 2014-09-17 MED ORDER — DIGOXIN 125 MCG PO TABS: 0.0625 mg | ORAL_TABLET | Freq: Every day | ORAL | Status: DC

## 2014-09-17 NOTE — Progress Notes (Signed)
Patient discharged to home via ambulance. Discharge instructions/paperwork given to transport personnel. Patient's sister awaiting patients arrival to house. afleming RN

## 2014-09-17 NOTE — Discharge Summary (Signed)
Name: [name] MRN: [id number] DOB: December 11, 1950 64 y.o. PCP: No primary care provider on file.  Date of Admission: 09/11/2014 11:50 AM Date of Discharge: 09/17/2014 Attending Physician: Margarito Liner, MD  Discharge Diagnosis:  Permanent Atrial Fibrillation with RVR  Acute on Chronic Systolic CHF in setting of Probable Ischemic Cardiomyopathy Elevated troponin in setting of probable CAD  Hypertension Non-oliguric AKI on probable CKD Stage 3  Possible Primary Hyperthyroidism e.  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     aspirin EC 81 MG tablet  Take 81 mg by mouth daily.     atorvastatin 40 MG tablet  Commonly known as:  LIPITOR  Take 1 tablet (40 mg total) by mouth daily.     calcitRIOL 0.25 MCG capsule  Commonly known as:  ROCALTROL  Take 0.25 mcg by mouth daily.     carvedilol 12.5 MG tablet  Commonly known as:  COREG  Take 1 tablet (12.5 mg total) by mouth 2 (two) times daily with a meal.     digoxin 0.125 MG tablet  Commonly known as:  LANOXIN  Take 0.5 tablets (0.0625 mg total) by mouth daily.     glipiZIDE 5 MG tablet  Commonly known as:  GLUCOTROL  Take 5 mg by mouth 2 (two) times daily before a meal.     insulin aspart 100 UNIT/ML injection  Commonly known as:  novoLOG  Inject 0-9 Units into the skin 3 (three) times daily before meals. If 70-120  give 0units, if 121-150 give 1 units, 151-200 give 2 units, 201-250 give 3 units, 251-300 give 5 units, 301-350 give 7 units, 351-400 give 9 units     insulin glargine 100 UNIT/ML injection  Commonly known as:  LANTUS  Inject 0-5 Units into the skin at bedtime. If BS is 70-200 give 0units, 201-250 give 2 units, 251-300 give 3 units, 301-350 give 4 units, 351-400 give 5 units.     isosorbide-hydrALAZINE 20-37.5 MG per tablet  Commonly known as:  BIDIL  Take 1 tablet by mouth 3 (three) times daily.        Disposition and follow-up:   Mr.[name] was discharged from [facility] in Good condition.  At the hospital follow up visit please address:  1.  Compliance with medications - carvedilol 12.5 mg BID, digoxin 0.0625 mg daily, bidil 20-37.5 mg TID, lasix 20 mg daily, eliquis 2.5 mg daily, atorvastatin 40 mg daily. Make sure no longer taking pravastatin or lopressor.        Due to AKI, lisinopril was held, please resume when renal function stable, needs BMP         Adjust lasix as necessary, was discharged on 20 mg daily, needs BMP  Possible hyperthyroidism - Pt was scheduled for 2-day RAI on April 26 & 27. Needs repeat TSH and free T4 level in 6 weeks          Pt on aspirin 81 mg daily and eliquis, consider discontinuation of aspirin due to increased bleeding risk                Noted to have stage I pressure ulcer on coccyx - Please assess        Severe malnutrition - Needs to be on nutrition supplements   2.  Labs / imaging needed at time of follow-up: BMP (Cr), Hepatitis C viral load, TSH and free T4 in 6 weeks  3.  Pending labs/ test needing follow-up: TSI, HCV viral load (this was for some reason cancelled)  Follow-up Appointments:     Follow-up Information    Follow up with Coralyn Mark On 09/23/2014.   Why:  2:00PM   Contact information:   [facility] [address] [place],  [place], [place] [address]      Follow up with Nuclear  Medicine On 09/30/2014.   Why:  2:00PM, you will need to arrive at 1:45PM. This is a two-day series, so you will need to arrive at 1:45PM on April 26 and again on April 27.   Contact information:   [facility] - Nuclear Medicine      Discharge Instructions:  We have changed the following medications: They are at your pharmacy at [facility] on [address] [facility].   Please start taking Lasix 20mg  every day starting tomorrow, 4/14  Plast start taking Coreg 12.5mg  twice a day  Please start taking digoxin .025mg  (reduced dose from your pre-admission dose)  Please start taking Bidil 20-37.5mg  three times a day  Please STOP taking Lopressor.   Please STOP taking pravastatin and START taking Lipitor 40 mg daily   Please do not take lisinopril until you have been evaluated by your PCP  You are scheduled for a thyroid study on April 26th and 27th - this is a two-day study. Please return to Nuclear Medicine department at [facility] for this test.    Consultations:  Cardiology   Procedures Performed:  Dg Chest 2 View  09/12/2014   CLINICAL DATA:  Acute on chronic congestive heart failure. Atrial fibrillation.  EXAM: CHEST  2 VIEW  COMPARISON:  Single view of the chest 09/11/2014.  FINDINGS: There is cardiomegaly without evidence pulmonary edema. Small to moderate pleural effusions persist, larger on the right. The lungs appear emphysematous.  IMPRESSION: Small to moderate pleural effusions and basilar atelectasis, worse on the right.  Cardiomegaly without edema.  Emphysema.   Electronically Signed   By: Drusilla Kanner M.D.   On: 09/12/2014 07:38   Dg Chest Portable 1 View  09/11/2014   CLINICAL DATA:  History of CVA, diabetes and previous MI, current hyperglycemia, previous history of tobacco use  EXAM: PORTABLE CHEST - 1 VIEW  COMPARISON:  None.  FINDINGS: Multiple portable films reveal the lungs to be hyperinflated. There are pleural effusions layering posteriorly, bilaterally. The cardiac  silhouette is enlarged. The pulmonary vascularity is engorged. The mediastinum is normal in width.  IMPRESSION: COPD with superimposed CHF with mild interstitial edema. There is no alveolar pneumonia.   Electronically Signed   By: David  [place]   On: 09/11/2014 13:24    2D Echo: 09/12/14 Study Conclusions  - Left ventricle: The cavity size was normal. Wall thickness was increased in a pattern  of mild LVH. Systolic function was severely reduced. The estimated ejection fraction was in the range of 20% to 25%. Diffuse hypokinesis. - Left atrium: The atrium was moderately dilated. - Right atrium: The atrium was mildly dilated. - Atrial septum: The septum bowed from left to right, consistent with increased left atrial pressure. - Pulmonary arteries: Systolic pressure was mildly increased. PA peak pressure: 35 mm Hg (S). - Pericardium, extracardiac: There was a left pleural effusion.  Impressions:  - Definity used; Severe global reduction in LV function; spontaneous contrast in LV consistent with low flow state; biatrial enlargement; mild TR; mildly elevated pulmonary pressure; left pleural effusion.   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   Pt is non-verbal and history consequently obtained from sister who has been his caretaker for past 8 years. She reports that for the past 2 days his blood sugars having been running high and low and has been tachypneic, dyspneic, and coughing/possible choking after eating. She reports he has not been eating much or acting himself for the past few days.  He had  echo in 2013 that revealed EF 30-35% and is on lopressor and digoxin at home. He also has afib on lopressor for rate control and eliquis for anti-coagulation. He has not been hospitalized since May of 2015.   Hospital Course by problem list:   Permanent Atrial Fibrillation with RVR - Pt in permanent afib with RVR on admission with peak HR 136. Pt was at home on lopressor for rate control and eliquis for [facility] therapy. Etiology of worsening of afib unclear, possibly due to undiagnosed hyperthyroidism vs acute on chronic exacerbation of CHF vs ACS vs uncontrolled hypertension. Pt initially received nitroglycerin and heparin drip. Pt's home lopressor was changed to carvedilol 12.5 mg BID and dosage of eliquis was changed from 5 mg daily to 2.5 mg due to renal insufficieny. Pt to undergo further work-up for hyperthyroidism as outpatient with RAI. His heart rate normalized during hospitalization and remained in permanent afib.    Acute on Chronic Systolic CHF in setting of Probable Ischemic Cardiomyopathy - Pt with last 2D-echo September 2013 with EF 25-30% that was reduced to 20-25% on 2D-echo on 09/12/14.  Pt was not ICD or invasive cardiac testing candidate per cardiology. It is unclear if he has ischemic vs nonischemic cardiomyopathy. BNP was elevated at 2342 and chest xray with bilateral pleural effusions on admission with tachypnea and increased work of breathing . Pt was not on diuretic therapy at home. Pt received IV lasix that was transitioned to PO 40 mg daily and then to 20 mg daily at time of discharge. Pt received carvedilol 12.5 mg BID, digoxin 0.0625 mg daily (digoxin level initially low that then normalized), and bidil 20-37.5 mg TIDwhich he was instructed to continue on discharge. Pt instructed to discontinue home lopressor. His home lisinopril was held in setting of AKI and to be resumed once renal function stable by PCP. Pt with net 1.6 L output and 1 lb weight loss with resolution of respiratory  symptoms. He was breathing comfortably on room air on day of discharge.   Elevated troponin in setting of probable CAD -  Pt with troponin peak of 1.78. Etiology likely demand ischemia in setting of Afib with RVR vs NSTEMI. Pt is non-verbal and it wasdifficult to discern if he was having chest pain on admission. Pt was evaluated by cardiology and started on nitroglycerin and heparin drip. He was not candidate for invasive cardiac testing, instead aggressive medical management was recommended. It is unclear if he has CAD. He received aspirin, atorvastatin, and carvedilol during hospitalizaiton. His home lisinopril was held in setting of AKI and to be resumed once renal function stable by PCP.   Hypertension - Pt with blood pressure range of 92/74 - 206/159 during hospitalization. Pt initially received nitroglycerin drip. He received bidil 20-37.5 mg TID, carvedilol 12.5 mg BID, and lasix (discharged on 20 mg daily) during hospitalization. Pt's home lisinopril 40 mg daily was held in setting of AKI and to be restarted once renal function stable by PCP. Pt's home lopressor was discontinued.   Non-oliguric AKI on probable CKD Stage 3 - Pt with Cr of 1.62 on admission that improved to 1.38 on discharge. Pt with last Cr 0.92 in May 2015. UA with proteinuria (3.1 mg). Pt with probable CKD Stage 3 with AKI in setting of cardiorenal syndrome (worsened systolic EF from May 2015). Pt's home lisinopril 40 mg daily was held in setting of AKI and to be restarted once renal function stable by PCP. Pt was continued on home calcitriol 0.25 mcg daily.   Possible Primary Hyperthyroidism - Pt with low TSH 0.252 with elevated free T4 1.84 and normal free T3 level. Pt tested negative for anti-TPO. Due to afib with RVR, there was concern for primary hyperthyroidism. Pt was scheduled for 2-day outpatient radioactive iodine uptake scan (could not be performed while hospitalized) on April 26 and 27. TSI level was pending at time of  discharge.   Insulin-dependent Type 2 DM -  Pt with last A1c 7.3 on 09/11/14. Pt at home on Lantus 0-5 U daily, SSI before meals, and glipizide. Pt's lantus and glipizide were held during hospitalization. Pt received sensitive SSI before meals with glucose range of 114-333 with no symptomatic hypoglycemia. Pt to resume prior insulin regimen and glipizide on discharge.  History of CVA with right sided hemiparesisand expressive aphasia - Pt with history of CVA with residual right sided hemiparesis, expressive aphasia, and probable dysphagia. Pt was at baseline neurological status during hospitalization. He received aspirin and lipitor during hospitalization.   COPD - Pt with no exacerbation during hospitalization.  Pt not at home on inhalers or recent PFT's on file. Pt is former smoker.   Severe Malnutrition - Pt with BMI of 15.66 with probable residual dysphagia from previous CVA. Pt was evaluated by RD who recommended magic cup TID with meals.   Hyperlipidemia -Lipid panel on 09/11/14 with low HDL 29 and normal LDL 96. Pt at home on pravastatin 40 mg daily that was changed to high intensity statin atorvastatin 40 mg daily in setting of probable CAD.   History of Hepatitis C infection -Per pt's sister had hepatitis C infection in past that did not require treatment. Pt tested positive for hepatitis C antibody. HCV VL was pending at time of discharge but appears to have been cancelled at time discharge summary being written. Pt needs outpatient HCV viral load testing.   HIV Screening - Pt tested negative for HIV during hospitalization.   DVT Prophylaxis - Pt receivd IV heparin initially that was transitioned to home eliquis with no evidence of DVT during hospitalization.  Discharge Vitals:   BP 131/61 mmHg  Pulse 55  Temp(Src) 97.7 F (36.5 C) (Oral)  Resp 17  Ht  (1.676 m)  Wt 96 lb 3.2 oz (43.636 kg)  BMI 15.53 kg/m2  SpO2 99%  Discharge Labs:  Results for orders placed or  performed during the hospital encounter of 09/11/14 (from the past 24 hour(s))  Glucose, capillary     Status: Abnormal   Collection Time: 09/16/14  4:53 PM  Result Value Ref Range   Glucose-Capillary 114 (H) 70 - 99 mg/dL   Comment 1 Notify RN    Comment 2 Document in Chart   Glucose, capillary     Status: Abnormal   Collection Time: 09/16/14  9:11 PM  Result Value Ref Range   Glucose-Capillary 162 (H) 70 - 99 mg/dL   Comment 1 Notify RN    Comment 2 Document in Chart   Basic metabolic panel     Status: Abnormal   Collection Time: 09/17/14  3:30 AM  Result Value Ref Range   Sodium 138 135 - 145 mmol/L   Potassium 3.6 3.5 - 5.1 mmol/L   Chloride 105 96 - 112 mmol/L   CO2 22 19 - 32 mmol/L   Glucose, Bld 173 (H) 70 - 99 mg/dL   BUN 29 (H) 6 - 23 mg/dL   Creatinine, Ser 5.40 (H) 0.50 - 1.35 mg/dL   Calcium 8.8 8.4 - 98.1 mg/dL   GFR calc non Af Amer 52 (L) >90 mL/min   GFR calc Af Amer 60 (L) >90 mL/min   Anion gap 11 5 - 15  Glucose, capillary     Status: Abnormal   Collection Time: 09/17/14  5:56 AM  Result Value Ref Range   Glucose-Capillary 173 (H) 70 - 99 mg/dL   Comment 1 Notify RN    Comment 2 Document in Chart   Glucose, capillary     Status: Abnormal   Collection Time: 09/17/14 11:19 AM  Result Value Ref Range   Glucose-Capillary 216 (H) 70 - 99 mg/dL   Comment 1 Notify RN     Signed: Otis Brace, MD 09/17/2014, 2:09 PM    Services Ordered on Discharge: None Equipment Ordered on Discharge: None

## 2014-09-17 NOTE — Progress Notes (Signed)
ANTICOAGULATION CONSULT NOTE - Follow Up Consult  Pharmacy Consult for apixaban Indication: atrial fibrillation  No Known Allergies  Patient Measurements: Height: 5\' 6"  (167.6 cm) Weight: 97 lb (44 kg) IBW/kg (Calculated) : 63.8  Vital Signs: Temp: 98.1 F (36.7 C) (04/13 0441) Temp Source: Oral (04/13 0441) BP: 135/84 mmHg (04/13 0441) Pulse Rate: 76 (04/13 0441)  Labs:  Recent Labs  09/15/14 0220 09/16/14 0402 09/17/14 0330  HGB 13.1  --   --   HCT 40.2  --   --   PLT 220  --   --   CREATININE 1.48* 1.56* 1.38*    Estimated Creatinine Clearance: 33.2 mL/min (by C-G formula based on Cr of 1.38).   Assessment: 63 y/o male s/p L BKA and R AKA, Afib, and hx stroke started on apixaban for stroke prevention. He was started on reduced dose apixaban 2.5 mg bid for weight and SCr >1.5. Today SCr is the lowest it has been at 1.38. If continues to stay below 1.5 would adjust to 5 mg bid. No bleeding noted.  CHA2DS2-VASc Score = 6 (CHF, HTN, DM, MI, CVA)  Goal of Therapy:  Therapeutic anticoagulation Monitor platelets by anticoagulation protocol: Yes   Plan:  - Continue apixaban 2.5 mg PO bid for now - Watch trend in SCr and if consistently below 1.5, consider increasing to 5 mg PO bid - Monitor for s/sx of bleeding  [facility], [place].D., BCPS Clinical Pharmacist Pager: [phone] 09/17/2014 10:43 AM

## 2014-09-17 NOTE — Clinical Social Work Note (Signed)
Patient will discharge to home Anticipated discharge date:09/17/14 Family notified: pt sister aware of DC and will be home when pt arrives Transportation by [facility]- called at 2:30pm  CSW signing off.  Merlyn Lot, LCSWA Clinical Social Worker [phone]

## 2014-09-17 NOTE — Progress Notes (Signed)
Subjective: Patient with expressive aphasia. Does not appear to be in pain. Became teary during exam with discussion of returning home. No signs or concern for safety issues at home.   Objective: Vital signs in last 24 hours: Filed Vitals:   09/16/14 0445 09/16/14 1347 09/16/14 2112 09/17/14 0441  BP: 125/66 113/61 115/76 135/84  Pulse: 71 68 68 76  Temp: 98.1 F (36.7 C) 97.5 F (36.4 C) 97.7 F (36.5 C) 98.1 F (36.7 C)  TempSrc: Oral Oral Oral Oral  Resp: Height:      Weight:      SpO2: 99% 99% 99% 99%   Weight change:   Intake/Output Summary (Last 24 hours) at 09/17/14 0741 Last data filed at 09/16/14 2117  Gross per 24 hour  Intake    600 ml  Output    400 ml  Net    200 ml   General: resting in bed HEENT: PERRL, EOMI, no scleral icterus, mild proptosis Cardiac: irregular rhythm, regular rate, no rubs, murmurs or gallops Pulm: poor inspiratory effort Abd: soft, nontender, nondistended, BS present Ext: Left BKA, Right AKA Neuro: right sided flaccid hemiparesis, can follow commands, expressive aphasia  Lab Results: Basic Metabolic Panel:  Recent Labs  96/04/54 0402 09/17/14 0330  NA 138 138  K 3.7 3.6  CL 104 105  CO2 23 22  GLUCOSE 185* 173*  BUN 31* 29*  CREATININE 1.56* 1.38*  CALCIUM 9.0 8.8   CBC:  Recent Labs  09/15/14 0220  WBC 8.2  HGB 13.1  HCT 40.2  MCV 89.5  PLT 220   CBG:  Recent Labs  09/15/14 1617 09/15/14 2053 09/16/14 1120 09/16/14 1653 09/16/14 2111 09/17/14 0556  GLUCAP 185* 169* 333* 114* 162* 173*   Micro Results: Recent Results (from the past 240 hour(s))  Urine culture     Status: None   Collection Time: 09/11/14  2:06 PM  Result Value Ref Range Status   Specimen Description URINE, CATHETERIZED  Final   Special Requests NONE  Final   Colony Count NO GROWTH Performed at [facility]   Final   Culture NO GROWTH Performed at [facility]   Final   Report Status 09/12/2014  FINAL  Final  MRSA PCR Screening     Status: None   Collection Time: 09/11/14  4:56 PM  Result Value Ref Range Status   MRSA by PCR NEGATIVE NEGATIVE Final    Comment:        The GeneXpert MRSA Assay (FDA approved for NASAL specimens only), is one component of a comprehensive MRSA colonization surveillance program. It is not intended to diagnose MRSA infection nor to guide or monitor treatment for MRSA infections.    Studies/Results: No results found. Medications: I have reviewed the patient's current medications. Scheduled Meds: . apixaban  2.5 mg Oral BID  . aspirin EC  81 mg Oral Daily  . atorvastatin  80 mg Oral q1800  . calcitRIOL  0.25 mcg Oral Daily  . carvedilol  12.5 mg Oral BID WC  . digoxin  0.0625 mg Oral Daily  . insulin aspart  0-9 Units Subcutaneous TID WC  . isosorbide-hydrALAZINE  1 tablet Oral TID   Continuous Infusions: . sodium chloride Stopped (09/14/14 2038)   PRN Meds:.acetaminophen **OR** acetaminophen, albuterol, metoprolol, RESOURCE THICKENUP CLEAR, RESOURCE THICKENUP CLEAR Assessment/Plan: Principal Problem:   Atrial fibrillation with rapid ventricular response Active Problems:   Hypertension   COPD (chronic obstructive pulmonary disease)  T2DM (type 2 diabetes mellitus)   PAD (peripheral artery disease)   Hemiparesis affecting right side as late effect of cerebrovascular accident   Amputee of extremity   Aphasia as late effect of cerebrovascular accident   AKI (acute kidney injury)   Cardiomyopathy, ischemic   Acute on chronic combined systolic and diastolic heart failure - secondary to accelerated hypertension and A. fib RVR   Accelerated hypertension with diastolic congestive heart failure, NYHA class 3   Non-STEMI (non-ST elevated myocardial infarction)   Acute systolic congestive heart failure   Hypertension - Normotensive for last 24 hours. D/c home lopressor in setting of reduced EF.  - continue bidil 20-37.5mg  TID,  - continue  carvedilol 12.5mg  BID   -Hold home lisinopril 40 mg daily in setting of AKI, consider restarting after resolution of AKI for CHF and CAD mortality benefits - Restart PO lasix 20mg  on d/c  Atrial Fibrillation with RVR - Pt in permanent afib with normal heart rate. Pt was at home on lopressor for rate control and eliquis for [facility] therapy. Unclear trigger, possibly due to hyperthyroidism vs AECHF vs ACS vs uncontrolled hypertension. Carvedilol 12.5mg  BID, eliquis 2.5mg  -further work-up for hyperthyroidism as below  Primary Hyperthyroidism - TSH 0.252 with free T4 1.84. Free T3 normal (2.5) and negative anti-TPO. Possible evidence of Graves disease with mild proptosis vs hyperthyroid disturbance in setting of critical illness. Pt with permanent afib with RVR on admission.  -Follow-up TSI level  - schedule RAI for outpatient evaluation; will hold methimazole until after RAI scan/uptake  - Recheck TSH 2-4 weeks to confirm elevation outside of acute setting  Acute on Chronic Systolic CHF in setting of Ischemic Cardiomyopathy - Euvolemic on exam. Pt with last 2D-echo September 2013 with EF 25-30%, now reduced to 20-25% on 4/8.  - continue carvedilol 12.5mg  BID, bidil 20-37.5 mg TID, digoxin 0.065mg  qday - hold home lisinopril with possible unresolved AKI -Pt not candidate for ICD  Non-oliguric AKI vs CKD - Cr 1.38 (1.62 on admission). Last Cr 0.92 of May 2015. Etiology initially possibly cardiorenal syndrome in setting of reduced systolic EF. Creatinine recovered (1.38 from 1.56) after holding lasix on 4/12. Weight 96lbs (from 97 on admission) so AKI unlikely 2/2 to over-diruesis. Patient put out urine in last 24 hours (.4 ml/kg/hr). Patient may have underlying CKD in setting of poorly controlled T2DM and stable but elevated creatinine -Monitor daily weights and strict I & O's  -Monitor BMP  -Hold home lisinopril 40 mg daily in setting of AKI - restart PO lasix 20mg   tmrw  Insulin-dependent Type 2 DM - CBG 173 this AM. Last A1c 7.3 on 09/11/14. Pt at home on Lantus 0-5 U daily and SSI before meals. 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 Not at baseline per admission. -Continue home aspirin 81 mg daily   Hyperlipidemia -Lipid panel with LDL 96. Pt at home on pravastatin daily.  -Continue atorvastatin 80 mg daily   History of Hepatitis C infection -Per pt's sister had hepatitis C infection in past with positive Hep C sAb. HIV negative -Awaiting HCV VL  Diet: Heart DVT Ppx: Eliquis  Code: DNR (confirmed by sister and form in chart)  Dispo: Home today  The patient does have a current PCP (No primary care provider on file.) and does need  an [facility] hospital follow-up appointment after discharge.  The patient does have transportation limitations that hinder transportation to clinic appointments. .Services Needed at time of discharge: Y = Yes, Blank = No PT:   OT:   RN:   Equipment:   Other:     LOS: 6 days   Leveda Anna, Med Student 09/17/2014, 7:41 AM

## 2014-09-17 NOTE — Clinical Documentation Improvement (Signed)
Presents with NSTEMI, Acute on Chronic Systolic Heart Failure, Cardiorenal Syndrome.   Nutritional Consult on 09/15/14 notes severe malnutrition with adjusted BMI of 18.3  Also notes Stage 1 pressure ulcer to coccyx  Recommendations include: Magic Cup TID with meals  Please assess consult and render your opinion in next progress note and include in discharge summary if applicable.  Other Condition Cannot clinically determine  Thank You, Shellee Milo ,RN Clinical Documentation Specialist:  [phone]  [facility]- Health Information Management

## 2014-09-17 NOTE — Progress Notes (Signed)
.    Subjective:  Pt seen and examined in AM. No acute events overnight. Per sister he has mild cough.    Objective: Vital signs in last 24 hours: Filed Vitals:   09/16/14 0445 09/16/14 1347 09/16/14 2112 09/17/14 0441  BP: 125/66 113/61 115/76 135/84  Pulse: 71 68 68 76  Temp: 98.1 F (36.7 C) 97.5 F (36.4 C) 97.7 F (36.5 C) 98.1 F (36.7 C)  TempSrc: Oral Oral Oral Oral  Resp: 16 17 18 17   Height:      Weight:      SpO2: 99% 99% 99% 99%   Weight change:   Intake/Output Summary (Last 24 hours) at 09/17/14 7793 Last data filed at 09/16/14 2117  Gross per 24 hour  Intake    600 ml  Output    400 ml  Net    200 ml   PHYSICAL EXAMINATION:   General: NAD HEENT: Bilateral exophthalmos Heart: Irregularly irregular rhythm with normal heart rate.  Lungs: Anterior lung sounds clear.  Abdomen: Soft, non-distended, with normal BS Extremities: Left BKA and right AKA Neuro: Right sided flaccidity, no CN deficit    Lab Results: Basic Metabolic Panel:  Recent Labs Lab 09/11/14 1938  09/16/14 0402 09/17/14 0330  NA  --   < > 138 138  K  --   < > 3.7 3.6  CL  --   < > 104 105  CO2  --   < > 23 22  GLUCOSE  --   < > 185* 173*  BUN  --   < > 31* 29*  CREATININE  --   < > 1.56* 1.38*  CALCIUM  --   < > 9.0 8.8  MG 1.7  --   --   --   PHOS 4.0  --   --   --   < > = values in this interval not displayed. Liver Function Tests:  Recent Labs Lab 09/11/14 1938  AST 27  ALT 18  ALKPHOS 81  BILITOT 1.3*  PROT 7.0  ALBUMIN 3.1*   CBC:  Recent Labs Lab 09/11/14 1329  09/14/14 0245 09/15/14 0220  WBC 12.5*  < > 12.1* 8.2  NEUTROABS 10.1*  --   --   --   HGB 15.1  < > 13.0 13.1  HCT 44.6  < > 39.0 40.2  MCV 86.8  < > 87.4 89.5  PLT 214  < > 221 220  < > = values in this interval not displayed. Cardiac Enzymes:  Recent Labs Lab 09/11/14 1938 09/11/14 2325 09/12/14 0958  TROPONINI 1.78* 1.66* 0.94*   CBG:  Recent Labs Lab 09/15/14 1617  09/15/14 2053 09/16/14 1120 09/16/14 1653 09/16/14 2111 09/17/14 0556  GLUCAP 185* 169* 333* 114* 162* 173*   Hemoglobin A1C:  Recent Labs Lab 09/11/14 1938  HGBA1C 7.3*   Fasting Lipid Panel:  Recent Labs Lab 09/11/14 1938  CHOL 145  HDL 29*  LDLCALC 96  TRIG 98  CHOLHDL 5.0   Thyroid Function Tests:  Recent Labs Lab 09/11/14 1938 09/12/14 0215 09/13/14 0117  TSH 0.252*  --   --   FREET4  --  1.84*  --   T3FREE  --   --  2.5   Urinalysis:  Recent Labs Lab 09/11/14 1406  COLORURINE AMBER*  LABSPEC 1.028  PHURINE 5.0  GLUCOSEU 100*  HGBUR MODERATE*  BILIRUBINUR SMALL*  KETONESUR 15*  PROTEINUR >300*  UROBILINOGEN 1.0  NITRITE NEGATIVE  LEUKOCYTESUR NEGATIVE  Micro Results: Recent Results (from the past 240 hour(s))  Urine culture     Status: None   Collection Time: 09/11/14  2:06 PM  Result Value Ref Range Status   Specimen Description URINE, CATHETERIZED  Final   Special Requests NONE  Final   Colony Count NO GROWTH Performed at [facility]   Final   Culture NO GROWTH Performed at [facility]   Final   Report Status 09/12/2014 FINAL  Final  MRSA PCR Screening     Status: None   Collection Time: 09/11/14  4:56 PM  Result Value Ref Range Status   MRSA by PCR NEGATIVE NEGATIVE Final    Comment:        The GeneXpert MRSA Assay (FDA approved for NASAL specimens only), is one component of a comprehensive MRSA colonization surveillance program. It is not intended to diagnose MRSA infection nor to guide or monitor treatment for MRSA infections.    Studies/Results: No results found. Medications: I have reviewed the patient's current medications. Scheduled Meds: . apixaban  2.5 mg Oral BID  . aspirin EC  81 mg Oral Daily  . atorvastatin  80 mg Oral q1800  . calcitRIOL  0.25 mcg Oral Daily  . carvedilol  12.5 mg Oral BID WC  . digoxin  0.0625 mg Oral Daily  . insulin aspart  0-9 Units Subcutaneous TID WC  .  isosorbide-hydrALAZINE  1 tablet Oral TID   Continuous Infusions: . sodium chloride Stopped (09/14/14 2038)   PRN Meds:.acetaminophen **OR** acetaminophen, albuterol, metoprolol, RESOURCE THICKENUP CLEAR, RESOURCE THICKENUP CLEAR Assessment/Plan:   Non-oliguric AKI on probable CKD Stage 3 - Cr down to 1.38 from 1.55. Urine output 400 mL. Last Cr 0.92 of May 2015. UA with proteinuria (3.1 mg). Etiology possibly due to cardiorenal syndrome in setting of reduced systolic EF.  -Monitor daily weights and strict I & O's  -Monitor BMP  -Reduce lasix 40 mg daily  to 20 mg daily on discharge which will resume tomorrow  -Hold home lisinopril 40 mg daily in setting of AKI  Hypertension - Currently normotensive. Pt was at home on lopressor 25 mg BID.  -Continue bidil 20-37.5 mg TID and carvedilol 12.5 mg BID  -Hold home lisinopril 40 mg daily in setting of AKI -Reduce lasix 40 mg daily  to 20 mg daily on discharge which will resume tomorrow   Atrial Fibrillation with RVR - Pt in permanent afib with normal heart rate. Pt was at home on lopressor for rate control and eliquis for [facility] therapy. Unclear trigger, possibly due to hyperthyroidism vs AECHF vs ACS vs uncontrolled hypertension.  -Continue carvedilol 12.5 mg BID and eliquis per pharmacy -Pt to undergo further work-up for hyperthyroidism as outpatient   Primary Hyperthyroidism - TSH 0.252 with free T4 1.84. Free T3 normal and negative anti-TPO. Pt with possible Graves disease. Pt with permanent afib with RVR on admission. -Pt to be scheduled for outpatient radioactive iodine uptake scan  -Follow-up TSI level   Acute on Chronic Systolic CHF in setting of Ischemic Cardiomyopathy - Pt is currently euvolemic. Pt with last 2D-echo September 2013 with EF 25-30%, now reduced to 20-25%. BNP elevated at 2342. Chest xray with bilateral pleural effusions. -Appreciate cardiology consult -Decrease PO lasix 40 mg daily to 20 mg daily (starting  tomm) -Continue carvedilol 12.5 mg BID, digoxin 0.065mg  daily, and bidil 20-37.5 mg to TID      -Change atorvastatin 80 mg daily to 40 mg daily   -Hold home  lisinopril 40 mg daily in setting of AKI -Pt not candidate for ICD  Elevated troponin in setting of probable CAD - Troponin peaked at 1.78. Etiology likely demand ischemia in setting of Afib with RVR vs NSTEMI. -Appreciate cardiology recommendations  -Continue home eliquis -Continue aspirin 81 mg daily, atorvastatin 80 mg daily, carvedilol 12.5 mg BID, and bidil 20-37.5 mg TID  -Hold home lisinopril 40 mg daily in setting of AKI  -Pt is not candidate for invasive cardiac testing  Insulin-dependent Type 2 DM - CBG 216 this AM. Last A1c 7.3 on 09/11/14. Pt at home on Lantus 0-5 U daily and SSI before meals. 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 -Continue home aspirin 81 mg daily   COPD - No current exacerbation. Pt not on home inhalers. No PFT's on file. -Albuterol nebulizer Q 4 Hr PRN  Hyperlipidemia -Lipid panel with LDL 96. Pt at home on pravastatin daily.  -Change atorvastatin 80 mg daily to 40 mg daily    History of Hepatitis C infection -Per pt's sister had hepatitis C infection in past that did not require treatment.  -Follow-up HCV VL -HIV Ab negative  Diet: Heart DVT Ppx: Eliquis  Code: DNR (confirmed by sister and form in chart)    Dispo: Disposition is deferred at this time, awaiting improvement of current medical problems.  Anticipated discharge in approximately 1 day(s).   The patient does have a current PCP (No primary care provider on file.) and does need an [facility] hospital follow-up appointment after discharge.  The patient does have transportation limitations that hinder transportation to clinic appointments.  .Services  Needed at time of discharge: Y = Yes, Blank = No PT:   OT:   RN:   Equipment:   Other:     LOS: 6 days   Otis Brace, MD 09/17/2014, 7:42 AM

## 2014-09-18 ENCOUNTER — Encounter (HOSPITAL_COMMUNITY): Payer: Self-pay | Admitting: Emergency Medicine

## 2014-09-18 LAB — THYROID STIMULATING IMMUNOGLOBULIN: TSI: 87 % baseline (ref <140)

## 2014-09-23 DIAGNOSIS — Z89611 Acquired absence of right leg above knee: Secondary | ICD-10-CM | POA: Diagnosis not present

## 2014-09-23 DIAGNOSIS — N19 Unspecified kidney failure: Secondary | ICD-10-CM | POA: Diagnosis not present

## 2014-09-23 DIAGNOSIS — E119 Type 2 diabetes mellitus without complications: Secondary | ICD-10-CM | POA: Diagnosis not present

## 2014-09-23 DIAGNOSIS — G8929 Other chronic pain: Secondary | ICD-10-CM | POA: Diagnosis not present

## 2014-09-23 DIAGNOSIS — K047 Periapical abscess without sinus: Secondary | ICD-10-CM | POA: Diagnosis not present

## 2014-09-30 ENCOUNTER — Encounter (HOSPITAL_COMMUNITY)
Admit: 2014-09-30 | Discharge: 2014-09-30 | Disposition: A | Discharge status: Home / Self Care | Payer: Medicare Other | Attending: Internal Medicine | Admitting: Internal Medicine

## 2014-09-30 DIAGNOSIS — I4891 Unspecified atrial fibrillation: Secondary | ICD-10-CM

## 2014-09-30 MED ORDER — SODIUM IODIDE I 131 CAPSULE
14.4000 | Freq: Once | INTRAVENOUS | Status: AC | PRN
Start: 2014-09-30 — End: 2014-09-30
  Administered 2014-09-30: 14.4 via ORAL

## 2014-10-01 ENCOUNTER — Encounter (HOSPITAL_COMMUNITY)
Admit: 2014-10-01 | Discharge: 2014-10-01 | Disposition: A | Discharge status: Home / Self Care | Payer: Medicare Other | Attending: Internal Medicine | Admitting: Internal Medicine

## 2014-10-01 DIAGNOSIS — E0590 Thyrotoxicosis, unspecified without thyrotoxic crisis or storm: Secondary | ICD-10-CM | POA: Diagnosis not present

## 2014-10-01 MED ORDER — SODIUM PERTECHNETATE TC 99M INJECTION
10.0000 | Freq: Once | INTRAVENOUS | Status: AC | PRN
  Administered 2014-10-01: 10 via INTRAVENOUS

## 2014-10-27 ENCOUNTER — Encounter (HOSPITAL_COMMUNITY): Payer: Self-pay | Admitting: General Practice

## 2014-10-27 ENCOUNTER — Inpatient Hospital Stay (HOSPITAL_COMMUNITY): Payer: Medicare Other

## 2014-10-27 ENCOUNTER — Inpatient Hospital Stay (HOSPITAL_COMMUNITY)
Admission: EM | Admit: 2014-10-27 | Discharge: 2014-10-29 | DRG: 292 | Disposition: A | Discharge status: Home / Self Care | Payer: Medicare Other | Attending: Internal Medicine | Admitting: Internal Medicine

## 2014-10-27 ENCOUNTER — Emergency Department (HOSPITAL_COMMUNITY): Payer: Medicare Other

## 2014-10-27 DIAGNOSIS — Z833 Family history of diabetes mellitus: Secondary | ICD-10-CM | POA: Diagnosis not present

## 2014-10-27 DIAGNOSIS — Z66 Do not resuscitate: Secondary | ICD-10-CM | POA: Diagnosis not present

## 2014-10-27 DIAGNOSIS — B1920 Unspecified viral hepatitis C without hepatic coma: Secondary | ICD-10-CM | POA: Diagnosis present

## 2014-10-27 DIAGNOSIS — I5021 Acute systolic (congestive) heart failure: Secondary | ICD-10-CM | POA: Diagnosis not present

## 2014-10-27 DIAGNOSIS — I129 Hypertensive chronic kidney disease with stage 1 through stage 4 chronic kidney disease, or unspecified chronic kidney disease: Secondary | ICD-10-CM | POA: Diagnosis not present

## 2014-10-27 DIAGNOSIS — Z89611 Acquired absence of right leg above knee: Secondary | ICD-10-CM | POA: Diagnosis not present

## 2014-10-27 DIAGNOSIS — E1165 Type 2 diabetes mellitus with hyperglycemia: Secondary | ICD-10-CM | POA: Diagnosis present

## 2014-10-27 DIAGNOSIS — Z79899 Other long term (current) drug therapy: Secondary | ICD-10-CM

## 2014-10-27 DIAGNOSIS — I214 Non-ST elevation (NSTEMI) myocardial infarction: Secondary | ICD-10-CM

## 2014-10-27 DIAGNOSIS — Z823 Family history of stroke: Secondary | ICD-10-CM

## 2014-10-27 DIAGNOSIS — I255 Ischemic cardiomyopathy: Secondary | ICD-10-CM | POA: Diagnosis present

## 2014-10-27 DIAGNOSIS — R64 Cachexia: Secondary | ICD-10-CM | POA: Diagnosis present

## 2014-10-27 DIAGNOSIS — Z87891 Personal history of nicotine dependence: Secondary | ICD-10-CM

## 2014-10-27 DIAGNOSIS — J80 Acute respiratory distress syndrome: Secondary | ICD-10-CM | POA: Diagnosis not present

## 2014-10-27 DIAGNOSIS — I5023 Acute on chronic systolic (congestive) heart failure: Secondary | ICD-10-CM | POA: Diagnosis not present

## 2014-10-27 DIAGNOSIS — I4891 Unspecified atrial fibrillation: Secondary | ICD-10-CM

## 2014-10-27 DIAGNOSIS — Z89512 Acquired absence of left leg below knee: Secondary | ICD-10-CM | POA: Diagnosis not present

## 2014-10-27 DIAGNOSIS — N179 Acute kidney failure, unspecified: Secondary | ICD-10-CM | POA: Diagnosis not present

## 2014-10-27 DIAGNOSIS — Z8249 Family history of ischemic heart disease and other diseases of the circulatory system: Secondary | ICD-10-CM

## 2014-10-27 DIAGNOSIS — I480 Paroxysmal atrial fibrillation: Secondary | ICD-10-CM | POA: Diagnosis not present

## 2014-10-27 DIAGNOSIS — E872 Acidosis: Secondary | ICD-10-CM | POA: Diagnosis not present

## 2014-10-27 DIAGNOSIS — E860 Dehydration: Secondary | ICD-10-CM | POA: Diagnosis not present

## 2014-10-27 DIAGNOSIS — E119 Type 2 diabetes mellitus without complications: Secondary | ICD-10-CM

## 2014-10-27 DIAGNOSIS — J9811 Atelectasis: Secondary | ICD-10-CM | POA: Diagnosis present

## 2014-10-27 DIAGNOSIS — Z8673 Personal history of transient ischemic attack (TIA), and cerebral infarction without residual deficits: Secondary | ICD-10-CM | POA: Diagnosis not present

## 2014-10-27 DIAGNOSIS — Z7982 Long term (current) use of aspirin: Secondary | ICD-10-CM

## 2014-10-27 DIAGNOSIS — Z681 Body mass index (BMI) 19 or less, adult: Secondary | ICD-10-CM | POA: Diagnosis not present

## 2014-10-27 DIAGNOSIS — F1010 Alcohol abuse, uncomplicated: Secondary | ICD-10-CM | POA: Diagnosis not present

## 2014-10-27 DIAGNOSIS — I69320 Aphasia following cerebral infarction: Secondary | ICD-10-CM

## 2014-10-27 DIAGNOSIS — I5043 Acute on chronic combined systolic (congestive) and diastolic (congestive) heart failure: Secondary | ICD-10-CM | POA: Diagnosis not present

## 2014-10-27 DIAGNOSIS — E11649 Type 2 diabetes mellitus with hypoglycemia without coma: Secondary | ICD-10-CM | POA: Diagnosis not present

## 2014-10-27 DIAGNOSIS — I739 Peripheral vascular disease, unspecified: Secondary | ICD-10-CM | POA: Diagnosis not present

## 2014-10-27 DIAGNOSIS — R7989 Other specified abnormal findings of blood chemistry: Secondary | ICD-10-CM | POA: Diagnosis not present

## 2014-10-27 DIAGNOSIS — N183 Chronic kidney disease, stage 3 (moderate): Secondary | ICD-10-CM | POA: Diagnosis not present

## 2014-10-27 DIAGNOSIS — Z794 Long term (current) use of insulin: Secondary | ICD-10-CM

## 2014-10-27 DIAGNOSIS — I252 Old myocardial infarction: Secondary | ICD-10-CM

## 2014-10-27 DIAGNOSIS — I69351 Hemiplegia and hemiparesis following cerebral infarction affecting right dominant side: Secondary | ICD-10-CM

## 2014-10-27 DIAGNOSIS — I2510 Atherosclerotic heart disease of native coronary artery without angina pectoris: Secondary | ICD-10-CM | POA: Diagnosis present

## 2014-10-27 DIAGNOSIS — S88119A Complete traumatic amputation at level between knee and ankle, unspecified lower leg, initial encounter: Secondary | ICD-10-CM | POA: Diagnosis present

## 2014-10-27 DIAGNOSIS — I5020 Unspecified systolic (congestive) heart failure: Secondary | ICD-10-CM | POA: Diagnosis present

## 2014-10-27 DIAGNOSIS — I509 Heart failure, unspecified: Secondary | ICD-10-CM | POA: Diagnosis not present

## 2014-10-27 DIAGNOSIS — R030 Elevated blood-pressure reading, without diagnosis of hypertension: Secondary | ICD-10-CM | POA: Diagnosis not present

## 2014-10-27 DIAGNOSIS — J90 Pleural effusion, not elsewhere classified: Secondary | ICD-10-CM | POA: Insufficient documentation

## 2014-10-27 DIAGNOSIS — J449 Chronic obstructive pulmonary disease, unspecified: Secondary | ICD-10-CM | POA: Diagnosis not present

## 2014-10-27 DIAGNOSIS — R0602 Shortness of breath: Secondary | ICD-10-CM | POA: Diagnosis not present

## 2014-10-27 DIAGNOSIS — Z7902 Long term (current) use of antithrombotics/antiplatelets: Secondary | ICD-10-CM

## 2014-10-27 DIAGNOSIS — IMO0002 Reserved for concepts with insufficient information to code with codable children: Secondary | ICD-10-CM | POA: Diagnosis present

## 2014-10-27 HISTORY — DX: Unspecified viral hepatitis C without hepatic coma: B19.20

## 2014-10-27 HISTORY — DX: Unspecified atrial fibrillation: I48.91

## 2014-10-27 LAB — COMPREHENSIVE METABOLIC PANEL
ALT: 84 U/L — ABNORMAL HIGH (ref 17–63)
ANION GAP: 17 — AB (ref 5–15)
AST: 96 U/L — ABNORMAL HIGH (ref 15–41)
Albumin: 3.5 g/dL (ref 3.5–5.0)
Alkaline Phosphatase: 116 U/L (ref 38–126)
BUN: 46 mg/dL — ABNORMAL HIGH (ref 6–20)
CALCIUM: 9.3 mg/dL (ref 8.9–10.3)
CO2: 16 mmol/L — ABNORMAL LOW (ref 22–32)
CREATININE: 1.8 mg/dL — AB (ref 0.61–1.24)
Chloride: 107 mmol/L (ref 101–111)
GFR calc Af Amer: 44 mL/min — ABNORMAL LOW (ref >60)
GFR, EST NON AFRICAN AMERICAN: 38 mL/min — AB (ref >60)
GLUCOSE: 103 mg/dL — AB (ref 65–99)
Potassium: 5.6 mmol/L — ABNORMAL HIGH (ref 3.5–5.1)
SODIUM: 140 mmol/L (ref 135–145)
Total Bilirubin: 1.6 mg/dL — ABNORMAL HIGH (ref 0.3–1.2)
Total Protein: 8 g/dL (ref 6.5–8.1)

## 2014-10-27 LAB — CBC WITH DIFFERENTIAL/PLATELET
BASOS ABS: 0 10*3/uL (ref 0.0–0.1)
Basophils Relative: 0 % (ref 0–1)
EOS ABS: 0.1 10*3/uL (ref 0.0–0.7)
EOS PCT: 1 % (ref 0–5)
HCT: 50.2 % (ref 39.0–52.0)
HEMOGLOBIN: 16.8 g/dL (ref 13.0–17.0)
Lymphocytes Relative: 25 % (ref 12–46)
Lymphs Abs: 2.5 10*3/uL (ref 0.7–4.0)
MCH: 29.7 pg (ref 26.0–34.0)
MCHC: 33.5 g/dL (ref 30.0–36.0)
MCV: 88.8 fL (ref 78.0–100.0)
Monocytes Absolute: 0.8 10*3/uL (ref 0.1–1.0)
Monocytes Relative: 7 % (ref 3–12)
Neutro Abs: 6.8 10*3/uL (ref 1.7–7.7)
Neutrophils Relative %: 67 % (ref 43–77)
Platelets: 241 10*3/uL (ref 150–400)
RBC: 5.65 MIL/uL (ref 4.22–5.81)
RDW: 17 % — ABNORMAL HIGH (ref 11.5–15.5)
WBC: 10.2 10*3/uL (ref 4.0–10.5)

## 2014-10-27 LAB — I-STAT ARTERIAL BLOOD GAS, ED
ACID-BASE DEFICIT: 8 mmol/L — AB (ref 0.0–2.0)
Bicarbonate: 15.4 mEq/L — ABNORMAL LOW (ref 20.0–24.0)
O2 SAT: 93 %
TCO2: 16 mmol/L (ref 0–100)
pCO2 arterial: 24.9 mmHg — ABNORMAL LOW (ref 35.0–45.0)
pH, Arterial: 7.4 (ref 7.350–7.450)
pO2, Arterial: 65 mmHg — ABNORMAL LOW (ref 80.0–100.0)

## 2014-10-27 LAB — URINALYSIS, ROUTINE W REFLEX MICROSCOPIC
GLUCOSE, UA: NEGATIVE mg/dL
Ketones, ur: 15 mg/dL — AB
Leukocytes, UA: NEGATIVE
Nitrite: NEGATIVE
PH: 5 (ref 5.0–8.0)
Specific Gravity, Urine: 1.021 (ref 1.005–1.030)
UROBILINOGEN UA: 1 mg/dL (ref 0.0–1.0)

## 2014-10-27 LAB — URINE MICROSCOPIC-ADD ON

## 2014-10-27 LAB — I-STAT TROPONIN, ED: Troponin i, poc: 0.22 ng/mL (ref 0.00–0.08)

## 2014-10-27 LAB — I-STAT CG4 LACTIC ACID, ED: LACTIC ACID, VENOUS: 2.51 mmol/L — AB (ref 0.5–2.0)

## 2014-10-27 LAB — DIGOXIN LEVEL: Digoxin Level: 0.3 ng/mL — ABNORMAL LOW (ref 0.8–2.0)

## 2014-10-27 LAB — CBG MONITORING, ED: Glucose-Capillary: 116 mg/dL — ABNORMAL HIGH (ref 65–99)

## 2014-10-27 LAB — BRAIN NATRIURETIC PEPTIDE: B Natriuretic Peptide: 2001.7 pg/mL — ABNORMAL HIGH (ref 0.0–100.0)

## 2014-10-27 LAB — T4, FREE: FREE T4: 1.2 ng/dL — AB (ref 0.61–1.12)

## 2014-10-27 LAB — TSH: TSH: 9.605 u[IU]/mL — AB (ref 0.350–4.500)

## 2014-10-27 MED ORDER — GLUCERNA SHAKE PO LIQD
237.0000 mL | Freq: Three times a day (TID) | ORAL | Status: DC
  Administered 2014-10-28 – 2014-10-29 (×4): 237 mL via ORAL

## 2014-10-27 MED ORDER — HYDRALAZINE HCL 20 MG/ML IJ SOLN: 5.0000 mg | Freq: Four times a day (QID) | INTRAMUSCULAR | Status: DC | PRN

## 2014-10-27 MED ORDER — CARVEDILOL 12.5 MG PO TABS
12.5000 mg | ORAL_TABLET | Freq: Two times a day (BID) | ORAL | Status: DC
  Administered 2014-10-28 – 2014-10-29 (×3): 12.5 mg via ORAL
  Filled 2014-10-27 (×5): qty 1

## 2014-10-27 MED ORDER — CALCITRIOL 0.25 MCG PO CAPS
0.2500 ug | ORAL_CAPSULE | Freq: Every day | ORAL | Status: DC
  Administered 2014-10-28 – 2014-10-29 (×2): 0.25 ug via ORAL
  Filled 2014-10-27 (×2): qty 1

## 2014-10-27 MED ORDER — ATORVASTATIN CALCIUM 40 MG PO TABS
40.0000 mg | ORAL_TABLET | Freq: Every day | ORAL | Status: DC
  Administered 2014-10-28 – 2014-10-29 (×2): 40 mg via ORAL
  Filled 2014-10-27 (×2): qty 1

## 2014-10-27 MED ORDER — DIGOXIN 0.0625 MG HALF TABLET
0.0625 mg | ORAL_TABLET | Freq: Every day | ORAL | Status: DC
  Administered 2014-10-28 – 2014-10-29 (×2): 0.0625 mg via ORAL
  Filled 2014-10-27 (×2): qty 1

## 2014-10-27 MED ORDER — FUROSEMIDE 10 MG/ML IJ SOLN
20.0000 mg | Freq: Two times a day (BID) | INTRAMUSCULAR | Status: DC
  Administered 2014-10-28 – 2014-10-29 (×3): 20 mg via INTRAVENOUS
  Filled 2014-10-27 (×5): qty 2

## 2014-10-27 MED ORDER — METOPROLOL TARTRATE 1 MG/ML IV SOLN
2.5000 mg | Freq: Once | INTRAVENOUS | Status: AC
  Administered 2014-10-27: 2.5 mg via INTRAVENOUS
  Filled 2014-10-27: qty 5

## 2014-10-27 MED ORDER — APIXABAN 2.5 MG PO TABS
2.5000 mg | ORAL_TABLET | Freq: Two times a day (BID) | ORAL | Status: DC
  Administered 2014-10-28 – 2014-10-29 (×4): 2.5 mg via ORAL
  Filled 2014-10-27 (×5): qty 1

## 2014-10-27 MED ORDER — IOHEXOL 300 MG/ML  SOLN: 25.0000 mL | INTRAMUSCULAR | Status: AC

## 2014-10-27 MED ORDER — SODIUM POLYSTYRENE SULFONATE 15 GM/60ML PO SUSP
15.0000 g | Freq: Once | ORAL | Status: AC
  Administered 2014-10-28: 15 g via ORAL
  Filled 2014-10-27: qty 60

## 2014-10-27 MED ORDER — INSULIN GLARGINE 100 UNIT/ML ~~LOC~~ SOLN
10.0000 [IU] | Freq: Every day | SUBCUTANEOUS | Status: DC
  Administered 2014-10-28: 10 [IU] via SUBCUTANEOUS
  Filled 2014-10-27 (×2): qty 0.1

## 2014-10-27 MED ORDER — ASPIRIN EC 81 MG PO TBEC
81.0000 mg | DELAYED_RELEASE_TABLET | Freq: Every day | ORAL | Status: DC
  Administered 2014-10-28 – 2014-10-29 (×2): 81 mg via ORAL
  Filled 2014-10-27 (×2): qty 1

## 2014-10-27 MED ORDER — FUROSEMIDE 10 MG/ML IJ SOLN
40.0000 mg | Freq: Once | INTRAMUSCULAR | Status: AC
  Administered 2014-10-27: 40 mg via INTRAVENOUS
  Filled 2014-10-27: qty 4

## 2014-10-27 MED ORDER — VITAMIN D (ERGOCALCIFEROL) 1.25 MG (50000 UNIT) PO CAPS: 50000.0000 [IU] | ORAL_CAPSULE | ORAL | Status: DC

## 2014-10-27 NOTE — ED Notes (Signed)
Notified Dr. Adriana Simas of I stat lactic results.

## 2014-10-27 NOTE — ED Notes (Signed)
Attempted report 

## 2014-10-27 NOTE — Consult Note (Signed)
Please refer to full consult note by Azalee Course, PAC. We saw the patient together in the emergency room and I agree with his findings.  [name] has an extensive history of multiple medical problems including previous stroke with residual complete expressive aphasia and right hemiparesis, severe left ventricular systolic dysfunction with chronic heart failure (presumed to be ischemic cardiomyopathy), permanent atrial fibrillation, extensive PAD status post bilateral lower extremity amputations, hypertension, diabetes mellitus, COPD and recently diagnosed hyperthyroidism (with a low radioactive Iodine uptake suggestive of possible subacute thyroiditis).  He presents today with complaints of labored breathing per his family. Objective findings include severe diastolic hypertension, marked cardiomegaly, atrial fibrillation with rapid ventricular response, S3 gallop, Cheyne-Stokes breathing, reduced breath sounds in both bases seen on radiographic imaging to represent bilateral pleural effusions and atelectasis. There is radiological evidence of congestive heart failure without full-blown pulmonary edema. His electrocardiogram shows widespread repolarization abnormalities, probably secondary to a combination of left ventricular hypertrophy and ischemia. The worsening repolarization modalities could quite well be explained by higher ventricular rates. Coronary calcifications are severe enough to be seen on simple chest x-ray. BNP levels are markedly elevated, but less so than in April. Cardiac troponin is minimally elevated, again less so than in April. His renal function is worse than his baseline.  He appears to have a moderately severe acute exacerbation of chronic systolic heart failure related to atrial fibrillation with rapid ventricular response, in turn possibly secondary to transient hyperthyroidism or other intercurrent illness. While underlying severe coronary artery disease is virtually certain, at this  point the suspicion for acute coronary atherothrombotic event as the trigger for this decompensation is low. He is not a good candidate for invasive evaluation of his coronary circulation or revascularization.  Primary focus is improved ventricular rate control, blood pressure lowering and judicious diuresis with close monitoring of renal function.  Thurmon Fair, MD, [facility] [phone] office [phone] pager

## 2014-10-27 NOTE — ED Notes (Signed)
Pt's sister and POA Bjorn Loser wishes to be updated  [phone]

## 2014-10-27 NOTE — ED Notes (Signed)
Pt brought in via [facility] from home with family member. Pt family member called EMS and reported pt was "breathing funny". Pt is nonverbal from a history of CVA and has some right sided residual. Pt is a DNR.

## 2014-10-27 NOTE — H&P (Addendum)
[name] is an 64 y.o. male.    Pcp:  [facility]   Chief Complaint: dyspnea HPI: 64 yo male with CHF (EF 20-25%), CAD s/p MI, Pafib (Chads2=5), apparently has had dyspnea since this past Friday. Decrease in appetite.  Slight cough, nonproductive.  Denies fever, chills, cp, n/v, diarrhea, brbpr.  Pt seen in ED, noted to have mild CHF, along with Afib with RVR and slight + trop.  Pt seen by cardiology who recommended rate control, and bp control, but not specific about method. We will control hr with metoprolol 2.57m iv x1, and possibly try additional dose, if not working might need amiodarone vs digoxin vs cardizem (try to avoid due to depressed EF).  (Pt's caretaker has not been giving him his carvedilol, nor his bidil. Nor his lasix at home)   Past Medical History  Diagnosis Date  . Active smoker   . Alcohol abuse, episodic   . Difficult intubation   . Dysrhythmia   . COPD (chronic obstructive pulmonary disease)   . Shortness of breath   . Diabetes mellitus   . Peripheral vascular disease   . CHF (congestive heart failure)   . Stroke     has expressive aphasia, weak on Rt. side  . Diabetes mellitus without complication   . Stroke   . MI (myocardial infarction)   . Hypertension   . Atrial fibrillation     Afib with rvr 10/27/2014  . Hepatitis C     Past Surgical History  Procedure Laterality Date  . Vascular surgery    . Leg amputation above knee      right  . Leg amputation below knee      left  . Laryngoscopy  12/27/2011    Procedure: LARYNGOSCOPY;  Surgeon: DMelida Quitter MD;  [place]  Service: E[place];  Laterality: N/A;  direct video laryngoscopy with evaluation of upper airway.reintubation.  . Below knee leg amputation Left 2008  . Above knee leg amputation Right 2008    Family History  Problem Relation Age of Onset  . Lupus Mother   . Diabetes Maternal Grandfather    Social History:  reports that he has quit smoking. His smoking use  included Cigarettes. He has a 30 pack-year smoking history. He does not have any smokeless tobacco history on file. He reports that he drinks alcohol. He reports that he uses illicit drugs (Cocaine and IV).  Allergies: No Known Allergies Medications reviewed  (Not in a hospital admission)  Results for orders placed or performed during the hospital encounter of 10/27/14 (from the past 48 hour(s))  CBC with Differential     Status: Abnormal   Collection Time: 10/27/14  5:37 PM  Result Value Ref Range   WBC 10.2 4.0 - 10.5 K/uL   RBC 5.65 4.22 - 5.81 MIL/uL   Hemoglobin 16.8 13.0 - 17.0 g/dL   HCT 50.2 39.0 - 52.0 %   MCV 88.8 78.0 - 100.0 fL   MCH 29.7 26.0 - 34.0 pg   MCHC 33.5 30.0 - 36.0 g/dL   RDW 17.0 (H) 11.5 - 15.5 %   Platelets 241 150 - 400 K/uL   Neutrophils Relative % 67 43 - 77 %   Neutro Abs 6.8 1.7 - 7.7 K/uL   Lymphocytes Relative 25 12 - 46 %   Lymphs Abs 2.5 0.7 - 4.0 K/uL   Monocytes Relative 7 3 - 12 %   Monocytes Absolute 0.8 0.1 - 1.0 K/uL  Eosinophils Relative 1 0 - 5 %   Eosinophils Absolute 0.1 0.0 - 0.7 K/uL   Basophils Relative 0 0 - 1 %   Basophils Absolute 0.0 0.0 - 0.1 K/uL  Brain natriuretic peptide     Status: Abnormal   Collection Time: 10/27/14  5:37 PM  Result Value Ref Range   B Natriuretic Peptide 2001.7 (H) 0.0 - 100.0 pg/mL  Comprehensive metabolic panel     Status: Abnormal   Collection Time: 10/27/14  5:37 PM  Result Value Ref Range   Sodium 140 135 - 145 mmol/L   Potassium 5.6 (H) 3.5 - 5.1 mmol/L    Comment: SLIGHT HEMOLYSIS   Chloride 107 101 - 111 mmol/L   CO2 16 (L) 22 - 32 mmol/L   Glucose, Bld 103 (H) 65 - 99 mg/dL   BUN 46 (H) 6 - 20 mg/dL   Creatinine, Ser 1.80 (H) 0.61 - 1.24 mg/dL   Calcium 9.3 8.9 - 10.3 mg/dL   Total Protein 8.0 6.5 - 8.1 g/dL   Albumin 3.5 3.5 - 5.0 g/dL   AST 96 (H) 15 - 41 U/L   ALT 84 (H) 17 - 63 U/L   Alkaline Phosphatase 116 38 - 126 U/L   Total Bilirubin 1.6 (H) 0.3 - 1.2 mg/dL   GFR calc  non Af Amer 38 (L) >60 mL/min   GFR calc Af Amer 44 (L) >60 mL/min    Comment: (NOTE) The eGFR has been calculated using the CKD EPI equation. This calculation has not been validated in all clinical situations. eGFR's persistently <60 mL/min signify possible Chronic Kidney Disease.    Anion gap 17 (H) 5 - 15  Digoxin level     Status: Abnormal   Collection Time: 10/27/14  5:37 PM  Result Value Ref Range   Digoxin Level 0.3 (L) 0.8 - 2.0 ng/mL  TSH     Status: Abnormal   Collection Time: 10/27/14  5:37 PM  Result Value Ref Range   TSH 9.605 (H) 0.350 - 4.500 uIU/mL  T4, free     Status: Abnormal   Collection Time: 10/27/14  5:37 PM  Result Value Ref Range   Free T4 1.20 (H) 0.61 - 1.12 ng/dL  I-stat troponin, ED     Status: Abnormal   Collection Time: 10/27/14  5:54 PM  Result Value Ref Range   Troponin i, poc 0.22 (HH) 0.00 - 0.08 ng/mL   Comment NOTIFIED PHYSICIAN    Comment 3            Comment: Due to the release kinetics of cTnI, a negative result within the first hours of the onset of symptoms does not rule out myocardial infarction with certainty. If myocardial infarction is still suspected, repeat the test at appropriate intervals.   I-Stat CG4 Lactic Acid, ED     Status: Abnormal   Collection Time: 10/27/14  5:56 PM  Result Value Ref Range   Lactic Acid, Venous 2.51 (HH) 0.5 - 2.0 mmol/L   Comment NOTIFIED PHYSICIAN   Urinalysis, Routine w reflex microscopic     Status: Abnormal   Collection Time: 10/27/14  6:25 PM  Result Value Ref Range   Color, Urine YELLOW YELLOW   APPearance CLOUDY (A) CLEAR   Specific Gravity, Urine 1.021 1.005 - 1.030   pH 5.0 5.0 - 8.0   Glucose, UA NEGATIVE NEGATIVE mg/dL   Hgb urine dipstick TRACE (A) NEGATIVE   Bilirubin Urine SMALL (A) NEGATIVE   Ketones, ur 15 (  A) NEGATIVE mg/dL   Protein, ur >300 (A) NEGATIVE mg/dL   Urobilinogen, UA 1.0 0.0 - 1.0 mg/dL   Nitrite NEGATIVE NEGATIVE   Leukocytes, UA NEGATIVE NEGATIVE  Urine  microscopic-add on     Status: Abnormal   Collection Time: 10/27/14  6:25 PM  Result Value Ref Range   WBC, UA 0-2 <3 WBC/hpf   Casts GRANULAR CAST (A) NEGATIVE   Urine-Other AMORPHOUS URATES/PHOSPHATES   CBG monitoring, ED     Status: Abnormal   Collection Time: 10/27/14  6:30 PM  Result Value Ref Range   Glucose-Capillary 116 (H) 65 - 99 mg/dL  I-Stat arterial blood gas, ED     Status: Abnormal   Collection Time: 10/27/14  6:35 PM  Result Value Ref Range   pH, Arterial 7.400 7.350 - 7.450   pCO2 arterial 24.9 (L) 35.0 - 45.0 mmHg   pO2, Arterial 65.0 (L) 80.0 - 100.0 mmHg   Bicarbonate 15.4 (L) 20.0 - 24.0 mEq/L   TCO2 16 0 - 100 mmol/L   O2 Saturation 93.0 %   Acid-base deficit 8.0 (H) 0.0 - 2.0 mmol/L   Collection site RADIAL, ALLEN'S TEST ACCEPTABLE    Drawn by Operator    Sample type ARTERIAL    Dg Chest 2 View  10/27/2014   CLINICAL DATA:  Shortness of breath for 3 days. History of atrial fibrillation, congestive heart failure, COPD, hypertension and diabetes. Initial encounter.  EXAM: CHEST  2 VIEW  COMPARISON:  10/15/2013 and 10/14/2013 radiographs.  FINDINGS: There are new moderate size dependent pleural effusions bilaterally with associated bibasilar atelectasis. The heart is enlarged. Coronary artery calcifications noted. There is vascular congestion without overt pulmonary edema or confluent airspace opacity. The bones appear unchanged.  IMPRESSION: 1. New pleural effusions with associated bibasilar atelectasis. 2. Cardiomegaly or pericardial effusion with mild vascular congestion.   Electronically Signed   By: Richardean Sale M.D.   On: 10/27/2014 18:10    Review of Systems  Constitutional: Negative.   HENT: Negative.   Eyes: Negative.   Respiratory: Positive for cough and shortness of breath. Negative for hemoptysis, sputum production and wheezing.   Cardiovascular: Negative for chest pain, palpitations, orthopnea, claudication, leg swelling and PND.   Gastrointestinal: Negative.   Genitourinary: Negative.   Musculoskeletal: Negative.   Skin: Negative.   Neurological: Negative.   Endo/Heme/Allergies: Negative.   Psychiatric/Behavioral: Negative.     Blood pressure 158/118, pulse 61, temperature 98.4 F (36.9 C), temperature source Oral, resp. rate 27, SpO2 86 %. Physical Exam  Constitutional: He appears well-developed and well-nourished.  HENT:  Head: Normocephalic and atraumatic.  Mouth/Throat: No oropharyngeal exudate.  Eyes: Conjunctivae and EOM are normal. Pupils are equal, round, and reactive to light. No scleral icterus.  Neck: Normal range of motion. Neck supple. No JVD present. No tracheal deviation present. No thyromegaly present.  Cardiovascular: Exam reveals no gallop and no friction rub.   No murmur heard. Tachy s1, s2  Respiratory: No respiratory distress. He has no wheezes. He has no rales. He exhibits no tenderness.  Decrease bs bil bases  GI: Soft. Bowel sounds are normal. He exhibits no distension. There is no tenderness. There is no rebound and no guarding.  Musculoskeletal: Normal range of motion. He exhibits no edema or tenderness.  Lymphadenopathy:    He has no cervical adenopathy.  Neurological: He displays normal reflexes. No cranial nerve deficit. He exhibits normal muscle tone. Coordination normal.  Skin: Skin is warm and dry. No rash noted.  No erythema. No pallor.  Psychiatric: He has a normal mood and affect. His behavior is normal. Judgment and thought content normal.     Assessment/Plan Afib with rvr,(CHADS2=5)  Metoprolol 2.67m iv x1 Cont digoxin, restart carvedilol Trop i q6h x3 Cont eliquis   CHF (EF 20-25%) Check tsh, check echo Pt's caretaker has not been giving him his carvedilol, nor his bidil. Nor his lasix at home Lasix 280miv bid We will cont with digoxin.  Strict i and o, check daily weight No nitro due to fact that ? Making him feel poorly  (ie bidil, didn't make him feel well  per his caretaker)  Abormal lft Check ct scan abd /pelvis  Pleural effusion Check CT scan chest  Acidosis (low bicarb) Check cmp in am  Dental issues Cont keflex 50034mo bid?  Hypertension uncontrolled Hydralazine 5mg74m q6h prn sbp >140  Dm2  fsbs ac and qhs, iss  CODE STATUS  DNR  DVT prophylaxis: eliquis    Navid Lenzen,Jani Gravel3/2016, 9:11 PM

## 2014-10-27 NOTE — ED Notes (Signed)
Patient transported to CT 

## 2014-10-27 NOTE — Consult Note (Signed)
Patient ID: [name] MRN: [id number], DOB/AGE: 01-31-1951   Admit date: 10/27/2014   Primary Physician: Karna Dupes Primary Cardiologist: Dr. Harlin Heys. Excell Seltzer  Pt. Profile:  64 year old African-American male with history of remote CVA resulting in complete expressive aphasia and right-sided weakness, PAD s/p L BKA and R AKA, history of permanent atrial fibrillation, tobacco abuse, EtOH abuse, COPD, hypertension, DM, history of chronic systolic heart failure with baseline EF 25% present with SOB  Problem List  Past Medical History  Diagnosis Date  . Active smoker   . Alcohol abuse, episodic   . Difficult intubation   . Dysrhythmia   . COPD (chronic obstructive pulmonary disease)   . Shortness of breath   . Diabetes mellitus   . Peripheral vascular disease   . CHF (congestive heart failure)   . Stroke     has expressive aphasia, weak on Rt. side  . Diabetes mellitus without complication   . Stroke   . MI (myocardial infarction)   . Hypertension     Past Surgical History  Procedure Laterality Date  . Vascular surgery    . Leg amputation above knee      right  . Leg amputation below knee      left  . Laryngoscopy  12/27/2011    Procedure: LARYNGOSCOPY;  Surgeon: Christia Reading, MD;  Location: [facility] OR;  Service: ENT;  Laterality: N/A;  direct video laryngoscopy with evaluation of upper airway.reintubation.  . Below knee leg amputation Left 2008  . Above knee leg amputation Right 2008     Allergies  No Known Allergies  HPI  The patient is a 64 year old African-American male with history of remote CVA resulting in complete expressive aphasia and right-sided weakness, PAD s/p L BKA and R AKA, history of permanent atrial fibrillation, tobacco abuse, EtOH abuse, COPD, hypertension, DM, history of chronic systolic heart failure with baseline EF 25%. He was originally seen by Dr. Excell Seltzer in 2013, however failed to follow-up until he was seen again by Dr.  Royann Shivers in Jan 2015. He was placed on Eliquis 5 mg twice a day. He also had digoxin and beta blocker for rate control. Strangely, according to medical record, he is on both carvedilol and the metoprolol. Unfortunately he was lost to follow-up again after his cardiology visit until Apr 2016. Of note, patient was recently diagnosed with hyperthyroidism. Her TSH one month ago was 0.252. Her last TSH and free T4 obtained on 5/23 showed TSH was 9.6 and a free T4 was 1.2. Due to his complete expressive aphasia, unfortunately we were unable to obtain his history and progress in the last 1 year.  He presented [facility] on 10/27/2014 via EMS as family member reported the patient was breathing funny. Sister denies any recent fever or sick contact. He had a dry cough and malodorous urine. However urinalysis did not show any obvious UTI. He has significant proteinuria on urinalysis. Chest x-ray showed pleural effusion and atelectasis with some mild vascular congestion. He was given a dose of IV Lasix. His creatinine was 1.8 which has increased from the previous level. EKG showed atrial fibrillation, EKG also demonstrated LVH and T-wave inversion in inferolateral leads which is new when compared to the previous EKG in May 2015. Per his last office note, patient is suspected to have significant CAD, however he did not complain of anginal symptom during the previous visit and he was deemed a poor candidate for invasive therapy. While in the ED, patient's  heart rate persistently in the 120s. Initial troponin was elevated at 0.2. Cardiology has been consulted for elevated troponin in the setting of dyspnea and tachycardia.  Home Medications  Prior to Admission medications   Medication Sig Start Date End Date Taking? Authorizing Provider  apixaban (ELIQUIS) 2.5 MG TABS tablet Take 1 tablet (2.5 mg total) by mouth 2 (two) times daily. 09/17/14   Otis Brace, MD  apixaban (ELIQUIS) 5 MG TABS tablet Take 1 tablet  (5 mg total) by mouth 2 (two) times daily. 07/31/13   Mihai Croitoru, MD  aspirin EC 81 MG tablet Take 81 mg by mouth daily.    Historical Provider, MD  atorvastatin (LIPITOR) 40 MG tablet Take 1 tablet (40 mg total) by mouth daily. 09/17/14   Otis Brace, MD  calcitRIOL (ROCALTROL) 0.25 MCG capsule Take 0.25 mcg by mouth daily.    Historical Provider, MD  carvedilol (COREG) 12.5 MG tablet Take 1 tablet (12.5 mg total) by mouth 2 (two) times daily with a meal. 09/17/14   Otis Brace, MD  digoxin (LANOXIN) 0.125 MG tablet Take 0.125 mg by mouth every morning.    Historical Provider, MD  digoxin (LANOXIN) 0.125 MG tablet Take 0.5 tablets (0.0625 mg total) by mouth daily. 09/17/14   Otis Brace, MD  feeding supplement, GLUCERNA SHAKE, (GLUCERNA SHAKE) LIQD Take 237 mLs by mouth 3 (three) times daily between meals. 10/17/13   Joseph Art, DO  glipiZIDE (GLUCOTROL) 5 MG tablet Take 5 mg by mouth 2 (two) times daily before a meal.    Historical Provider, MD  insulin aspart (NOVOLOG) 100 UNIT/ML injection CBG 70 - 120: 0 units CBG 121 - 150: 0 units CBG 151 - 200: 0 units CBG 201 - 250: 2 units CBG 251 - 300: 3 units CBG 301 - 350: 4 units CBG 351 - 400: 5 units QHS 10/17/13   Jessica U Vann, DO  insulin aspart (NOVOLOG) 100 UNIT/ML injection CBG 70 - 120: 0 units CBG 121 - 150: 1 unit CBG 151 - 200: 2 units CBG 201 - 250: 3 units CBG 251 - 300: 5 units CBG 301 - 350: 7 units CBG 351 - 400: 9 units TID WITH MEALS 10/17/13   Jessica U Vann, DO  insulin aspart (NOVOLOG) 100 UNIT/ML injection Inject 0-9 Units into the skin 3 (three) times daily before meals. If 70-120 give 0units, if 121-150 give 1 units, 151-200 give 2 units, 201-250 give 3 units, 251-300 give 5 units, 301-350 give 7 units, 351-400 give 9 units    Historical Provider, MD  insulin glargine (LANTUS) 100 UNIT/ML injection Inject 0.1 mLs (10 Units total) into the skin at bedtime. 10/17/13   Joseph Art, DO  insulin glargine  (LANTUS) 100 UNIT/ML injection Inject 0-5 Units into the skin at bedtime. If BS is 70-200 give 0units, 201-250 give 2 units, 251-300 give 3 units, 301-350 give 4 units, 351-400 give 5 units.    Historical Provider, MD  isosorbide-hydrALAZINE (BIDIL) 20-37.5 MG per tablet Take 1 tablet by mouth 3 (three) times daily. 09/17/14   Otis Brace, MD  lisinopril (PRINIVIL,ZESTRIL) 20 MG tablet Take 20 mg by mouth every morning.    Historical Provider, MD  Maltodextrin-Xanthan Gum (RESOURCE THICKENUP CLEAR) POWD As needed 10/17/13   Joseph Art, DO  metoprolol tartrate (LOPRESSOR) 25 MG tablet Take 25 mg by mouth 2 (two) times daily.    Historical Provider, MD  pravastatin (PRAVACHOL) 20 MG tablet Take 20 mg by mouth every  morning.    Historical Provider, MD  Vitamin D, Ergocalciferol, (DRISDOL) 50000 UNITS CAPS capsule Take 50,000 Units by mouth every 7 (seven) days.    Historical Provider, MD    Family History  No family history on file.  Social History  History   Social History  . Marital Status: Single    Spouse Name: N/A  . Number of Children: N/A  . Years of Education: N/A   Occupational History  . disabled    Social History Main Topics  . Smoking status: Former Smoker -- 1.00 packs/day for 30 years    Types: Cigarettes  . Smokeless tobacco: Not on file  . Alcohol Use: 0.0 oz/week    0 Standard drinks or equivalent per week     Comment: Quit 8 years ago  . Drug Use: Yes    Special: Cocaine, IV     Comment: Quit 8 years ago  . Sexual Activity: Not Currently   Other Topics Concern  . Not on file   Social History Narrative   ** Merged History Encounter **       ** Data from: 10/14/13 Enc Dept: [facility]       ** Data from: 09/11/14 Enc Dept: [facility]   Patient lives with sister (POA)     Review of Systems ROS unable to be obtained, family not at bedside, patient has complete expressive aphasia  Physical Exam  Blood pressure 141/110, pulse 47,  temperature 98.4 F (36.9 C), temperature source Oral, resp. rate 26, SpO2 97 %.  General: NAD, thin frail Neuro: Alert. L BKA and R AKA HEENT: Normal  Neck: Supple without bruits or JVD. Lungs:  Decreased breath sound bilaterally, Cheyne-Stokes breathing Heart: tachycardic. Difficult to assess murmur with tachycardia Abdomen: Soft, non-distended, BS + x 4.  Extremities: No clubbing, cyanosis or edema.  Labs  Troponin [facility] Test)  Recent Labs  10/27/14 1754  TROPIPOC 0.22*   No results for input(s): CKTOTAL, CKMB, TROPONINI in the last 72 hours. Lab Results  Component Value Date   WBC 10.2 10/27/2014   HGB 16.8 10/27/2014   HCT 50.2 10/27/2014   MCV 88.8 10/27/2014   PLT 241 10/27/2014    Recent Labs Lab 10/27/14 1737  NA 140  K 5.6*  CL 107  CO2 16*  BUN 46*  CREATININE 1.80*  CALCIUM 9.3  PROT 8.0  BILITOT 1.6*  ALKPHOS 116  ALT 84*  AST 96*  GLUCOSE 103*   Lab Results  Component Value Date   CHOL 145 09/11/2014   HDL 29* 09/11/2014   LDLCALC 96 09/11/2014   TRIG 98 09/11/2014   No results found for: DDIMER   Radiology/Studies  Dg Chest 2 View  10/27/2014   CLINICAL DATA:  Shortness of breath for 3 days. History of atrial fibrillation, congestive heart failure, COPD, hypertension and diabetes. Initial encounter.  EXAM: CHEST  2 VIEW  COMPARISON:  10/15/2013 and 10/14/2013 radiographs.  FINDINGS: There are new moderate size dependent pleural effusions bilaterally with associated bibasilar atelectasis. The heart is enlarged. Coronary artery calcifications noted. There is vascular congestion without overt pulmonary edema or confluent airspace opacity. The bones appear unchanged.  IMPRESSION: 1. New pleural effusions with associated bibasilar atelectasis. 2. Cardiomegaly or pericardial effusion with mild vascular congestion.   Electronically Signed   By: Carey Bullocks M.D.   On: 10/27/2014 18:10   Nm Thyroid Sng Uptake W/imaging  10/01/2014    CLINICAL DATA:  Hyperthyroidism.  TSH equal  0.3  EXAM: THYROID SCAN AND UPTAKE - 24 HOURS  TECHNIQUE: Following the per oral administration of I-131 sodium iodide, the patient returned at 24 hours and uptake measurements were acquired with the uptake probe centered on the neck. Thyroid imaging was performed following the intravenous administration of the Tc-83m Pertechnetate.  RADIOPHARMACEUTICALS:  14.4 microCuries I-131 sodium iodide orally and 10.0 mCi Technetium-44m pertechnetate  COMPARISON:  None  FINDINGS: There is low uptake within the thyroid gland. The thyroid gland is barely visible.  24 hour I 131 uptake = 1.7% (normal 10-30%)  IMPRESSION: Decreased thyroid uptake by imaging and probe detection. Findings suggest subacute thyroiditis versus exogenous iodine.   Electronically Signed   By: Genevive Bi M.D.   On: 10/01/2014 16:10    ECG  A-fib with HR 120, TWI in inferolateral leads  Echocardiogram 09/12/2014  LV EF: 20% -  25%  ------------------------------------------------------------------- Indications:   Dyspnea 786.09.  ------------------------------------------------------------------- History:  PMH:  Atrial fibrillation. Congestive heart failure. Stroke. Chronic obstructive pulmonary disease. Risk factors: Hypertension. Diabetes mellitus.  ------------------------------------------------------------------- Study Conclusions  - Left ventricle: The cavity size was normal. Wall thickness was increased in a pattern of mild LVH. Systolic function was severely reduced. The estimated ejection fraction was in the range of 20% to 25%. Diffuse hypokinesis. - Left atrium: The atrium was moderately dilated. - Right atrium: The atrium was mildly dilated. - Atrial septum: The septum bowed from left to right, consistent with increased left atrial pressure. - Pulmonary arteries: Systolic pressure was mildly increased. PA peak pressure: 35 mm Hg (S). -  Pericardium, extracardiac: There was a left pleural effusion.  Impressions:  - Definity used; Severe global reduction in LV function; spontaneous contrast in LV consistent with low flow state; biatrial enlargement; mild TR; mildly elevated pulmonary pressure; left pleural effusion.     ASSESSMENT AND PLAN  1. Acute on chronic systolic heart failure  - restart home bidil  - given  IV lasix in ED, will start with  BID IV lasix  2. Elevated trop  - EKG TWI in inferolateral leads, likely has underlying coronary dx worsened by tachycardia  - maybe related to tachycardia or HF, poor candidate for invasive therapy  - hold digoxin given worsening renal function  3. Permanent atrial fibrillation with RVR  - continue BB, on both coreg and metoprolol, should hold metoprolol and continue coreg only  - hopefully with IV lasix, HR will improve, however may need further rate control medication adjustment  4. 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 PAD s/p L BKA and R AKA 6. Tobacco abuse 7. EtOH abuse 8. COPD 9. Hypertension 10. DM  11. Proteinuria  Signed, Azalee Course, PA-C 10/27/2014, 8:25 PM

## 2014-10-27 NOTE — ED Provider Notes (Signed)
CSN: [id number]     Arrival date & time 10/27/14  1647 History   First MD Initiated Contact with Patient 10/27/14 1653     No chief complaint on file.    (Consider location/radiation/quality/duration/timing/severity/associated sxs/prior Treatment) HPI Comments: KIYAAN HAQ is a 64 y.o. 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 She reports that the patient has been "breathing funny" which she first noticed on Friday, stating he breathes faster than normal. She noticed that he also has had a dry cough, and malodorous urine with a decreased appetite. Patient is able to indicate that he has some suprapubic abdominal pain and dysuria. Sister denies any recent fevers, or sick contacts. Denies any diarrhea, melena, hematochezia, or peripheral edema. Patient is able to deny any chest pain. Sister states he was recently diagnosed with hyperthyroidism which she thought might be the cause of his breathing issues. She states he's compliant on all meds, except she does state he was given keflex when he left the hospital on 4/19, but she was only giving it to him once daily, so he is still taking this medication. Additionally she states that he was on Bidil but stopped it 2 wks ago because he seemed confused when he was on it. She states he's currently at his baseline mental status.   LEVEL 5 CAVEAT DUE TO PT BEING NONVERBAL.  Patient is a 64 y.o. male presenting with shortness of breath. The history is provided by a caregiver and the EMS personnel. The history is limited by the condition of the patient (pt nonverbal at baseline). No language interpreter was used.  Shortness of Breath Severity:  Unable to specify Onset quality:  Gradual Duration:  3 days Timing:   Constant Progression:  Unchanged Chronicity:  Recurrent Relieved by:  None tried Worsened by:  Nothing tried Ineffective treatments:  None tried Associated symptoms: abdominal pain (suprapubic) and cough   Associated symptoms: no chest pain, no fever, no sputum production and no vomiting   Risk factors: prolonged immobilization (immobile at baseline)     Past Medical History  Diagnosis Date  . Active smoker   . Alcohol abuse, episodic   . Difficult intubation   . Dysrhythmia   . COPD (chronic obstructive pulmonary disease)   . Shortness of breath   . Diabetes mellitus   . Peripheral vascular disease   . CHF (congestive heart failure)   . Stroke     has expressive aphasia, weak on Rt. side  . Diabetes mellitus without complication   . Stroke   . MI (myocardial infarction)   . Hypertension    Past Surgical History  Procedure Laterality Date  . Vascular surgery    . Leg amputation above knee      right  . Leg amputation below knee      left  . Laryngoscopy  12/27/2011    Procedure: LARYNGOSCOPY;  Surgeon: Christia Reading, MD;  Location: [facility] OR;  Service: ENT;  Laterality: N/A;  direct video laryngoscopy with evaluation of upper airway.reintubation.  . Below knee leg amputation Left 2008  . Above knee leg amputation Right 2008   No family history on file. History  Substance Use Topics  . Smoking status: Former Smoker -- 1.00 packs/day  for 30 years    Types: Cigarettes  . Smokeless tobacco: Not on file  . Alcohol Use: 0.0 oz/week    0 Standard drinks or equivalent per week     Comment: Quit 8 years ago    Review of Systems  Unable to perform ROS: Patient nonverbal  Constitutional: Negative for fever.  Respiratory: Positive for cough and shortness of breath. Negative for sputum production.   Cardiovascular: Negative for chest pain and leg swelling.  Gastrointestinal: Positive for abdominal pain (suprapubic). Negative for vomiting.  Genitourinary: Positive for dysuria.        +Malodorous urine  Allergic/Immunologic: Positive for immunocompromised state.  Psychiatric/Behavioral: Negative for confusion.   10 Systems reviewed and are negative for acute change except as noted in the HPI.    Allergies  Review of patient's allergies indicates no known allergies.  Home Medications   Prior to Admission medications   Medication Sig Start Date End Date Taking? Authorizing Provider  apixaban (ELIQUIS) 2.5 MG TABS tablet Take 1 tablet (2.5 mg total) by mouth 2 (two) times daily. 09/17/14   Otis Brace, MD  apixaban (ELIQUIS) 5 MG TABS tablet Take 1 tablet (5 mg total) by mouth 2 (two) times daily. 07/31/13   Mihai Croitoru, MD  aspirin EC 81 MG tablet Take 81 mg by mouth daily.    Historical Provider, MD  atorvastatin (LIPITOR) 40 MG tablet Take 1 tablet (40 mg total) by mouth daily. 09/17/14   Otis Brace, MD  calcitRIOL (ROCALTROL) 0.25 MCG capsule Take 0.25 mcg by mouth daily.    Historical Provider, MD  carvedilol (COREG) 12.5 MG tablet Take 1 tablet (12.5 mg total) by mouth 2 (two) times daily with a meal. 09/17/14   Otis Brace, MD  digoxin (LANOXIN) 0.125 MG tablet Take 0.125 mg by mouth every morning.    Historical Provider, MD  digoxin (LANOXIN) 0.125 MG tablet Take 0.5 tablets (0.0625 mg total) by mouth daily. 09/17/14   Otis Brace, MD  feeding supplement, GLUCERNA SHAKE, (GLUCERNA SHAKE) LIQD Take 237 mLs by mouth 3 (three) times daily between meals. 10/17/13   Joseph Art, DO  glipiZIDE (GLUCOTROL) 5 MG tablet Take 5 mg by mouth 2 (two) times daily before a meal.    Historical Provider, MD  insulin aspart (NOVOLOG) 100 UNIT/ML injection CBG 70 - 120: 0 units CBG 121 - 150: 0 units CBG 151 - 200: 0 units CBG 201 - 250: 2 units CBG 251 - 300: 3 units CBG 301 - 350: 4 units CBG 351 - 400: 5 units QHS 10/17/13   Jessica U Vann, DO  insulin aspart (NOVOLOG) 100 UNIT/ML injection CBG 70 - 120: 0 units CBG 121 - 150: 1 unit CBG 151 - 200: 2  units CBG 201 - 250: 3 units CBG 251 - 300: 5 units CBG 301 - 350: 7 units CBG 351 - 400: 9 units TID WITH MEALS 10/17/13   Jessica U Vann, DO  insulin aspart (NOVOLOG) 100 UNIT/ML injection Inject 0-9 Units into the skin 3 (three) times daily before meals. If 70-120 give 0units, if 121-150 give 1 units, 151-200 give 2 units, 201-250 give 3 units, 251-300 give 5 units, 301-350 give 7 units, 351-400 give 9 units    Historical Provider, MD  insulin glargine (LANTUS) 100 UNIT/ML injection Inject 0.1 mLs (10 Units total) into the skin at bedtime. 10/17/13   Joseph Art, DO  insulin glargine (LANTUS) 100 UNIT/ML injection Inject 0-5 Units into the skin at  bedtime. If BS is 70-200 give 0units, 201-250 give 2 units, 251-300 give 3 units, 301-350 give 4 units, 351-400 give 5 units.    Historical Provider, MD  isosorbide-hydrALAZINE (BIDIL) 20-37.5 MG per tablet Take 1 tablet by mouth 3 (three) times daily. 09/17/14   Otis Brace, MD  lisinopril (PRINIVIL,ZESTRIL) 20 MG tablet Take 20 mg by mouth every morning.    Historical Provider, MD  Maltodextrin-Xanthan Gum (RESOURCE THICKENUP CLEAR) POWD As needed 10/17/13   Joseph Art, DO  metoprolol tartrate (LOPRESSOR) 25 MG tablet Take 25 mg by mouth 2 (two) times daily.    Historical Provider, MD  pravastatin (PRAVACHOL) 20 MG tablet Take 20 mg by mouth every morning.    Historical Provider, MD  Vitamin D, Ergocalciferol, (DRISDOL) 50000 UNITS CAPS capsule Take 50,000 Units by mouth every 7 (seven) days.    Historical Provider, MD   BP 157/107 mmHg  Pulse 56  Temp(Src) 98.4 F (36.9 C) (Oral)  Resp 31  SpO2 93%   Physical Exam  Constitutional: He appears cachectic.  Non-toxic appearance.  Afebrile, nontoxic, thin and frail, appears older than stated age, cachetic. Tachycardic and mildly hypertensive  HENT:  Head: Normocephalic and atraumatic.  Mouth/Throat: Oropharynx is clear and moist. Mucous membranes are dry.  Mildly dry mucous membranes   Eyes: Conjunctivae and EOM are normal. Right eye exhibits no discharge. Left eye exhibits no discharge.  Neck: Normal range of motion. Neck supple. No JVD present.  No JVD  Cardiovascular: Normal heart sounds and intact distal pulses.  An irregularly irregular rhythm present. Tachycardia present.  Exam reveals no gallop and no friction rub.   No murmur heard. Tachycardic, HR in the 110s on exam, irregularly irregular rhythm, nl s1/s2, no m/r/g, distal pulses intact (not including those not present due to leg amputations). No lower leg edema in residual stumps  Pulmonary/Chest: Accessory muscle usage present. Tachypnea noted. He is in respiratory distress. He has no decreased breath sounds. He has no wheezes. He has rhonchi (clears with cough) in the right middle field. He has no rales. He exhibits retraction (intercostal, when tachypneic).  Tachypneic intermittently during exam with notable intercostal retractions, rhonchi in RMF/RUF which clears with cough after which time his lung sounds were CTAB in all lung fields, no w/r/r, no hypoxia, SpO2 93-98% on RA  Abdominal: Soft. Normal appearance and bowel sounds are normal. He exhibits no distension and no pulsatile midline mass. There is no tenderness. There is no rigidity, no rebound, no guarding, no tenderness at McBurney's point and negative Murphy's sign.  Soft, NTND, sunken in, no pulsatile midline mass, +BS throughout, no r/g/r, neg murphy's, neg mcburney's  Musculoskeletal: Normal range of motion.  L BKA, R AKA No peripheral edema noted in lower extremity residual stumps  Neurological: He is alert.  Alert but nonverbal at baseline  Skin: Skin is warm, dry and intact. No rash noted.  Psychiatric: He has a normal mood and affect.  Nursing note and vitals reviewed.   ED Course  Procedures (including critical care time) Labs Review Labs Reviewed  CBC WITH DIFFERENTIAL/PLATELET - Abnormal; Notable for the following:    RDW 17.0 (*)     All other components within normal limits  BRAIN NATRIURETIC PEPTIDE - Abnormal; Notable for the following:    B Natriuretic Peptide 2001.7 (*)    All other components within normal limits  COMPREHENSIVE METABOLIC PANEL - Abnormal; Notable for the following:    Potassium 5.6 (*)    CO2  16 (*)    Glucose, Bld 103 (*)    BUN 46 (*)    Creatinine, Ser 1.80 (*)    AST 96 (*)    ALT 84 (*)    Total Bilirubin 1.6 (*)    GFR calc non Af Amer 38 (*)    GFR calc Af Amer 44 (*)    Anion gap 17 (*)    All other components within normal limits  URINALYSIS, ROUTINE W REFLEX MICROSCOPIC - Abnormal; Notable for the following:    APPearance CLOUDY (*)    Hgb urine dipstick TRACE (*)    Bilirubin Urine SMALL (*)    Ketones, ur 15 (*)    Protein, ur >300 (*)    All other components within normal limits  DIGOXIN LEVEL - Abnormal; Notable for the following:    Digoxin Level 0.3 (*)    All other components within normal limits  TSH - Abnormal; Notable for the following:    TSH 9.605 (*)    All other components within normal limits  T4, FREE - Abnormal; Notable for the following:    Free T4 1.20 (*)    All other components within normal limits  URINE MICROSCOPIC-ADD ON - Abnormal; Notable for the following:    Casts GRANULAR CAST (*)    All other components within normal limits  I-STAT TROPOININ, ED - Abnormal; Notable for the following:    Troponin i, poc 0.22 (*)    All other components within normal limits  I-STAT ARTERIAL BLOOD GAS, ED - Abnormal; Notable for the following:    pCO2 arterial 24.9 (*)    pO2, Arterial 65.0 (*)    Bicarbonate 15.4 (*)    Acid-base deficit 8.0 (*)    All other components within normal limits  I-STAT CG4 LACTIC ACID, ED - Abnormal; Notable for the following:    Lactic Acid, Venous 2.51 (*)    All other components within normal limits  CBG MONITORING, ED - Abnormal; Notable for the following:    Glucose-Capillary 116 (*)    All other components within  normal limits    Imaging Review Dg Chest 2 View  10/27/2014   CLINICAL DATA:  Shortness of breath for 3 days. History of atrial fibrillation, congestive heart failure, COPD, hypertension and diabetes. Initial encounter.  EXAM: CHEST  2 VIEW  COMPARISON:  10/15/2013 and 10/14/2013 radiographs.  FINDINGS: There are new moderate size dependent pleural effusions bilaterally with associated bibasilar atelectasis. The heart is enlarged. Coronary artery calcifications noted. There is vascular congestion without overt pulmonary edema or confluent airspace opacity. The bones appear unchanged.  IMPRESSION: 1. New pleural effusions with associated bibasilar atelectasis. 2. Cardiomegaly or pericardial effusion with mild vascular congestion.   Electronically Signed   By: Carey Bullocks M.D.   On: 10/27/2014 18:10     EKG Interpretation   Date/Time:  Monday Oct 27 2014 17:05:02 EDT Ventricular Rate:  120 PR Interval:    QRS Duration: 107 QT Interval:  352 QTC Calculation: 497 R Axis:   78 Text Interpretation:  Atrial fibrillation Probable left ventricular  hypertrophy Repol abnrm suggests ischemia, diffuse leads Anterior ST  elevation, probably due to LVH Baseline wander in lead(s) V2 V3 Confirmed  by Adriana Simas  MD, BRIAN ([id number]) on 10/27/2014 5:29:46 PM Also confirmed by Adriana Simas   MD, BRIAN ([id number])  on 10/27/2014 5:30:06 PM      MDM   Final diagnoses:  Acute on chronic combined systolic and diastolic heart failure -  secondary to accelerated hypertension and A. fib RVR  Non-STEMI (non-ST elevated myocardial infarction)    64 y.o. male who is nonverbal, no family at bedside for initial evaluation. EMS reporting that family called them out for pt "breathing funny". On exam, he will intermittently become tachypneic and have intercostal retractions, but then seems to calm down and go back to easy breathing. Lung exam clear overall, although RUF with some ?rhonchi that cleared with cough. Tachycardic to the  110s, which seems chronic for pt, and SpO2 varies from mid-to-upper 90s. Thin and frail appearing. No peripheral edema in his residual stumps (pt s/p L BKA and R AKA). Due to pt being nonverbal and no family at bedside, difficult to fully assess what may be going on with this pt. Will order screening labs, EKG, CXR, and U/A. Doubt need for D-dimer at this time, but could consider this if pt has worsening hypoxia or oxygen requirement. Will reassess shortly.   7:00 PM ABG showing no pH changes, mildly low O2, will place on oxygen now. CBG 116. U/A appears dehydrated but without evidence of UTI. Lactic acid 2.51, Troponin 0.22 (improved from last month). CBC w/diff unremarkable. BNP 2001.7 with CXR showing pleural effusions and atelectasis with some mild vascular congestion, given lasix. CMP showing K 5.5, BUN 46, Cr 1.8, mildly elevated AST/ALT, bili 1.6, with anion gap 17 possibly from dehydration vs tachypnea. Dig level low. TSH 9.605, T4 1.2. EKG showing Afib, LVH with some ST changes in inferior leads but similar to prior EKGs (see EKG from 09/11/14). Will consult cardiology and then proceed with admission for CHF.  7:49 PM Cardiology has not yet paged back. Do not want to delay admission, will proceed with calling internal medicine for admission  8:40 PM Still had not heard back, called them again, they returned page and stated that since he was discharged on 4/13 he was no longer their bounce back, and we need to call unassigned pager.   8:56 PM Dr. Selena Batten returning page, will admit. Please see his note for further documentation of care.   Arriah Wadle Camprubi-Soms, PA-C 10/27/14 2057  Donnetta Hutching, MD 10/29/14 [phone]

## 2014-10-27 NOTE — ED Notes (Signed)
Admitting MD at bedside, sister told him the patient cannot drink all of the contrast and MD stated that was fine. CT informed.

## 2014-10-28 ENCOUNTER — Inpatient Hospital Stay (HOSPITAL_COMMUNITY): Payer: Medicare Other

## 2014-10-28 DIAGNOSIS — R7989 Other specified abnormal findings of blood chemistry: Secondary | ICD-10-CM

## 2014-10-28 DIAGNOSIS — I4891 Unspecified atrial fibrillation: Secondary | ICD-10-CM

## 2014-10-28 DIAGNOSIS — J90 Pleural effusion, not elsewhere classified: Secondary | ICD-10-CM | POA: Insufficient documentation

## 2014-10-28 DIAGNOSIS — I509 Heart failure, unspecified: Secondary | ICD-10-CM

## 2014-10-28 DIAGNOSIS — I5023 Acute on chronic systolic (congestive) heart failure: Secondary | ICD-10-CM

## 2014-10-28 LAB — TROPONIN I
TROPONIN I: 0.15 ng/mL — AB (ref <0.031)
TROPONIN I: 0.19 ng/mL — AB (ref <0.031)
Troponin I: 0.24 ng/mL — ABNORMAL HIGH (ref <0.031)

## 2014-10-28 LAB — BASIC METABOLIC PANEL
ANION GAP: 15 (ref 5–15)
BUN: 49 mg/dL — ABNORMAL HIGH (ref 6–20)
CHLORIDE: 112 mmol/L — AB (ref 101–111)
CO2: 17 mmol/L — AB (ref 22–32)
Calcium: 8.8 mg/dL — ABNORMAL LOW (ref 8.9–10.3)
Creatinine, Ser: 1.89 mg/dL — ABNORMAL HIGH (ref 0.61–1.24)
GFR calc Af Amer: 41 mL/min — ABNORMAL LOW (ref >60)
GFR calc non Af Amer: 36 mL/min — ABNORMAL LOW (ref >60)
Glucose, Bld: 46 mg/dL — ABNORMAL LOW (ref 65–99)
POTASSIUM: 4.4 mmol/L (ref 3.5–5.1)
SODIUM: 144 mmol/L (ref 135–145)

## 2014-10-28 LAB — CBC
HCT: 41.6 % (ref 39.0–52.0)
HEMOGLOBIN: 14.1 g/dL (ref 13.0–17.0)
MCH: 29.4 pg (ref 26.0–34.0)
MCHC: 33.9 g/dL (ref 30.0–36.0)
MCV: 86.8 fL (ref 78.0–100.0)
PLATELETS: UNDETERMINED 10*3/uL (ref 150–400)
RBC: 4.79 MIL/uL (ref 4.22–5.81)
RDW: 16.7 % — ABNORMAL HIGH (ref 11.5–15.5)
WBC: 9.9 10*3/uL (ref 4.0–10.5)

## 2014-10-28 LAB — MRSA PCR SCREENING: MRSA by PCR: NEGATIVE

## 2014-10-28 LAB — GLUCOSE, CAPILLARY
GLUCOSE-CAPILLARY: 104 mg/dL — AB (ref 65–99)
GLUCOSE-CAPILLARY: 177 mg/dL — AB (ref 65–99)
GLUCOSE-CAPILLARY: 37 mg/dL — AB (ref 65–99)
Glucose-Capillary: 172 mg/dL — ABNORMAL HIGH (ref 65–99)
Glucose-Capillary: 110 mg/dL — ABNORMAL HIGH (ref 65–99)
Glucose-Capillary: 308 mg/dL — ABNORMAL HIGH (ref 65–99)
Glucose-Capillary: 209 mg/dL — ABNORMAL HIGH (ref 65–99)

## 2014-10-28 MED ORDER — DEXTROSE 50 % IV SOLN
INTRAVENOUS | Status: AC
  Filled 2014-10-28: qty 50

## 2014-10-28 MED ORDER — INSULIN ASPART 100 UNIT/ML ~~LOC~~ SOLN
0.0000 [IU] | Freq: Every day | SUBCUTANEOUS | Status: DC
  Administered 2014-10-28: 2 [IU] via SUBCUTANEOUS

## 2014-10-28 MED ORDER — DEXTROSE 50 % IV SOLN
25.0000 mL | Freq: Once | INTRAVENOUS | Status: AC
Start: 2014-10-28 — End: 2014-10-28
  Administered 2014-10-28: 25 mL via INTRAVENOUS

## 2014-10-28 MED ORDER — INSULIN ASPART 100 UNIT/ML ~~LOC~~ SOLN
0.0000 [IU] | Freq: Three times a day (TID) | SUBCUTANEOUS | Status: DC
  Administered 2014-10-28: 7 [IU] via SUBCUTANEOUS
  Administered 2014-10-29 (×2): 3 [IU] via SUBCUTANEOUS

## 2014-10-28 NOTE — Progress Notes (Addendum)
[facility] Progress Note   URIAH PHILIPSON ZOX:[id number] DOB: 1951/02/08 DOA: 10/27/2014 PCP: Karna Dupes  Brief narrative: MARE LUDTKE is a 64 y.o. male rod in from home with a history of chronic systolic heart failure, coronary artery disease status post MI, paroxysmal atrial fibrillation who was brought to the hospital for dyspnea. He was admitted for mild congestive heart failure, A. fib with RVR and elevated troponin levels.   Subjective: Patient is nonverbal which apparently is his baseline  Assessment/Plan: Principal Problem:   Acute systolic congestive heart failure/pleural effusions -Moderate right-sided and small left-sided pleural effusion -Has been started on IV Lasix and appears to be diuresing well-continue to follow I and O and daily weights -Repeat echo pending -Cardiology following  Active Problems: Permanent atrial fibrillation with RVR -At this time rate is controlled with Coreg - Continue Apixaban  AKI- CK D3 -Possibly related to fluid overload-follow carefully while diuresing  Hypotension -Holding by Bidil and lisinopril  Elevated troponin levels - Suspected by cardiology to be demand ischemia in setting of CHF and RVR  Peripheral artery disease with bilateral amputations -continue aspirin    History of 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  - continue aspirin    T2DM (type 2 diabetes mellitus) -He takes Lantus at home and was given Lantus last night but hypoglycemic this morning -We'll discontinue Lantus and follow on an insulin sliding scale for now   Code Status: DNR Family Communication:  Disposition Plan: home when stable DVT prophylaxis: Eliquis Consultants:cardiology Procedures:  Antibiotics: Anti-infectives    None      Objective: Filed Weights   10/27/14 2235  Weight: 42.6 kg (93 lb 14.7 oz)    Intake/Output Summary (Last 24 hours) at 10/28/14 1242 Last data filed at 10/28/14  0600  Gross per 24 hour  Intake      0 ml  Output    225 ml  Net   -225 ml     Vitals Filed Vitals:   10/28/14 0000 10/28/14 0400 10/28/14 0740 10/28/14 0943  BP: 136/105 155/117 101/72   Pulse:   57 66  Temp: 97.5 F (36.4 C) 97.6 F (36.4 C) 97.4 F (36.3 C)   TempSrc: Axillary Axillary Axillary   Resp: 22 26 52   Weight:      SpO2: 94% 99% 100%     Exam:  General:  Pt is alert but nonverbal and not following commands- not in acute distress  HEENT: No icterus, No thrush  Cardiovascular: regular rate and rhythm, S1/S2 No murmur  Respiratory: clear to auscultation bilaterally - pulse ox 100% on 2 L of oxygen  Abdomen: Soft, +Bowel sounds, non tender, non distended, no guarding  MSK: bilateral amputations of lower extremities  Data Reviewed: Basic Metabolic Panel:  Recent Labs Lab 10/27/14 1737 10/28/14 0810  NA 140 144  K 5.6* 4.4  CL 107 112*  CO2 16* 17*  GLUCOSE 103* 46*  BUN 46* 49*  CREATININE 1.80* 1.89*  CALCIUM 9.3 8.8*   Liver Function Tests:  Recent Labs Lab 10/27/14 1737  AST 96*  ALT 84*  ALKPHOS 116  BILITOT 1.6*  PROT 8.0  ALBUMIN 3.5   No results for input(s): LIPASE, AMYLASE in the last 168 hours. No results for input(s): AMMONIA in the last 168 hours. CBC:  Recent Labs Lab 10/27/14 1737 10/28/14 0810  WBC 10.2 9.9  NEUTROABS 6.8  --   HGB 16.8 14.1  HCT 50.2 41.6  MCV 88.8 86.8  PLT 241 PLATELET CLUMPS NOTED ON SMEAR, UNABLE TO ESTIMATE   Cardiac Enzymes:  Recent Labs Lab 10/28/14 10/28/14 0537  TROPONINI 0.19* 0.24*   BNP (last 3 results)  Recent Labs  09/11/14 1938 10/27/14 1737  BNP 2342.7* 2001.7*    ProBNP (last 3 results) No results for input(s): PROBNP in the last 8760 hours.  CBG:  Recent Labs Lab 10/27/14 1830 10/28/14 0013 10/28/14 0924 10/28/14 0938  GLUCAP 116* 177* 37* 172*    Recent Results (from the past 240 hour(s))  MRSA PCR Screening     Status: None   Collection Time:  10/27/14 10:40 PM  Result Value Ref Range Status   MRSA by PCR NEGATIVE NEGATIVE Final    Comment:        The GeneXpert MRSA Assay (FDA approved for NASAL specimens only), is one component of a comprehensive MRSA colonization surveillance program. It is not intended to diagnose MRSA infection nor to guide or monitor treatment for MRSA infections.      Studies: Ct Abdomen Pelvis Wo Contrast  10/27/2014   CLINICAL DATA:  Patient presents with chief complaint of dyspnea. History of CHF. Patient has a significant history of vascular disease,: These COPD and diabetes as well of congestive heart failure. History of hepatitis-C.  EXAM: CT CHEST, ABDOMEN AND PELVIS WITHOUT CONTRAST  TECHNIQUE: Multidetector CT imaging of the chest, abdomen and pelvis was performed following the standard protocol without IV contrast.  COMPARISON:  Current chest radiograph  FINDINGS: CT CHEST FINDINGS  Thoracic inlet:  No mass or adenopathy.  Normal thyroid.  Mediastinum and hila: Mild cardiomegaly. Dense coronary artery calcifications. Great vessels are normal in caliber. There are several mildly prominent mediastinal lymph nodes, largest a left para carinal node measuring 1 cm short axis. These are likely all reactive. No hilar masses or adenopathy.  Lungs and pleura: Moderate right and small left pleural effusions. Lower lobe bronchial wall thickening, which may be chronic. There is mild dependent atelectasis in the lower lobes. No pulmonary edema. No evidence of pneumonia. Paraseptal emphysema is noted in the lung apices. No pneumothorax. No lung mass or suspicious nodule.  CT ABDOMEN AND PELVIS FINDINGS  Liver: Normal in size in overall morphology and abnormal attenuation. No mass or focal lesion.  Gallbladder: Not visualized, it is presumed to be surgically absent. No bile duct dilation.  Pancreas: Atrophic.  No mass or evidence of inflammation.  Spleen and adrenal glands:  Unremarkable.  Kidneys, ureters and bladder:  Bladder completely decompressed. Kidneys in ureters are unremarkable.  Vascular: Mild ectasia of the abdominal aorta. Scattered atherosclerotic calcifications along the aorta and its branch vessels.  Lymph nodes:  No enlarged or abnormal appearing lymph nodes.  Bowel: Stomach moderately distended. There are several scattered colonic diverticula. No diverticulitis. Colon otherwise unremarkable. Normal small bowel. Normal appendix visualized.  Ascites:  None.  MUSCULOSKELETAL  Bones are demineralized. There are degenerative changes of the lumbar spine. There arthropathic changes of the hips and shoulders. No osteoblastic or osteolytic lesions.  IMPRESSION: 1. Moderate right and small left pleural effusions with associated dependent lower lobe subsegmental atelectasis. There is cardiomegaly, but no evidence of pulmonary edema. 2. No convincing pneumonia. 3. Dense coronary artery calcifications. 4. There are no acute findings below the diaphragm. No CT evidence of cirrhosis or of a liver mass.   Electronically Signed   By: Amie Portland M.D.   On: 10/27/2014 22:28   Dg Chest 2 View  10/27/2014   CLINICAL DATA:  Shortness of breath for 3 days. History of atrial fibrillation, congestive heart failure, COPD, hypertension and diabetes. Initial encounter.  EXAM: CHEST  2 VIEW  COMPARISON:  10/15/2013 and 10/14/2013 radiographs.  FINDINGS: There are new moderate size dependent pleural effusions bilaterally with associated bibasilar atelectasis. The heart is enlarged. Coronary artery calcifications noted. There is vascular congestion without overt pulmonary edema or confluent airspace opacity. The bones appear unchanged.  IMPRESSION: 1. New pleural effusions with associated bibasilar atelectasis. 2. Cardiomegaly or pericardial effusion with mild vascular congestion.   Electronically Signed   By: Carey Bullocks M.D.   On: 10/27/2014 18:10   Ct Chest Wo Contrast  10/27/2014   CLINICAL DATA:  Patient presents with chief  complaint of dyspnea. History of CHF. Patient has a significant history of vascular disease,: These COPD and diabetes as well of congestive heart failure. History of hepatitis-C.  EXAM: CT CHEST, ABDOMEN AND PELVIS WITHOUT CONTRAST  TECHNIQUE: Multidetector CT imaging of the chest, abdomen and pelvis was performed following the standard protocol without IV contrast.  COMPARISON:  Current chest radiograph  FINDINGS: CT CHEST FINDINGS  Thoracic inlet:  No mass or adenopathy.  Normal thyroid.  Mediastinum and hila: Mild cardiomegaly. Dense coronary artery calcifications. Great vessels are normal in caliber. There are several mildly prominent mediastinal lymph nodes, largest a left para carinal node measuring 1 cm short axis. These are likely all reactive. No hilar masses or adenopathy.  Lungs and pleura: Moderate right and small left pleural effusions. Lower lobe bronchial wall thickening, which may be chronic. There is mild dependent atelectasis in the lower lobes. No pulmonary edema. No evidence of pneumonia. Paraseptal emphysema is noted in the lung apices. No pneumothorax. No lung mass or suspicious nodule.  CT ABDOMEN AND PELVIS FINDINGS  Liver: Normal in size in overall morphology and abnormal attenuation. No mass or focal lesion.  Gallbladder: Not visualized, it is presumed to be surgically absent. No bile duct dilation.  Pancreas: Atrophic.  No mass or evidence of inflammation.  Spleen and adrenal glands:  Unremarkable.  Kidneys, ureters and bladder: Bladder completely decompressed. Kidneys in ureters are unremarkable.  Vascular: Mild ectasia of the abdominal aorta. Scattered atherosclerotic calcifications along the aorta and its branch vessels.  Lymph nodes:  No enlarged or abnormal appearing lymph nodes.  Bowel: Stomach moderately distended. There are several scattered colonic diverticula. No diverticulitis. Colon otherwise unremarkable. Normal small bowel. Normal appendix visualized.  Ascites:  None.   MUSCULOSKELETAL  Bones are demineralized. There are degenerative changes of the lumbar spine. There arthropathic changes of the hips and shoulders. No osteoblastic or osteolytic lesions.  IMPRESSION: 1. Moderate right and small left pleural effusions with associated dependent lower lobe subsegmental atelectasis. There is cardiomegaly, but no evidence of pulmonary edema. 2. No convincing pneumonia. 3. Dense coronary artery calcifications. 4. There are no acute findings below the diaphragm. No CT evidence of cirrhosis or of a liver mass.   Electronically Signed   By: Amie Portland M.D.   On: 10/27/2014 22:28    Scheduled Meds:  Scheduled Meds: . apixaban  2.5 mg Oral BID  . aspirin EC  81 mg Oral Daily  . atorvastatin  40 mg Oral Daily  . calcitRIOL  0.25 mcg Oral Daily  . carvedilol  12.5 mg Oral BID WC  . dextrose      . digoxin  0.0625 mg Oral Daily  . feeding supplement (GLUCERNA SHAKE)  237 mL Oral TID BM  . furosemide  20  mg Intravenous Q12H  . insulin glargine  10 Units Subcutaneous QHS  . [START ON 11/03/2014] Vitamin D (Ergocalciferol)  50,000 Units Oral Q Mon   Continuous Infusions:   Time spent on care of this patient: 35 min   Daniyah Fohl, MD 10/28/2014, 12:42 PM  LOS: 1 day   [facility] Office  [phone] Pager - Text Page per [email or web address]  If 7PM-7AM, please contact night-coverage [email or web address]

## 2014-10-28 NOTE — Discharge Instructions (Signed)

## 2014-10-28 NOTE — Progress Notes (Signed)
     SUBJECTIVE: Pt has aphasia. Unable to express complaints.   BP 155/117 mmHg  Pulse 61  Temp(Src) 97.6 F (36.4 C) (Axillary)  Resp 26  Wt 93 lb 14.7 oz (42.6 kg)  SpO2 99%  Intake/Output Summary (Last 24 hours) at 10/28/14 4403 Last data filed at 10/28/14 0600  Gross per 24 hour  Intake      0 ml  Output    225 ml  Net   -225 ml    PHYSICAL EXAM General: Thin, cachectic male. No acute distress. Alert  Psych:  Aphasic Neck: No JVD. No masses noted.  Lungs: Clear bilaterally with no wheezes or rhonci noted.  Heart: Irregular with no murmurs noted. Abdomen: Bowel sounds are present. Soft, non-tender.  Extremities: bilateral lower ext amputations  LABS: Basic Metabolic Panel:  Recent Labs  47/42/59 1737  NA 140  K 5.6*  CL 107  CO2 16*  GLUCOSE 103*  BUN 46*  CREATININE 1.80*  CALCIUM 9.3   CBC:  Recent Labs  10/27/14 1737  WBC 10.2  NEUTROABS 6.8  HGB 16.8  HCT 50.2  MCV 88.8  PLT 241   Cardiac Enzymes:  Recent Labs  10/28/14 10/28/14 0537  TROPONINI 0.19* 0.24*    Current Meds: . apixaban  2.5 mg Oral BID  . aspirin EC  81 mg Oral Daily  . atorvastatin  40 mg Oral Daily  . calcitRIOL  0.25 mcg Oral Daily  . carvedilol  12.5 mg Oral BID WC  . digoxin  0.0625 mg Oral Daily  . feeding supplement (GLUCERNA SHAKE)  237 mL Oral TID BM  . furosemide  20 mg Intravenous Q12H  . insulin glargine  10 Units Subcutaneous QHS  . [START ON 11/03/2014] Vitamin D (Ergocalciferol)  50,000 Units Oral Q Mon     ASSESSMENT AND PLAN: 64 yo male with extensive history of multiple medical problems including previous stroke with residual complete expressive aphasia and right hemiparesis, severe left ventricular systolic dysfunction with chronic heart failure (presumed to be ischemic cardiomyopathy), permanent atrial fibrillation, extensive PAD status post bilateral lower extremity amputations, hypertension, diabetes mellitus, COPD and recently diagnosed  hyperthyroidism (with a low radioactive Iodine uptake suggestive of possible subacute thyroiditis) admitted with acute CHF exacerbation.   1. Acute on chronic systolic heart failure: Continue diuresis with IV Lasix. Follow I/O and renal function closely today. BMET pending this am. If renal function stable, consider increasing Lasix to 40 mg IV BID for today and repeat BNP, chest x-ray tomorrow.   2. Elevated troponin: Likely due to demand ischemia in setting of atrial fib with RVR, CHF. He is presumed to have CAD but is not an optimal cath candidate with renal insufficiency.   3. Permanent atrial fibrillation: Elevated rates this admission but now controlled. Continue beta blocker for rate control. Continue Eliquis for anticoagulation.   4. PAD:  s/p L BKA and R AKA  Yoceline Bazar  5/24/20167:05 AM

## 2014-10-28 NOTE — Progress Notes (Signed)
Echocardiogram 2D Echocardiogram has been performed.  [name] 10/28/2014, 9:04 AM

## 2014-10-28 NOTE — Progress Notes (Signed)
CRITICAL VALUE ALERT  Critical value received:  CBG 37 mg/dl  Date of notification:  10/28/2014  Time of notification:  09:34 a.m.  Critical value read back: yes  Nurse who received alert:  Scherrie Bateman  MD notified (1st page):  Dr. Karlyne Greenspan  Time of first page:  09:38 a.m.  MD notified (2nd page):  Time of second page:  Responding MD: Dr Butler Denmark  Time MD responded:  [phone]

## 2014-10-29 DIAGNOSIS — I5021 Acute systolic (congestive) heart failure: Secondary | ICD-10-CM

## 2014-10-29 DIAGNOSIS — Z8673 Personal history of transient ischemic attack (TIA), and cerebral infarction without residual deficits: Secondary | ICD-10-CM

## 2014-10-29 LAB — GLUCOSE, CAPILLARY
Glucose-Capillary: 201 mg/dL — ABNORMAL HIGH (ref 65–99)
Glucose-Capillary: 269 mg/dL — ABNORMAL HIGH (ref 65–99)

## 2014-10-29 LAB — BASIC METABOLIC PANEL
ANION GAP: 13 (ref 5–15)
BUN: 52 mg/dL — ABNORMAL HIGH (ref 6–20)
CALCIUM: 8.5 mg/dL — AB (ref 8.9–10.3)
CO2: 19 mmol/L — AB (ref 22–32)
Chloride: 109 mmol/L (ref 101–111)
Creatinine, Ser: 1.95 mg/dL — ABNORMAL HIGH (ref 0.61–1.24)
GFR calc non Af Amer: 34 mL/min — ABNORMAL LOW (ref >60)
GFR, EST AFRICAN AMERICAN: 40 mL/min — AB (ref >60)
Glucose, Bld: 157 mg/dL — ABNORMAL HIGH (ref 65–99)
Potassium: 4.1 mmol/L (ref 3.5–5.1)
SODIUM: 141 mmol/L (ref 135–145)

## 2014-10-29 MED ORDER — ENSURE PUDDING PO PUDG: 1.0000 | Freq: Two times a day (BID) | ORAL | Status: AC

## 2014-10-29 MED ORDER — FUROSEMIDE 40 MG PO TABS: 40.0000 mg | ORAL_TABLET | Freq: Every day | ORAL | Status: AC

## 2014-10-29 NOTE — Progress Notes (Signed)
Retro ur review done. 

## 2014-10-29 NOTE — Discharge Summary (Signed)
Physician Discharge Summary  [name] ZOX:[id number] DOB: 05-Jun-1951 DOA: 10/27/2014  PCP: Scott Converse, PA-C  Admit date: 10/27/2014 Discharge date: 10/29/2014  Time spent: 50 minutes  Recommendations for Outpatient Follow-up:  1. 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 male rod in from home with a history of chronic systolic heart failure, coronary artery disease status post MI, paroxysmal atrial fibrillation who was brought to the hospital for dyspnea. He was admitted for mild congestive heart failure, A. fib with RVR and elevated troponin levels.  Hospital Course:  Principal Problem:  Acute systolic congestive heart failure/pleural effusions -Moderate right-sided and small left-sided pleural effusion -Was diuresed with IV Lasix-oxygen saturation saturations are stable on room air today- no crackles on exam-he is unable to verbalize and therefore unable to tell if he symptomatic but he is not short of breath at rest -Repeat echo reveals that Scott EF has dropped to 15-20%- previous echo in April of this year estimated to be 20-25% - as this point, cardiology is recommending that he should be on lasix at 40 mg daily to prevent further fluid overload -I have spoken with cardiology and we are both in agreement that palliative care discussion for goals of care should take place at this point - I have discussed with [name] in detail that I would  recommend palliative care and hospice follow-up at home essentially for the severity of Scott heart failure, and multiple co morbidities including PVD, CVA which he is nonverbal from, COPD, stage III chronic kidney disease and atrial fibrillation with RVR. I have spoken with [name] who is Scott power of attorney and also Scott main caretaker- she is a CNA and states that she is able to adequately take care of him and at this time is declining hospice at home. She is declining to speak with palliative care. He states that Scott CODE STATUS is DO NOT RESUSCITATE, understands that Scott prognosis is poor and if he is to physically decline, she would feel comfortable taking care of him at home and would prefer him to die at home rather than add a hospice facility.   Active Problems: Permanent atrial fibrillation with RVR - At this time rate is controlled with Coreg - Continue Apixaban  AKI- CKD 3 -Possibly related to fluid overload-follow carefully while diuresing  Hypotension -Holding by Bidil and lisinopril as BP has been in the low 100s- continue to hold these medications at this time  Elevated troponin levels - Suspected by cardiology to be demand ischemia in setting of CHF and RVR- no further workup recommended  Peripheral artery disease with bilateral amputations -continue aspirin  History of 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 - continue aspirin  T2DM (type 2 diabetes mellitus) -He takes Lantus at home and was while in the hospital Lantus (which is higher than Scott home dose ) but he developed hypoglycemia the next morning and therefore Lantus was held -  Sugars have subsequently been elevated and therefore will resume Lantus at a lower dose    Procedures:  2 D ECHO Left ventricle: The cavity size was mildly dilated. Systolic function was severely reduced. The estimated ejection fraction was in the range of 15% to 20%. Diffuse hypokinesis. The study is not  technically sufficient to allow evaluation of LV diastolic function. There was spontaneous echo contrast, indicative of stasis. - Mitral valve: Calcified annulus. Mildly thickened leaflets . There was moderate regurgitation directed posteriorly. - Left atrium: The atrium was mildly dilated. - Right ventricle: Systolic function was moderately reduced. - Right atrium: The atrium was mildly dilated. - Pericardium, extracardiac: There was a left pleural effusion. - Impressions: When compared to prior ECHO, EF is reduced.  Impressions:  - When compared to prior ECHO, EF is reduced.  Consultations:  cardiology  Discharge Exam: Filed Weights   10/27/14 2235 10/28/14 1505  Weight: 42.6 kg (93 lb 14.7 oz) 45.3 kg (99 lb 13.9 oz)   Filed Vitals:   10/29/14 1021  BP:   Pulse: 67  Temp:   Resp:     General: AAO x 3, no distress Cardiovascular: RRR, no murmurs  Respiratory: clear to auscultation bilaterally GI: soft, non-tender, non-distended, bowel sound positive  Discharge Instructions You were cared for by a hospitalist during your hospital stay. If you have any questions about your discharge medications or the care you received while you were in the hospital after you are discharged, you can call the unit and asked to speak with the hospitalist on call if the hospitalist that took care of you is not available. Once you are discharged, your primary care physician will handle any further medical issues. Please note that NO REFILLS for any discharge medications will be authorized once you are discharged, as it is imperative that you return to your primary care physician (or establish a relationship with a primary care physician if you do not have one) for your aftercare needs so that they can reassess your need for medications and monitor your lab values.      Discharge Instructions    Discharge instructions    Complete by:  As directed   Sodium heart healthy diabetic diet      Increase activity slowly    Complete by:  As directed             Medication List    STOP taking these medications        isosorbide-hydrALAZINE 20-37.5 MG per tablet  Commonly known as:  BIDIL     lisinopril 20 MG tablet  Commonly known as:  PRINIVIL,ZESTRIL     metoprolol tartrate 25 MG tablet  Commonly known as:  LOPRESSOR      TAKE these medications        apixaban 2.5 MG Tabs tablet  Commonly known as:  ELIQUIS  Take 1 tablet (2.5 mg total) by mouth 2 (two) times daily.     aspirin EC 81 MG tablet  Take 81 mg by mouth daily.     atorvastatin 40 MG tablet  Commonly known as:  LIPITOR  Take 1 tablet (40 mg total) by mouth daily.     calcitRIOL 0.25 MCG capsule  Commonly known as:  ROCALTROL  Take 0.25 mcg by mouth daily.     carvedilol 12.5 MG tablet  Commonly known as:  COREG  Take 1 tablet (12.5 mg total) by mouth 2 (two) times daily with a meal.     digoxin  0.125 MG tablet  Commonly known as:  LANOXIN  Take 0.125 mg by mouth every morning.     feeding supplement (GLUCERNA SHAKE) Liqd  Take 237 mLs by mouth 3 (three) times daily between meals.     furosemide 40 MG tablet  Commonly known as:  LASIX  Take 1 tablet (40 mg total) by mouth daily.     glipiZIDE 5 MG tablet  Commonly known as:  GLUCOTROL  Take 5 mg by mouth 2 (two) times daily before a meal.     insulin aspart 100 UNIT/ML injection  Commonly known as:  novoLOG  Inject 0-9 Units into the skin 3 (three) times daily before meals. If 70-120 give 0units, if 121-150 give 1 units, 151-200 give 2 units, 201-250 give 3 units, 251-300 give 5 units, 301-350 give 7 units, 351-400 give 9 units     insulin glargine 100 UNIT/ML injection  Commonly known as:  LANTUS  Inject 0-5 Units into the skin at bedtime. If BS is 70-200 give 0units, 201-250 give 2 units, 251-300 give 3 units, 301-350 give 4 units, 351-400 give 5 units.     pravastatin 20 MG tablet  Commonly known as:  PRAVACHOL  Take 20 mg by  mouth every morning.     RESOURCE THICKENUP CLEAR Powd  As needed     Vitamin D (Ergocalciferol) 50000 UNITS Caps capsule  Commonly known as:  DRISDOL  Take 50,000 Units by mouth every 7 (seven) days. Take on Mondays       No Known Allergies    The results of significant diagnostics from this hospitalization (including imaging, microbiology, ancillary and laboratory) are listed below for reference.    Significant Diagnostic Studies: Ct Abdomen Pelvis Wo Contrast  10/27/2014   CLINICAL DATA:  Patient presents with chief complaint of dyspnea. History of CHF. Patient has a significant history of vascular disease,: These COPD and diabetes as well of congestive heart failure. History of hepatitis-C.  EXAM: CT CHEST, ABDOMEN AND PELVIS WITHOUT CONTRAST  TECHNIQUE: Multidetector CT imaging of the chest, abdomen and pelvis was performed following the standard protocol without IV contrast.  COMPARISON:  Current chest radiograph  FINDINGS: CT CHEST FINDINGS  Thoracic inlet:  No mass or adenopathy.  Normal thyroid.  Mediastinum and hila: Mild cardiomegaly. Dense coronary artery calcifications. Great vessels are normal in caliber. There are several mildly prominent mediastinal lymph nodes, largest a left para carinal node measuring 1 cm short axis. These are likely all reactive. No hilar masses or adenopathy.  Lungs and pleura: Moderate right and small left pleural effusions. Lower lobe bronchial wall thickening, which may be chronic. There is mild dependent atelectasis in the lower lobes. No pulmonary edema. No evidence of pneumonia. Paraseptal emphysema is noted in the lung apices. No pneumothorax. No lung mass or suspicious nodule.  CT ABDOMEN AND PELVIS FINDINGS  Liver: Normal in size in overall morphology and abnormal attenuation. No mass or focal lesion.  Gallbladder: Not visualized, it is presumed to be surgically absent. No bile duct dilation.  Pancreas: Atrophic.  No mass or evidence of inflammation.   Spleen and adrenal glands:  Unremarkable.  Kidneys, ureters and bladder: Bladder completely decompressed. Kidneys in ureters are unremarkable.  Vascular: Mild ectasia of the abdominal aorta. Scattered atherosclerotic calcifications along the aorta and its branch vessels.  Lymph nodes:  No enlarged or abnormal appearing lymph nodes.  Bowel: Stomach moderately distended. There are several scattered colonic diverticula. No diverticulitis. Colon otherwise unremarkable. Normal small bowel.  Normal appendix visualized.  Ascites:  None.  MUSCULOSKELETAL  Bones are demineralized. There are degenerative changes of the lumbar spine. There arthropathic changes of the hips and shoulders. No osteoblastic or osteolytic lesions.  IMPRESSION: 1. Moderate right and small left pleural effusions with associated dependent lower lobe subsegmental atelectasis. There is cardiomegaly, but no evidence of pulmonary edema. 2. No convincing pneumonia. 3. Dense coronary artery calcifications. 4. There are no acute findings below the diaphragm. No CT evidence of cirrhosis or of a liver mass.   Electronically Signed   By: Amie Portland M.D.   On: 10/27/2014 22:28   Dg Chest 2 View  10/27/2014   CLINICAL DATA:  Shortness of breath for 3 days. History of atrial fibrillation, congestive heart failure, COPD, hypertension and diabetes. Initial encounter.  EXAM: CHEST  2 VIEW  COMPARISON:  10/15/2013 and 10/14/2013 radiographs.  FINDINGS: There are new moderate size dependent pleural effusions bilaterally with associated bibasilar atelectasis. The heart is enlarged. Coronary artery calcifications noted. There is vascular congestion without overt pulmonary edema or confluent airspace opacity. The bones appear unchanged.  IMPRESSION: 1. New pleural effusions with associated bibasilar atelectasis. 2. Cardiomegaly or pericardial effusion with mild vascular congestion.   Electronically Signed   By: Carey Bullocks M.D.   On: 10/27/2014 18:10   Ct Chest  Wo Contrast  10/27/2014   CLINICAL DATA:  Patient presents with chief complaint of dyspnea. History of CHF. Patient has a significant history of vascular disease,: These COPD and diabetes as well of congestive heart failure. History of hepatitis-C.  EXAM: CT CHEST, ABDOMEN AND PELVIS WITHOUT CONTRAST  TECHNIQUE: Multidetector CT imaging of the chest, abdomen and pelvis was performed following the standard protocol without IV contrast.  COMPARISON:  Current chest radiograph  FINDINGS: CT CHEST FINDINGS  Thoracic inlet:  No mass or adenopathy.  Normal thyroid.  Mediastinum and hila: Mild cardiomegaly. Dense coronary artery calcifications. Great vessels are normal in caliber. There are several mildly prominent mediastinal lymph nodes, largest a left para carinal node measuring 1 cm short axis. These are likely all reactive. No hilar masses or adenopathy.  Lungs and pleura: Moderate right and small left pleural effusions. Lower lobe bronchial wall thickening, which may be chronic. There is mild dependent atelectasis in the lower lobes. No pulmonary edema. No evidence of pneumonia. Paraseptal emphysema is noted in the lung apices. No pneumothorax. No lung mass or suspicious nodule.  CT ABDOMEN AND PELVIS FINDINGS  Liver: Normal in size in overall morphology and abnormal attenuation. No mass or focal lesion.  Gallbladder: Not visualized, it is presumed to be surgically absent. No bile duct dilation.  Pancreas: Atrophic.  No mass or evidence of inflammation.  Spleen and adrenal glands:  Unremarkable.  Kidneys, ureters and bladder: Bladder completely decompressed. Kidneys in ureters are unremarkable.  Vascular: Mild ectasia of the abdominal aorta. Scattered atherosclerotic calcifications along the aorta and its branch vessels.  Lymph nodes:  No enlarged or abnormal appearing lymph nodes.  Bowel: Stomach moderately distended. There are several scattered colonic diverticula. No diverticulitis. Colon otherwise unremarkable.  Normal small bowel. Normal appendix visualized.  Ascites:  None.  MUSCULOSKELETAL  Bones are demineralized. There are degenerative changes of the lumbar spine. There arthropathic changes of the hips and shoulders. No osteoblastic or osteolytic lesions.  IMPRESSION: 1. Moderate right and small left pleural effusions with associated dependent lower lobe subsegmental atelectasis. There is cardiomegaly, but no evidence of pulmonary edema. 2. No convincing pneumonia. 3. Dense coronary artery  calcifications. 4. There are no acute findings below the diaphragm. No CT evidence of cirrhosis or of a liver mass.   Electronically Signed   By: Amie Portland M.D.   On: 10/27/2014 22:28   Nm Thyroid Sng Uptake W/imaging  10/01/2014   CLINICAL DATA:  Hyperthyroidism.  TSH equal 0.3  EXAM: THYROID SCAN AND UPTAKE - 24 HOURS  TECHNIQUE: Following the per oral administration of I-131 sodium iodide, the patient returned at 24 hours and uptake measurements were acquired with the uptake probe centered on the neck. Thyroid imaging was performed following the intravenous administration of the Tc-25m Pertechnetate.  RADIOPHARMACEUTICALS:  14.4 microCuries I-131 sodium iodide orally and 10.0 mCi Technetium-71m pertechnetate  COMPARISON:  None  FINDINGS: There is low uptake within the thyroid gland. The thyroid gland is barely visible.  24 hour I 131 uptake = 1.7% (normal 10-30%)  IMPRESSION: Decreased thyroid uptake by imaging and probe detection. Findings suggest subacute thyroiditis versus exogenous iodine.   Electronically Signed   By: Genevive Bi M.D.   On: 10/01/2014 16:10    Microbiology: Recent Results (from the past 240 hour(s))  MRSA PCR Screening     Status: None   Collection Time: 10/27/14 10:40 PM  Result Value Ref Range Status   MRSA by PCR NEGATIVE NEGATIVE Final    Comment:        The GeneXpert MRSA Assay (FDA approved for NASAL specimens only), is one component of a comprehensive MRSA  colonization surveillance program. It is not intended to diagnose MRSA infection nor to guide or monitor treatment for MRSA infections.      Labs: Basic Metabolic Panel:  Recent Labs Lab 10/27/14 1737 10/28/14 0810 10/29/14 0327  NA 140 144 141  K 5.6* 4.4 4.1  CL 107 112* 109  CO2 16* 17* 19*  GLUCOSE 103* 46* 157*  BUN 46* 49* 52*  CREATININE 1.80* 1.89* 1.95*  CALCIUM 9.3 8.8* 8.5*   Liver Function Tests:  Recent Labs Lab 10/27/14 1737  AST 96*  ALT 84*  ALKPHOS 116  BILITOT 1.6*  PROT 8.0  ALBUMIN 3.5   No results for input(s): LIPASE, AMYLASE in the last 168 hours. No results for input(s): AMMONIA in the last 168 hours. CBC:  Recent Labs Lab 10/27/14 1737 10/28/14 0810  WBC 10.2 9.9  NEUTROABS 6.8  --   HGB 16.8 14.1  HCT 50.2 41.6  MCV 88.8 86.8  PLT 241 PLATELET CLUMPS NOTED ON SMEAR, UNABLE TO ESTIMATE   Cardiac Enzymes:  Recent Labs Lab 10/28/14 10/28/14 0537 10/28/14 1327  TROPONINI 0.19* 0.24* 0.15*   BNP: BNP (last 3 results)  Recent Labs  09/11/14 1938 10/27/14 1737  BNP 2342.7* 2001.7*    ProBNP (last 3 results) No results for input(s): PROBNP in the last 8760 hours.  CBG:  Recent Labs Lab 10/28/14 0938 10/28/14 1237 10/28/14 1311 10/28/14 1730 10/28/14 2127  GLUCAP 172* 110* 104* 308* 209*       SignedCalvert Cantor, MD [facility] 10/29/2014, 10:24 AM

## 2014-10-29 NOTE — Progress Notes (Addendum)
SUBJECTIVE: Pt non-verbal. Nods head no to questions. Denies pain or SOB.   BP 105/78 mmHg  Pulse 78  Temp(Src) 97.2 F (36.2 C) (Axillary)  Resp 23  Ht 5\' 7"  (1.702 m)  Wt 99 lb 13.9 oz (45.3 kg)  BMI 15.64 kg/m2  SpO2 93%  Intake/Output Summary (Last 24 hours) at 10/29/14 6811 Last data filed at 10/29/14 0500  Gross per 24 hour  Intake    577 ml  Output    600 ml  Net    -23 ml    PHYSICAL EXAM General: Thin, cachectic male. Aphasic. Awake and alert.  Neck: No JVD. No masses noted.  Lungs: Clear bilaterally with no wheezes or rhonci noted.  Heart: Irreg with no murmurs noted. Abdomen: Bowel sounds are present. Soft, non-tender.  Extremities: bilat lower ext amputations.   LABS: Basic Metabolic Panel:  Recent Labs  57/26/20 0810 10/29/14 0327  NA 144 141  K 4.4 4.1  CL 112* 109  CO2 17* 19*  GLUCOSE 46* 157*  BUN 49* 52*  CREATININE 1.89* 1.95*  CALCIUM 8.8* 8.5*   CBC:  Recent Labs  10/27/14 1737 10/28/14 0810  WBC 10.2 9.9  NEUTROABS 6.8  --   HGB 16.8 14.1  HCT 50.2 41.6  MCV 88.8 86.8  PLT 241 PLATELET CLUMPS NOTED ON SMEAR, UNABLE TO ESTIMATE   Cardiac Enzymes:  Recent Labs  10/28/14 10/28/14 0537 10/28/14 1327  TROPONINI 0.19* 0.24* 0.15*    Current Meds: . apixaban  2.5 mg Oral BID  . aspirin EC  81 mg Oral Daily  . atorvastatin  40 mg Oral Daily  . calcitRIOL  0.25 mcg Oral Daily  . carvedilol  12.5 mg Oral BID WC  . digoxin  0.0625 mg Oral Daily  . feeding supplement (GLUCERNA SHAKE)  237 mL Oral TID BM  . furosemide  20 mg Intravenous Q12H  . insulin aspart  0-5 Units Subcutaneous QHS  . insulin aspart  0-9 Units Subcutaneous TID WC  . [START ON 11/03/2014] Vitamin D (Ergocalciferol)  50,000 Units Oral Q Mon   Echo 10/28/14: Left ventricle: The cavity size was mildly dilated. Systolic function was severely reduced. The estimated ejection fraction was in the range of 15% to 20%. Diffuse hypokinesis. The study  is not technically sufficient to allow evaluation of LV diastolic function. There was spontaneous echo contrast, indicative of stasis. - Mitral valve: Calcified annulus. Mildly thickened leaflets . There was moderate regurgitation directed posteriorly. - Left atrium: The atrium was mildly dilated. - Right ventricle: Systolic function was moderately reduced. - Right atrium: The atrium was mildly dilated. - Pericardium, extracardiac: There was a left pleural effusion. - Impressions: When compared to prior ECHO, EF is reduced. Impressions: - When compared to prior ECHO, EF is reduced.  ASSESSMENT AND PLAN: 64 yo male with extensive PMH including previous stroke with residual complete expressive aphasia and right hemiparesis, severe left ventricular systolic dysfunction with chronic heart failure (presumed to be ischemic cardiomyopathy), permanent atrial fibrillation, extensive PAD status post bilateral lower extremity amputations, hypertension, diabetes mellitus, COPD and recently diagnosed hyperthyroidism (with a low radioactive Iodine uptake suggestive of possible subacute thyroiditis) admitted with SOB and presumed acute CHF exacerbation.   1. Acute on chronic systolic heart failure: Difficult to say if he has had symptomatic improvement with diuresis. He has diuresed 250cc since admission. Echo with LVEF=15-20%. Renal function is slightly worse today. Would change to Lasix 40 mg po daily. He was not  on Lasix at home. I would not pursue an ischemic evaluation given overall poor functional state. I think palliative care would be appropriate.   2. Elevated troponin: Likely due to demand ischemia in setting of atrial fib with RVR, CHF. He is presumed to have CAD but is not an optimal cath candidate with renal insufficiency. No ischemic workup is planned. Recommend to move toward palliative care.   3. Permanent atrial fibrillation: HR controlled. Continue beta blocker for rate control.  Continue Eliquis for anticoagulation.   4. PAD: s/p L BKA and R AKA  He appears to be at his baseline. Weight is near baseline. Would get palliative care consult.    MCALHANY,CHRISTOPHER  5/25/20167:23 AM

## 2014-10-29 NOTE — Progress Notes (Signed)
Bjorn Loser, patient's POA is here and was able to talk to Dr. Butler Denmark.  Discharge instructions given and she verbalized understanding.  She stated that it would be nice if the patient can stay 1 more night.  I informed her that at this point, patient's vital signs is stable and within normal limits.

## 2014-10-29 NOTE — Progress Notes (Signed)
Patient is discharge to home per [facility] ambulance.  [name], patient's sister, is notified prior to transport.

## 2014-10-29 NOTE — Care Management Note (Signed)
Case Management Note  Patient Details  Name: [name] MRN: [id number] Date of Birth: 1951/03/07  Subjective/Objective:     SOB               Action/Plan: Pt active in CAP, sister is paid caregiver through [facility], she also provides 24/7 care as pt lives with her.    Expected Discharge Date:                  Expected Discharge Plan:  Home/Self Care  In-House Referral:     Discharge planning Services  CM Consult  Post Acute Care Choice:    Choice offered to:     DME Arranged:    DME Agency:     HH Arranged:    HH Agency:     Status of Service:  Completed, signed off  Medicare Important Message Given:  No Date Medicare IM Given:    Medicare IM give by:    Date Additional Medicare IM Given:    Additional Medicare Important Message give by:     If discussed at Long Length of Stay Meetings, dates discussed:    Additional Comments:  Magdalene River, RN 10/29/2014, 11:15 AM

## 2014-10-31 LAB — HEPATITIS PANEL, ACUTE
HCV Ab: REACTIVE — AB
Hep A IgM: NONREACTIVE
Hep B C IgM: NONREACTIVE
Hepatitis B Surface Ag: NEGATIVE

## 2014-11-05 DIAGNOSIS — Z899 Acquired absence of limb, unspecified: Secondary | ICD-10-CM | POA: Diagnosis not present

## 2014-11-18 DIAGNOSIS — E119 Type 2 diabetes mellitus without complications: Secondary | ICD-10-CM | POA: Diagnosis not present

## 2014-12-05 DEATH — deceased

## 2016-10-15 IMAGING — DX DG CHEST 2V
2 series · 2 of 2 positions shown · non-contrast
Comparison: 10/15/2013 and 10/14/2013 radiographs.

CLINICAL DATA: Shortness of breath for 3 days. History of atrial
fibrillation, congestive heart failure, COPD, hypertension and
diabetes. Initial encounter.

EXAM:
CHEST  2 VIEW

[chest lat]
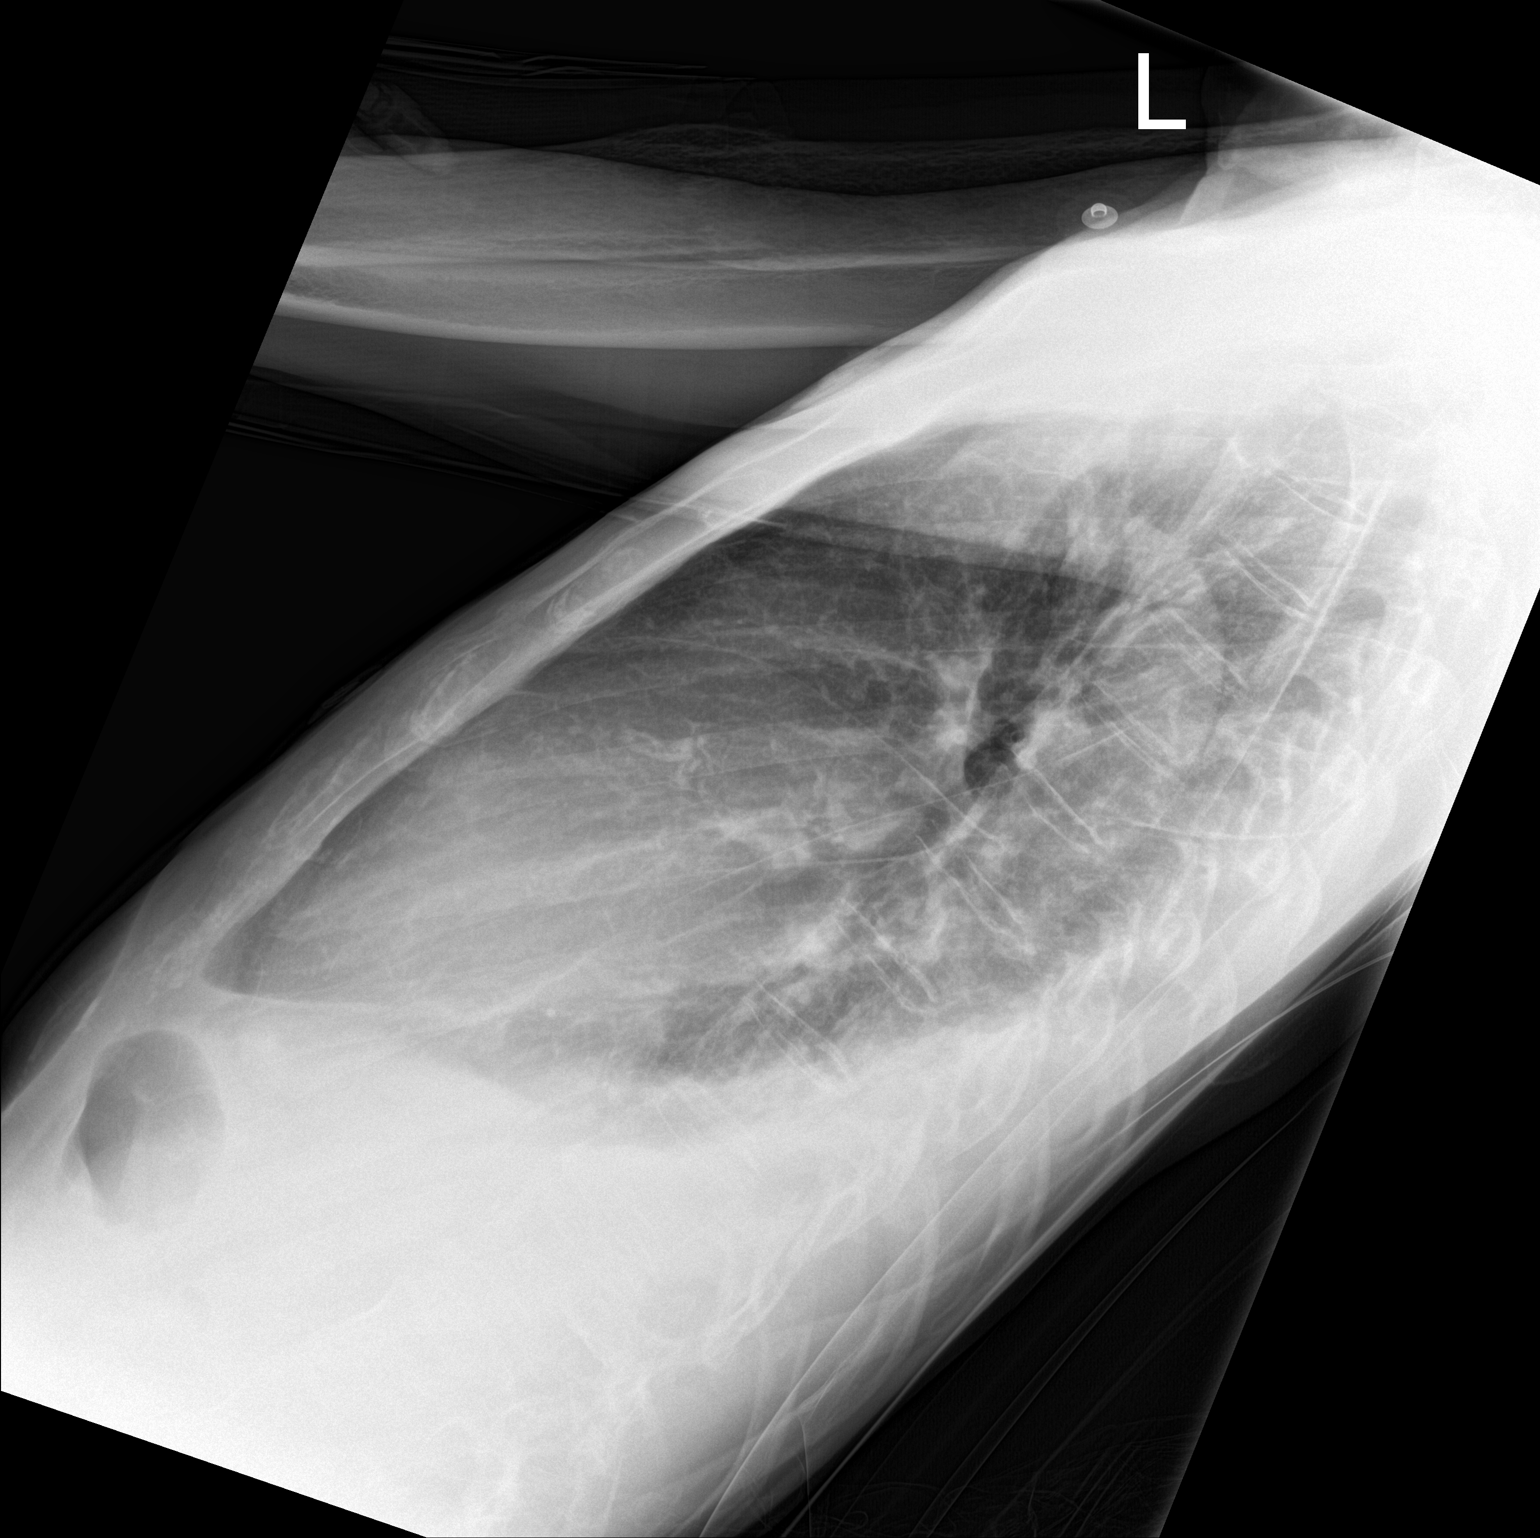

[chest ap]
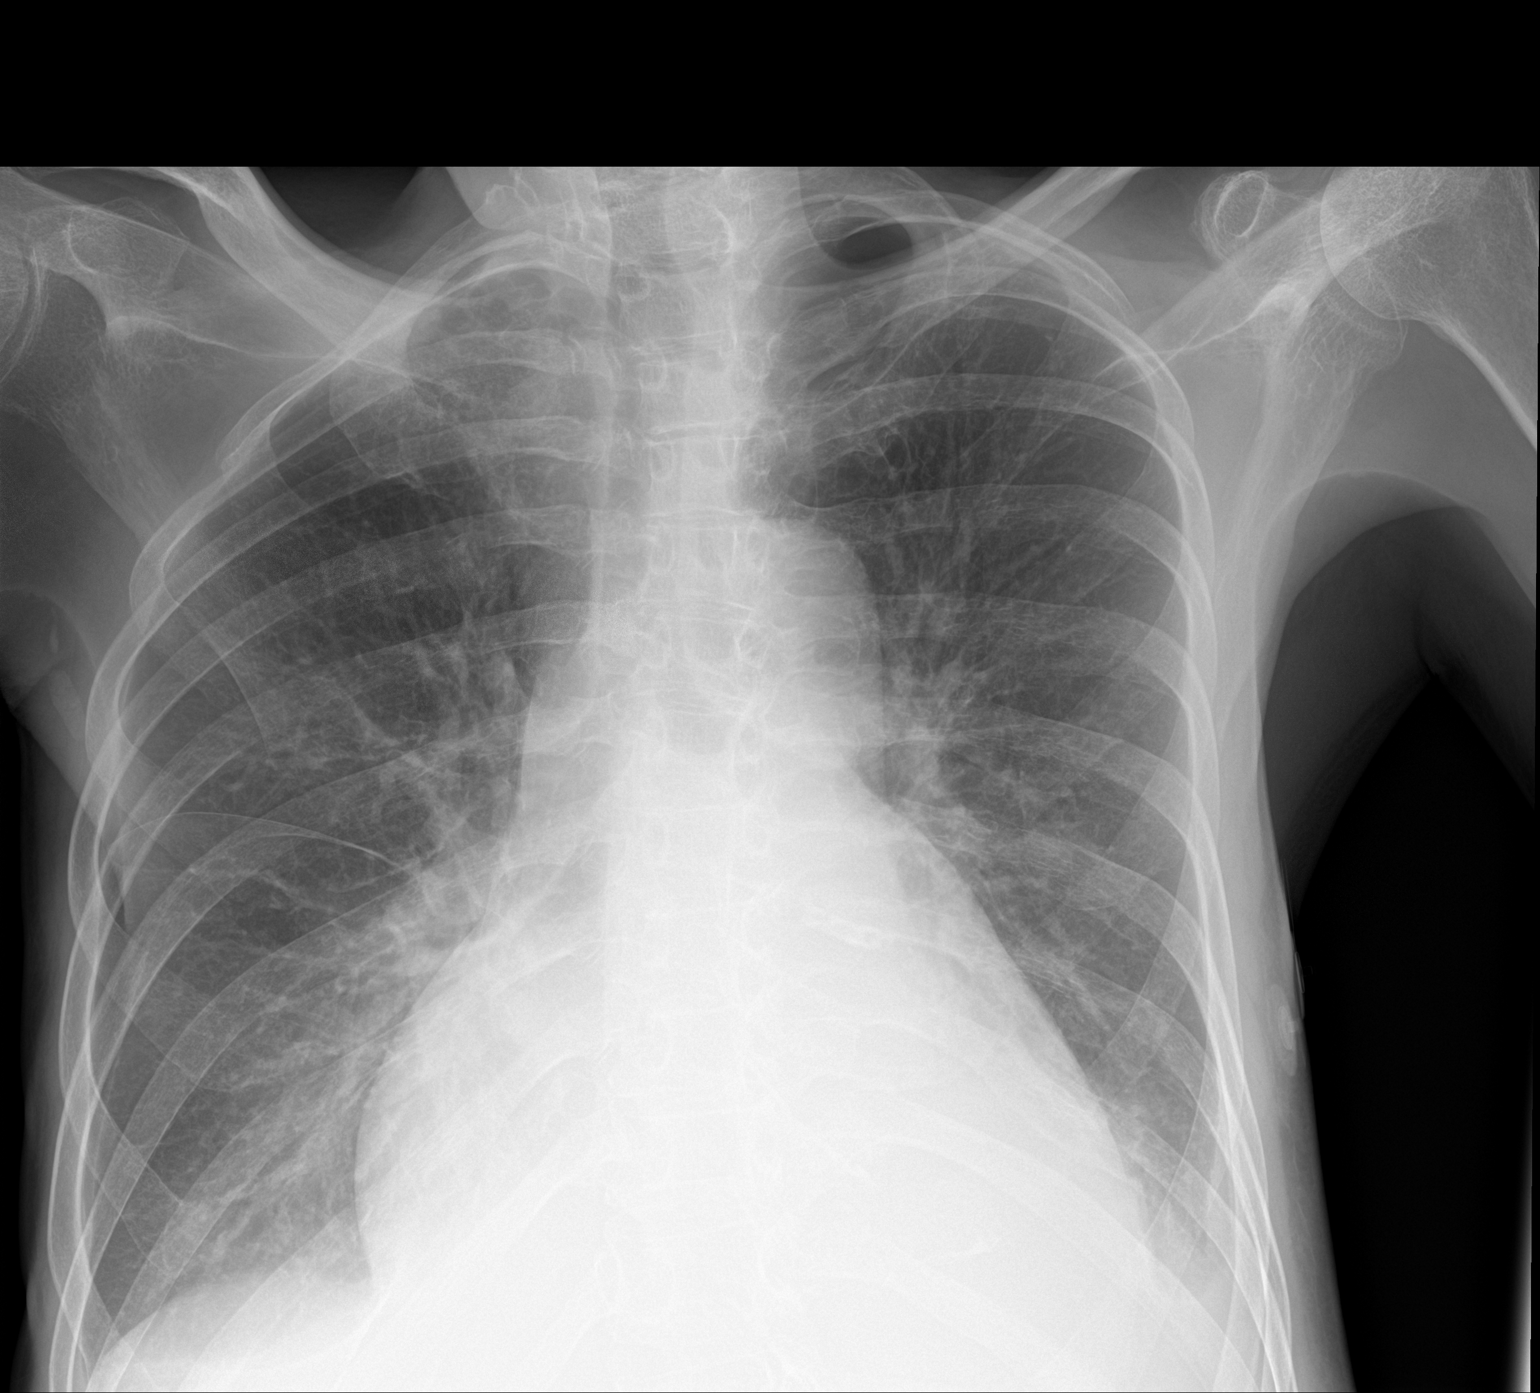

[2 of 2 positions shown; findings below may reference images not displayed]

FINDINGS: There are new moderate size dependent pleural effusions bilaterally
with associated bibasilar atelectasis. The heart is enlarged.
Coronary artery calcifications noted. There is vascular congestion
without overt pulmonary edema or confluent airspace opacity. The
bones appear unchanged.
IMPRESSION: 1. New pleural effusions with associated bibasilar atelectasis.
2. Cardiomegaly or pericardial effusion with mild vascular
congestion.
